# Patient Record
Sex: Female | Born: 1935 | Race: White | Hispanic: No | Smoking: Never smoker
Health system: Southern US, Community
[De-identification: ages and names within clinical notes are randomized; demographics above are authoritative.]

## PROBLEM LIST (undated history)

## (undated) DIAGNOSIS — J8410 Pulmonary fibrosis, unspecified: Secondary | ICD-10-CM

## (undated) DIAGNOSIS — F32A Depression, unspecified: Secondary | ICD-10-CM

## (undated) DIAGNOSIS — R51 Headache: Secondary | ICD-10-CM

## (undated) DIAGNOSIS — N183 Chronic kidney disease, stage 3 unspecified: Secondary | ICD-10-CM

## (undated) DIAGNOSIS — F0390 Unspecified dementia without behavioral disturbance: Secondary | ICD-10-CM

## (undated) DIAGNOSIS — E039 Hypothyroidism, unspecified: Secondary | ICD-10-CM

## (undated) DIAGNOSIS — E559 Vitamin D deficiency, unspecified: Secondary | ICD-10-CM

## (undated) DIAGNOSIS — R519 Headache, unspecified: Secondary | ICD-10-CM

## (undated) DIAGNOSIS — E538 Deficiency of other specified B group vitamins: Secondary | ICD-10-CM

## (undated) DIAGNOSIS — T7840XA Allergy, unspecified, initial encounter: Secondary | ICD-10-CM

## (undated) DIAGNOSIS — I10 Essential (primary) hypertension: Secondary | ICD-10-CM

## (undated) DIAGNOSIS — F329 Major depressive disorder, single episode, unspecified: Secondary | ICD-10-CM

## (undated) DIAGNOSIS — J449 Chronic obstructive pulmonary disease, unspecified: Secondary | ICD-10-CM

## (undated) DIAGNOSIS — G8929 Other chronic pain: Secondary | ICD-10-CM

## (undated) DIAGNOSIS — E785 Hyperlipidemia, unspecified: Secondary | ICD-10-CM

## (undated) HISTORY — DX: Headache, unspecified: R51.9

## (undated) HISTORY — DX: Unspecified dementia, unspecified severity, without behavioral disturbance, psychotic disturbance, mood disturbance, and anxiety: F03.90

## (undated) HISTORY — PX: APPENDECTOMY: SHX54

## (undated) HISTORY — DX: Chronic kidney disease, stage 3 (moderate): N18.3

## (undated) HISTORY — DX: Chronic obstructive pulmonary disease, unspecified: J44.9

## (undated) HISTORY — DX: Deficiency of other specified B group vitamins: E53.8

## (undated) HISTORY — DX: Hypothyroidism, unspecified: E03.9

## (undated) HISTORY — DX: Major depressive disorder, single episode, unspecified: F32.9

## (undated) HISTORY — DX: Essential (primary) hypertension: I10

## (undated) HISTORY — DX: Depression, unspecified: F32.A

## (undated) HISTORY — PX: THYROIDECTOMY: SHX17

## (undated) HISTORY — PX: TUBAL LIGATION: SHX77

## (undated) HISTORY — DX: Vitamin D deficiency, unspecified: E55.9

## (undated) HISTORY — DX: Chronic kidney disease, stage 3 unspecified: N18.30

## (undated) HISTORY — DX: Other chronic pain: G89.29

## (undated) HISTORY — DX: Pulmonary fibrosis, unspecified: J84.10

## (undated) HISTORY — DX: Headache: R51

## (undated) HISTORY — DX: Hyperlipidemia, unspecified: E78.5

## (undated) HISTORY — PX: BREAST BIOPSY: SHX20

## (undated) HISTORY — DX: Allergy, unspecified, initial encounter: T78.40XA

---

## 1996-01-30 HISTORY — PX: ENDOMETRIAL BIOPSY: SHX622

## 1997-05-12 ENCOUNTER — Other Ambulatory Visit: Admission: RE | Admit: 1997-05-12 | Discharge: 1997-05-12 | Payer: Self-pay | Admitting: *Deleted

## 1997-05-12 ENCOUNTER — Other Ambulatory Visit: Admission: RE | Admit: 1997-05-12 | Discharge: 1997-05-12 | Payer: Self-pay | Admitting: Internal Medicine

## 1998-02-25 ENCOUNTER — Encounter: Payer: Self-pay | Admitting: Urology

## 1998-02-25 ENCOUNTER — Ambulatory Visit (HOSPITAL_COMMUNITY): Admission: RE | Admit: 1998-02-25 | Discharge: 1998-02-25 | Payer: Self-pay | Admitting: Urology

## 1998-03-11 ENCOUNTER — Ambulatory Visit (HOSPITAL_COMMUNITY): Admission: RE | Admit: 1998-03-11 | Discharge: 1998-03-11 | Payer: Self-pay | Admitting: Neurology

## 1998-03-11 ENCOUNTER — Encounter: Payer: Self-pay | Admitting: Neurology

## 1998-04-05 ENCOUNTER — Ambulatory Visit: Admission: RE | Admit: 1998-04-05 | Discharge: 1998-04-05 | Payer: Self-pay | Admitting: Gynecology

## 1998-06-13 ENCOUNTER — Ambulatory Visit (HOSPITAL_COMMUNITY): Admission: RE | Admit: 1998-06-13 | Discharge: 1998-06-13 | Payer: Self-pay | Admitting: Internal Medicine

## 1998-06-13 ENCOUNTER — Encounter: Payer: Self-pay | Admitting: Internal Medicine

## 1998-06-15 ENCOUNTER — Ambulatory Visit (HOSPITAL_COMMUNITY): Admission: RE | Admit: 1998-06-15 | Discharge: 1998-06-15 | Payer: Self-pay | Admitting: Internal Medicine

## 1998-06-15 ENCOUNTER — Encounter: Payer: Self-pay | Admitting: Internal Medicine

## 1999-06-15 ENCOUNTER — Encounter: Payer: Self-pay | Admitting: Internal Medicine

## 1999-06-15 ENCOUNTER — Encounter: Admission: RE | Admit: 1999-06-15 | Discharge: 1999-06-15 | Payer: Self-pay

## 2000-05-26 ENCOUNTER — Inpatient Hospital Stay (HOSPITAL_COMMUNITY): Admission: EM | Admit: 2000-05-26 | Discharge: 2000-06-05 | Payer: Self-pay | Admitting: Emergency Medicine

## 2000-05-26 ENCOUNTER — Encounter: Payer: Self-pay | Admitting: Emergency Medicine

## 2000-08-08 ENCOUNTER — Ambulatory Visit (HOSPITAL_COMMUNITY): Admission: RE | Admit: 2000-08-08 | Discharge: 2000-08-08 | Payer: Self-pay | Admitting: Internal Medicine

## 2000-08-08 ENCOUNTER — Encounter: Payer: Self-pay | Admitting: Internal Medicine

## 2000-12-28 ENCOUNTER — Encounter: Payer: Self-pay | Admitting: Emergency Medicine

## 2000-12-28 ENCOUNTER — Emergency Department (HOSPITAL_COMMUNITY): Admission: EM | Admit: 2000-12-28 | Discharge: 2000-12-28 | Payer: Self-pay | Admitting: Emergency Medicine

## 2001-04-28 ENCOUNTER — Encounter: Payer: Self-pay | Admitting: Internal Medicine

## 2001-04-28 ENCOUNTER — Ambulatory Visit (HOSPITAL_COMMUNITY): Admission: RE | Admit: 2001-04-28 | Discharge: 2001-04-28 | Payer: Self-pay | Admitting: Internal Medicine

## 2001-05-08 ENCOUNTER — Ambulatory Visit (HOSPITAL_COMMUNITY): Admission: RE | Admit: 2001-05-08 | Discharge: 2001-05-08 | Payer: Self-pay | Admitting: Internal Medicine

## 2001-05-08 ENCOUNTER — Encounter: Payer: Self-pay | Admitting: Internal Medicine

## 2001-06-18 ENCOUNTER — Other Ambulatory Visit: Admission: RE | Admit: 2001-06-18 | Discharge: 2001-06-18 | Payer: Self-pay | Admitting: Internal Medicine

## 2001-08-19 ENCOUNTER — Encounter: Payer: Self-pay | Admitting: Internal Medicine

## 2001-08-19 ENCOUNTER — Ambulatory Visit (HOSPITAL_COMMUNITY): Admission: RE | Admit: 2001-08-19 | Discharge: 2001-08-19 | Payer: Self-pay | Admitting: Internal Medicine

## 2002-01-06 ENCOUNTER — Ambulatory Visit (HOSPITAL_COMMUNITY): Admission: RE | Admit: 2002-01-06 | Discharge: 2002-01-06 | Payer: Self-pay | Admitting: Internal Medicine

## 2002-01-06 ENCOUNTER — Encounter: Payer: Self-pay | Admitting: Internal Medicine

## 2002-03-24 ENCOUNTER — Encounter: Admission: RE | Admit: 2002-03-24 | Discharge: 2002-03-24 | Payer: Self-pay

## 2002-03-24 ENCOUNTER — Encounter: Payer: Self-pay | Admitting: Internal Medicine

## 2002-04-21 ENCOUNTER — Encounter: Payer: Self-pay | Admitting: Internal Medicine

## 2002-04-21 ENCOUNTER — Ambulatory Visit (HOSPITAL_COMMUNITY): Admission: RE | Admit: 2002-04-21 | Discharge: 2002-04-21 | Payer: Self-pay | Admitting: Internal Medicine

## 2002-09-26 ENCOUNTER — Encounter: Payer: Self-pay | Admitting: Emergency Medicine

## 2002-09-26 ENCOUNTER — Emergency Department (HOSPITAL_COMMUNITY): Admission: EM | Admit: 2002-09-26 | Discharge: 2002-09-26 | Payer: Self-pay | Admitting: Emergency Medicine

## 2002-09-27 ENCOUNTER — Inpatient Hospital Stay (HOSPITAL_COMMUNITY): Admission: RE | Admit: 2002-09-27 | Discharge: 2002-10-01 | Payer: Self-pay | Admitting: Gastroenterology

## 2002-09-28 ENCOUNTER — Encounter: Payer: Self-pay | Admitting: Internal Medicine

## 2002-09-28 ENCOUNTER — Encounter: Payer: Self-pay | Admitting: Gastroenterology

## 2002-09-29 ENCOUNTER — Encounter: Payer: Self-pay | Admitting: Internal Medicine

## 2002-09-30 ENCOUNTER — Encounter: Payer: Self-pay | Admitting: Gastroenterology

## 2002-10-01 ENCOUNTER — Encounter (INDEPENDENT_AMBULATORY_CARE_PROVIDER_SITE_OTHER): Payer: Self-pay | Admitting: Specialist

## 2002-10-01 DIAGNOSIS — B3781 Candidal esophagitis: Secondary | ICD-10-CM | POA: Insufficient documentation

## 2002-11-25 ENCOUNTER — Ambulatory Visit (HOSPITAL_COMMUNITY): Admission: RE | Admit: 2002-11-25 | Discharge: 2002-11-25 | Payer: Self-pay | Admitting: Internal Medicine

## 2002-11-25 ENCOUNTER — Encounter: Payer: Self-pay | Admitting: Gastroenterology

## 2002-11-25 ENCOUNTER — Encounter (INDEPENDENT_AMBULATORY_CARE_PROVIDER_SITE_OTHER): Payer: Self-pay | Admitting: Specialist

## 2003-04-20 ENCOUNTER — Ambulatory Visit (HOSPITAL_COMMUNITY): Admission: RE | Admit: 2003-04-20 | Discharge: 2003-04-20 | Payer: Self-pay | Admitting: Internal Medicine

## 2004-01-28 ENCOUNTER — Ambulatory Visit: Payer: Self-pay

## 2004-04-21 ENCOUNTER — Ambulatory Visit (HOSPITAL_COMMUNITY): Admission: RE | Admit: 2004-04-21 | Discharge: 2004-04-21 | Payer: Self-pay | Admitting: Internal Medicine

## 2004-05-22 ENCOUNTER — Ambulatory Visit: Payer: Self-pay | Admitting: Internal Medicine

## 2004-06-05 ENCOUNTER — Ambulatory Visit: Payer: Self-pay | Admitting: Internal Medicine

## 2004-06-07 ENCOUNTER — Encounter (INDEPENDENT_AMBULATORY_CARE_PROVIDER_SITE_OTHER): Payer: Self-pay | Admitting: *Deleted

## 2004-06-07 ENCOUNTER — Inpatient Hospital Stay (HOSPITAL_COMMUNITY): Admission: EM | Admit: 2004-06-07 | Discharge: 2004-06-09 | Payer: Self-pay | Admitting: Emergency Medicine

## 2004-06-08 ENCOUNTER — Ambulatory Visit: Payer: Self-pay | Admitting: Internal Medicine

## 2004-07-05 ENCOUNTER — Ambulatory Visit: Payer: Self-pay | Admitting: Internal Medicine

## 2004-08-04 ENCOUNTER — Ambulatory Visit (HOSPITAL_COMMUNITY): Admission: RE | Admit: 2004-08-04 | Discharge: 2004-08-04 | Payer: Self-pay | Admitting: Internal Medicine

## 2004-10-12 ENCOUNTER — Encounter (INDEPENDENT_AMBULATORY_CARE_PROVIDER_SITE_OTHER): Payer: Self-pay | Admitting: *Deleted

## 2004-10-12 ENCOUNTER — Ambulatory Visit (HOSPITAL_COMMUNITY): Admission: RE | Admit: 2004-10-12 | Discharge: 2004-10-12 | Payer: Self-pay | Admitting: Neurology

## 2004-12-08 ENCOUNTER — Emergency Department (HOSPITAL_COMMUNITY): Admission: EM | Admit: 2004-12-08 | Discharge: 2004-12-08 | Payer: Self-pay | Admitting: Emergency Medicine

## 2004-12-19 ENCOUNTER — Ambulatory Visit (HOSPITAL_COMMUNITY): Admission: RE | Admit: 2004-12-19 | Discharge: 2004-12-19 | Payer: Self-pay | Admitting: Internal Medicine

## 2005-02-11 ENCOUNTER — Emergency Department (HOSPITAL_COMMUNITY): Admission: EM | Admit: 2005-02-11 | Discharge: 2005-02-12 | Payer: Self-pay | Admitting: Emergency Medicine

## 2005-04-24 ENCOUNTER — Ambulatory Visit (HOSPITAL_COMMUNITY): Admission: RE | Admit: 2005-04-24 | Discharge: 2005-04-24 | Payer: Self-pay | Admitting: Internal Medicine

## 2005-05-10 ENCOUNTER — Encounter: Admission: RE | Admit: 2005-05-10 | Discharge: 2005-05-10 | Payer: Self-pay | Admitting: Internal Medicine

## 2005-06-05 ENCOUNTER — Ambulatory Visit: Payer: Self-pay | Admitting: Internal Medicine

## 2005-08-31 ENCOUNTER — Other Ambulatory Visit: Admission: RE | Admit: 2005-08-31 | Discharge: 2005-08-31 | Payer: Self-pay | Admitting: Internal Medicine

## 2005-12-03 ENCOUNTER — Ambulatory Visit (HOSPITAL_COMMUNITY): Admission: RE | Admit: 2005-12-03 | Discharge: 2005-12-03 | Payer: Self-pay | Admitting: Internal Medicine

## 2006-02-01 LAB — HM COLONOSCOPY

## 2006-06-05 ENCOUNTER — Ambulatory Visit (HOSPITAL_COMMUNITY): Admission: RE | Admit: 2006-06-05 | Discharge: 2006-06-05 | Payer: Self-pay | Admitting: Obstetrics

## 2006-06-20 ENCOUNTER — Ambulatory Visit: Payer: Self-pay | Admitting: Internal Medicine

## 2006-07-29 ENCOUNTER — Ambulatory Visit: Payer: Self-pay | Admitting: Internal Medicine

## 2006-12-20 ENCOUNTER — Ambulatory Visit: Payer: Self-pay | Admitting: Internal Medicine

## 2007-01-03 ENCOUNTER — Encounter: Payer: Self-pay | Admitting: Internal Medicine

## 2007-01-03 ENCOUNTER — Ambulatory Visit: Payer: Self-pay | Admitting: Internal Medicine

## 2007-01-03 DIAGNOSIS — K5730 Diverticulosis of large intestine without perforation or abscess without bleeding: Secondary | ICD-10-CM | POA: Insufficient documentation

## 2007-01-03 DIAGNOSIS — K620 Anal polyp: Secondary | ICD-10-CM | POA: Insufficient documentation

## 2007-01-03 DIAGNOSIS — K621 Rectal polyp: Secondary | ICD-10-CM

## 2007-01-03 LAB — HM COLONOSCOPY

## 2007-01-10 ENCOUNTER — Ambulatory Visit: Payer: Self-pay | Admitting: Gastroenterology

## 2007-01-10 LAB — CONVERTED CEMR LAB
Basophils Absolute: 0 10*3/uL (ref 0.0–0.1)
Eosinophils Relative: 3.3 % (ref 0.0–5.0)
HCT: 32.5 % — ABNORMAL LOW (ref 36.0–46.0)
Neutrophils Relative %: 48.7 % (ref 43.0–77.0)
RBC: 3.59 M/uL — ABNORMAL LOW (ref 3.87–5.11)
RDW: 12 % (ref 11.5–14.6)
WBC: 4.7 10*3/uL (ref 4.5–10.5)

## 2007-01-24 DIAGNOSIS — K6289 Other specified diseases of anus and rectum: Secondary | ICD-10-CM | POA: Insufficient documentation

## 2007-01-24 DIAGNOSIS — E782 Mixed hyperlipidemia: Secondary | ICD-10-CM

## 2007-01-24 DIAGNOSIS — K219 Gastro-esophageal reflux disease without esophagitis: Secondary | ICD-10-CM

## 2007-01-24 DIAGNOSIS — M216X9 Other acquired deformities of unspecified foot: Secondary | ICD-10-CM

## 2007-01-24 DIAGNOSIS — K589 Irritable bowel syndrome without diarrhea: Secondary | ICD-10-CM

## 2007-01-24 DIAGNOSIS — E039 Hypothyroidism, unspecified: Secondary | ICD-10-CM

## 2007-01-24 DIAGNOSIS — D34 Benign neoplasm of thyroid gland: Secondary | ICD-10-CM | POA: Insufficient documentation

## 2007-01-24 DIAGNOSIS — D012 Carcinoma in situ of rectum: Secondary | ICD-10-CM

## 2007-01-24 DIAGNOSIS — J45909 Unspecified asthma, uncomplicated: Secondary | ICD-10-CM | POA: Insufficient documentation

## 2007-01-24 DIAGNOSIS — I10 Essential (primary) hypertension: Secondary | ICD-10-CM

## 2007-02-06 ENCOUNTER — Ambulatory Visit: Payer: Self-pay | Admitting: Internal Medicine

## 2007-03-15 ENCOUNTER — Emergency Department (HOSPITAL_COMMUNITY): Admission: EM | Admit: 2007-03-15 | Discharge: 2007-03-15 | Payer: Self-pay | Admitting: Emergency Medicine

## 2007-03-15 ENCOUNTER — Encounter: Payer: Self-pay | Admitting: Critical Care Medicine

## 2007-03-23 ENCOUNTER — Emergency Department (HOSPITAL_COMMUNITY): Admission: EM | Admit: 2007-03-23 | Discharge: 2007-03-23 | Payer: Self-pay | Admitting: Emergency Medicine

## 2007-04-07 ENCOUNTER — Encounter: Payer: Self-pay | Admitting: Critical Care Medicine

## 2007-04-14 ENCOUNTER — Ambulatory Visit: Payer: Self-pay | Admitting: Critical Care Medicine

## 2007-04-14 ENCOUNTER — Encounter: Payer: Self-pay | Admitting: Critical Care Medicine

## 2007-04-14 ENCOUNTER — Inpatient Hospital Stay (HOSPITAL_COMMUNITY): Admission: AD | Admit: 2007-04-14 | Discharge: 2007-04-18 | Payer: Self-pay | Admitting: Critical Care Medicine

## 2007-04-14 DIAGNOSIS — J8410 Pulmonary fibrosis, unspecified: Secondary | ICD-10-CM

## 2007-04-15 ENCOUNTER — Encounter: Payer: Self-pay | Admitting: Critical Care Medicine

## 2007-04-16 ENCOUNTER — Encounter: Payer: Self-pay | Admitting: Critical Care Medicine

## 2007-04-16 ENCOUNTER — Telehealth: Payer: Self-pay | Admitting: Critical Care Medicine

## 2007-04-17 ENCOUNTER — Encounter: Payer: Self-pay | Admitting: Critical Care Medicine

## 2007-04-17 ENCOUNTER — Ambulatory Visit: Payer: Self-pay | Admitting: Gastroenterology

## 2007-04-18 ENCOUNTER — Telehealth (INDEPENDENT_AMBULATORY_CARE_PROVIDER_SITE_OTHER): Payer: Self-pay | Admitting: *Deleted

## 2007-04-24 ENCOUNTER — Telehealth: Payer: Self-pay | Admitting: Critical Care Medicine

## 2007-04-24 ENCOUNTER — Ambulatory Visit: Payer: Self-pay | Admitting: Critical Care Medicine

## 2007-04-24 DIAGNOSIS — E230 Hypopituitarism: Secondary | ICD-10-CM | POA: Insufficient documentation

## 2007-04-24 LAB — CONVERTED CEMR LAB
Calcium: 8.9 mg/dL (ref 8.4–10.5)
Creatinine, Ser: 0.6 mg/dL (ref 0.4–1.2)
GFR calc Af Amer: 125 mL/min
Glucose, Bld: 160 mg/dL — ABNORMAL HIGH (ref 70–99)
HCT: 36 % (ref 36.0–46.0)
Hemoglobin: 11.7 g/dL — ABNORMAL LOW (ref 12.0–15.0)
MCHC: 32.6 g/dL (ref 30.0–36.0)
Monocytes Absolute: 0.5 10*3/uL (ref 0.2–0.7)
Monocytes Relative: 7.1 % (ref 3.0–11.0)
Neutro Abs: 6.2 10*3/uL (ref 1.4–7.7)
RDW: 14.3 % (ref 11.5–14.6)
Sodium: 137 meq/L (ref 135–145)

## 2007-04-25 ENCOUNTER — Telehealth (INDEPENDENT_AMBULATORY_CARE_PROVIDER_SITE_OTHER): Payer: Self-pay | Admitting: *Deleted

## 2007-05-01 ENCOUNTER — Ambulatory Visit: Payer: Self-pay | Admitting: Endocrinology

## 2007-05-01 DIAGNOSIS — E236 Other disorders of pituitary gland: Secondary | ICD-10-CM | POA: Insufficient documentation

## 2007-05-12 ENCOUNTER — Encounter: Payer: Self-pay | Admitting: Endocrinology

## 2007-05-14 ENCOUNTER — Ambulatory Visit: Payer: Self-pay | Admitting: Critical Care Medicine

## 2007-05-19 ENCOUNTER — Encounter: Payer: Self-pay | Admitting: Critical Care Medicine

## 2007-05-22 ENCOUNTER — Ambulatory Visit: Payer: Self-pay | Admitting: Endocrinology

## 2007-06-18 ENCOUNTER — Ambulatory Visit: Payer: Self-pay | Admitting: Critical Care Medicine

## 2007-06-18 ENCOUNTER — Ambulatory Visit: Payer: Self-pay | Admitting: Endocrinology

## 2007-06-30 ENCOUNTER — Ambulatory Visit (HOSPITAL_COMMUNITY): Admission: RE | Admit: 2007-06-30 | Discharge: 2007-06-30 | Payer: Self-pay | Admitting: Internal Medicine

## 2007-08-06 ENCOUNTER — Ambulatory Visit: Payer: Self-pay | Admitting: Endocrinology

## 2007-08-06 LAB — CONVERTED CEMR LAB: Cortisol, Plasma: 8.2 ug/dL

## 2007-08-11 ENCOUNTER — Encounter: Payer: Self-pay | Admitting: Endocrinology

## 2007-08-11 ENCOUNTER — Telehealth (INDEPENDENT_AMBULATORY_CARE_PROVIDER_SITE_OTHER): Payer: Self-pay | Admitting: *Deleted

## 2007-08-11 ENCOUNTER — Telehealth: Payer: Self-pay | Admitting: Endocrinology

## 2007-08-18 ENCOUNTER — Telehealth: Payer: Self-pay | Admitting: Critical Care Medicine

## 2007-08-19 ENCOUNTER — Ambulatory Visit: Payer: Self-pay | Admitting: Critical Care Medicine

## 2007-10-14 ENCOUNTER — Ambulatory Visit: Payer: Self-pay | Admitting: Endocrinology

## 2007-10-14 LAB — CONVERTED CEMR LAB
BUN: 17 mg/dL (ref 6–23)
Calcium: 9 mg/dL (ref 8.4–10.5)
Creatinine, Ser: 0.7 mg/dL (ref 0.4–1.2)
GFR calc Af Amer: 105 mL/min
Glucose, Bld: 109 mg/dL — ABNORMAL HIGH (ref 70–99)
Sodium: 144 meq/L (ref 135–145)

## 2007-10-17 ENCOUNTER — Encounter: Admission: RE | Admit: 2007-10-17 | Discharge: 2007-10-17 | Payer: Self-pay | Admitting: Endocrinology

## 2007-12-10 ENCOUNTER — Ambulatory Visit: Payer: Self-pay | Admitting: Critical Care Medicine

## 2008-06-30 ENCOUNTER — Ambulatory Visit (HOSPITAL_COMMUNITY): Admission: RE | Admit: 2008-06-30 | Discharge: 2008-06-30 | Payer: Self-pay | Admitting: Internal Medicine

## 2008-07-28 ENCOUNTER — Encounter: Admission: RE | Admit: 2008-07-28 | Discharge: 2008-07-28 | Payer: Self-pay | Admitting: Internal Medicine

## 2008-07-28 ENCOUNTER — Encounter: Payer: Self-pay | Admitting: Internal Medicine

## 2008-08-11 ENCOUNTER — Ambulatory Visit: Payer: Self-pay | Admitting: Internal Medicine

## 2008-08-11 ENCOUNTER — Encounter (INDEPENDENT_AMBULATORY_CARE_PROVIDER_SITE_OTHER): Payer: Self-pay | Admitting: *Deleted

## 2008-08-11 ENCOUNTER — Inpatient Hospital Stay (HOSPITAL_COMMUNITY): Admission: EM | Admit: 2008-08-11 | Discharge: 2008-08-13 | Payer: Self-pay | Admitting: Emergency Medicine

## 2008-08-12 ENCOUNTER — Encounter: Payer: Self-pay | Admitting: Internal Medicine

## 2008-08-30 ENCOUNTER — Telehealth: Payer: Self-pay | Admitting: Internal Medicine

## 2008-08-30 ENCOUNTER — Telehealth (INDEPENDENT_AMBULATORY_CARE_PROVIDER_SITE_OTHER): Payer: Self-pay | Admitting: *Deleted

## 2008-09-17 ENCOUNTER — Ambulatory Visit: Payer: Self-pay | Admitting: Internal Medicine

## 2009-04-19 ENCOUNTER — Other Ambulatory Visit: Admission: RE | Admit: 2009-04-19 | Discharge: 2009-04-19 | Payer: Self-pay | Admitting: Internal Medicine

## 2009-07-05 ENCOUNTER — Ambulatory Visit (HOSPITAL_COMMUNITY): Admission: RE | Admit: 2009-07-05 | Discharge: 2009-07-05 | Payer: Self-pay | Admitting: Internal Medicine

## 2009-09-26 ENCOUNTER — Encounter: Admission: RE | Admit: 2009-09-26 | Discharge: 2009-09-26 | Payer: Self-pay | Admitting: Neurology

## 2010-01-29 HISTORY — PX: ESOPHAGOGASTRODUODENOSCOPY: SHX1529

## 2010-02-19 ENCOUNTER — Encounter: Payer: Self-pay | Admitting: Internal Medicine

## 2010-03-29 ENCOUNTER — Telehealth: Payer: Self-pay | Admitting: Internal Medicine

## 2010-03-30 ENCOUNTER — Ambulatory Visit: Payer: Self-pay | Admitting: Nurse Practitioner

## 2010-03-30 ENCOUNTER — Encounter: Payer: Self-pay | Admitting: Nurse Practitioner

## 2010-03-30 ENCOUNTER — Ambulatory Visit (INDEPENDENT_AMBULATORY_CARE_PROVIDER_SITE_OTHER): Payer: 59 | Admitting: Nurse Practitioner

## 2010-03-30 DIAGNOSIS — K5730 Diverticulosis of large intestine without perforation or abscess without bleeding: Secondary | ICD-10-CM

## 2010-03-30 DIAGNOSIS — K589 Irritable bowel syndrome without diarrhea: Secondary | ICD-10-CM

## 2010-03-30 DIAGNOSIS — D012 Carcinoma in situ of rectum: Secondary | ICD-10-CM

## 2010-03-30 DIAGNOSIS — K5900 Constipation, unspecified: Secondary | ICD-10-CM | POA: Insufficient documentation

## 2010-04-04 ENCOUNTER — Encounter: Payer: Self-pay | Admitting: Internal Medicine

## 2010-04-05 ENCOUNTER — Telehealth: Payer: Self-pay | Admitting: Internal Medicine

## 2010-04-06 ENCOUNTER — Encounter: Payer: Self-pay | Admitting: Internal Medicine

## 2010-04-06 NOTE — Progress Notes (Signed)
Summary: Triage   Phone Note Call from Patient Call back at Home Phone [phone]   Caller: Patient Call For: Dr. Juanda Chance Reason for Call: Talk to Nurse Summary of Call: Has an appt. on 04-17-10 and requesting sooner appt. Abd pain, said she has an intestinal blockage and its getting worse Initial call taken by: Karna Christmas,  March 29, 2010 11:11 AM  Follow-up for Phone Call        Patient calling to report abdominal pain for the last 2-3 weeks that is getting progessively worse. Patient states the pain is on her left side. Denies nausea, vomiting. Patient states when she goes to the bathroom, she has hard "balls" of stool. She is taking her Dexilant daily. She is not taking Prilosec. 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 2008- diverticular disease, last EGD 08/12/08- chronic gastritis. Patient has an appointment with Dr. Juanda Chance on 04/17/10 for her check up but would like to be seen earlier. Scheduled patient with Willette Cluster, RNP on 03/30/10 at 11:00 AM. Follow-up by: Jesse Fall RN,  March 29, 2010 11:37 AM  Additional Follow-up for Phone Call Additional follow up Details #1::        reviewed and agree. I know pt well. Chronic constipation , frail lady, does not do well with sigmoidoscopies. She will likely need a more vigorous laxative regimen, besides Mag Oxide pills. Additional Follow-up by: Hart Carwin MD,  March 29, 2010 6:37 PM

## 2010-04-11 NOTE — Assessment & Plan Note (Addendum)
Summary: Abdominal pain & constipation    History of Present Illness Visit Type: Follow-up Visit Primary GI MD: Lina Sar MD Primary Provider:  Lucky Cowboy, MD Requesting Provider: n/a Chief Complaint: LLQ pain and constipation constantly worsening symptoms since hospital visit History of Present Illness:    Patient is 77 year old white female known to Dr. Juanda Chance for a history of severe diverticular disease and a carcinoma in situ in a rectal polyp  in 2004.  Her last surveillance colonoscopy was in December 2008 with findings of tubulovillousmadenomatous rectal polyp. There was no high grade dysplasia.    Ms. Spurgin comes in with her son for evaluation of progressive constipation and left sided abdominal pain. It is uncomfortable to lay on left side. Her stools are hard little balls. She describes passage of very dark stool a couple of times over the last month. No nausea or  vomiting. Appetite and weight are fine.      GI Review of Systems    Reports abdominal pain.     Location of  Abdominal pain: LLQ.    Denies acid reflux, belching, bloating, chest pain, dysphagia with liquids, dysphagia with solids, heartburn, loss of appetite, nausea, vomiting, vomiting blood, weight loss, and  weight gain.      Reports black tarry stools and  constipation.     Denies anal fissure, change in bowel habit, diarrhea, diverticulosis, fecal incontinence, heme positive stool, hemorrhoids, irritable bowel syndrome, jaundice, light color stool, liver problems, rectal bleeding, and  rectal pain.    Current Medications (verified): 1)  Levoxyl 75 Mcg Tabs (Levothyroxine Sodium) .... Take 1 Tablet By Mouth Once Daily 2)  Verapamil Hcl Cr 120 Mg  Cp24 (Verapamil Hcl) .... Take 1/2 Two Times A Day 3)  Vitamin D 2000 Unit  Tabs (Cholecalciferol) .... Two Times A Day 4)  Fortical 200 Unit/act Nasal Soln (Calcitonin (Salmon)) .Marland Kitchen.. 1 Spray in 1 Nostril Daily 5)  Centrum Silver  Tabs (Multiple  Vitamins-Minerals) .Marland Kitchen.. 1 By Mouth Daily 6)  Cobal-1000 1000 Mcg/ml Soln (Cyanocobalamin) .Marland Kitchen.. 1 Injection Monthly 7)  Magnesium Oxide 400 Mg Tabs (Magnesium Oxide) .Marland Kitchen.. 1 By Mouth Three Times A Day 8)  Aricept 5 Mg Tabs (Donepezil Hcl) .... Take 1/2 Tablet At Bedtime 9)  Flax Seed Oil 1000 Mg Caps (Flaxseed (Linseed)) .... Once Daily 10)  Crestor 5 Mg Tabs (Rosuvastatin Calcium) .... Take 1 Tablet By Mouth Once Daily 11)  Benefiber  Powd (Wheat Dextrin) .... As Directed 12)  Citalopram Hydrobromide 40 Mg Tabs (Citalopram Hydrobromide) .... Take /12 Tablet By Mouth Once Daily 13)  Loratadine-Pseudoephedrine 10-240 Mg Xr24h-Tab (Loratadine-Pseudoephedrine) .... Once Daily  Allergies (verified): 1)  ! Codeine 2)  ! * Mobic 3)  ! Demerol  Past History:  Past Medical History: Asthma Diabetes, Type 2 Hyperlipidemia chronic headaches Hypertension Pulmonary fibrosis/chronic granulomatous changes on CT Chest , minimal mediastinal LAN 3/09 Carcinoma in situ in colon polyp 2004  Past Surgical History: Reviewed history from 09/14/2008 and no changes required. Appendectomy thyroidectomy Tubal Ligation Right Breast Biopsie-Benign  Family History: Reviewed history from 09/17/2008 and no changes required. Family History Asthma Family History Breast Cancer: Brother, Sister Family History COPD  Family History Lung Cancer Family History MI/Heart Attack brother expired of leukemia Family History of Diabetes: Sisters x 2, Mother, Maternal Aunt No FH of Colon Cancer:  Social History: Reviewed history from 09/14/2008 and no changes required. 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  The patient denies anemia, anxiety-new, back pain, blood in urine, breast changes/lumps, change in vision, cough, coughing up blood, depression-new,  fainting, fever, headaches-new, hearing problems, heart murmur, heart rhythm changes, itching, menstrual pain, muscle pains/cramps, night sweats, nosebleeds, pregnancy symptoms, shortness of breath, skin rash, sleeping problems, sore throat, swelling of feet/legs, swollen lymph glands, thirst - excessive , urination - excessive , urination changes/pain, urine leakage, vision changes, and voice change.    Vital Signs:  Patient profile:   77 year old female Height:      66 inches Weight:      138.25 pounds BMI:     22.39 Pulse rate:   72 / minute Pulse rhythm:   regular BP sitting:   158 / 72  (left arm) Cuff size:   regular  Vitals Entered By: June McMurray CMA Duncan Dull) (March 30, 2010 12:06 PM)  Physical Exam  General:  Thin, white female in no acute distress Head:  Normocephalic and atraumatic. Eyes:  Conjunctiva pink, no icterus.  Neck:  Conjunctiva pink, no icterus.  Lungs:  Clear throughout to auscultation. Heart:  RRR Abdomen:  Abdomen is soft, flat, normoactive bowel sounds. Mild LLQ tenderness. No masses felt.  Rectal:  No stool in vault. No masses felt. Gloved finger is heme negative. Msk:  Symmetrical with no gross deformities. Normal posture. Extremities:  1+ bilateral lower extremity edema. Neurologic:  Alert and  oriented x4;  grossly normal neurologically. Skin:  Intact without significant lesions or rashes. Cervical Nodes:  No significant cervical adenopathy. Psych:  Alert and cooperative. Normal mood and affect.   Impression & Recommendations:  Problem # 1:  IRRITABLE BOWEL SYNDROME (ICD-564.1) Assessment Deteriorated Problems with constipation as well as diarrhea in the past. She is currently constipated with LLQ discomfort and I suspect these are part of her IBS.Will do a slow bowel purge and see if LLQ discomfort resolves. If not, then will need further workup. Ms. Treadwell looks okay, her abdominal exam is not overly concerning. Once bowels are purged patient will  call with condition update.   Problem # 2:  DIVERTICULOSIS OF COLON (ICD-562.10) Assessment: Comment Only Severe.  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 Recurrent rectal polyp (tubulovillous adenoma without high grade dysplasia) in 2008. She is now due for repeat flexibe sigmoidoscopy but patient doesn't feel physically up to it right now.. She and Dr. Juanda Chance can revisit this when patient comes for recheck in a couple of weeks.  Patient Instructions: 1)  Please drink lots of water daily (several glasses). 2)  Please start Miralax 2 doses twice a day until you have loose stools then back down to once daily.  3)  Keep your appointment with Dr Juanda Chance on 04/17/10 2:15 pm. 4)  The medication list was reviewed and reconciled.  All changed / newly prescribed medications were explained.  A complete medication list was provided to the patient / caregiver.

## 2010-04-11 NOTE — Progress Notes (Addendum)
Summary: Abd Pain  Medications Added CIPROFLOXACIN HCL 250 MG TABS (CIPROFLOXACIN HCL) Take one tablet by mouth two times a day       Phone Note Call from Patient Call back at [phone] Work # for Daughter   Caller: KATRINA -Daughter Call For: Dr Juanda Chance Summary of Call: Is still having Abd Pain. Was told to call back if it had not subsided. Initial call taken by: Leanor Kail [facility],  April 05, 2010 8:31 AM  Follow-up for Phone Call        Spoke with patient. She saw Willette Cluster, RNP on 03/30/10  for abdominal pain and constipation. She is calling to report that she is still having LLQ abdominal pain. States it is a "bad ache that sometimes gets worse." States moving a certain way increases the pain. She cannot sleep on her left side. She is taking the Miralax once daily with results. She has 1-2 bowel movements/day that are not diarrhea but not real solid. She states sometimes the stool is a small amount and sometimes a larger amount. Denies fever, nausea or vomiting.  HX diverticular disease, carcinoma in situ in rectal poylp-2004. Last colon- Dec. 2008. Please, advise.  Follow-up by: Jesse Fall RN,  April 05, 2010 9:10 AM  Additional Follow-up for Phone Call Additional follow up Details #1::        Please call in Cipppppro 250 mg by mouth two times a day, #14, 1 by mouth two times a day. then call with an update. Additional Follow-up by: Hart Carwin MD,  April 05, 2010 9:51 PM    Additional Follow-up for Phone Call Additional follow up Details #2::    Pt.'s daughter returned call and said her mother could be reached on her cell [phone] or at [phone] Follow-up by: Karna Christmas,  April 06, 2010 8:40 AM  Additional Follow-up for Phone Call Additional follow up Details #3:: Details for Additional Follow-up Action Taken: Spoke with patient and gave her Dr.Ferdinando Lodge's recommendations. Rx sent to pharmacy. Patient to call with update after completing. Additional Follow-up by: Jesse Fall RN,  April 06, 2010 8:46 AM  New/Updated Medications: CIPROFLOXACIN HCL 250 MG TABS (CIPROFLOXACIN HCL) Take one tablet by mouth two times a day Prescriptions: CIPROFLOXACIN HCL 250 MG TABS (CIPROFLOXACIN HCL) Take one tablet by mouth two times a day  #14 x 0   Entered by:   Jesse Fall RN   Authorized by:   Hart Carwin MD   Signed by:   Jesse Fall RN on 04/06/2010   Method used:   Electronically to        [facility]  [address] # [id number]* (retail)       [address]       [place], [place]  [address]       Ph: [phone] or [phone]       Fax: [phone]   RxID:   [id number]   Appended Document: Abd Pain Spoke with patient and she is still having some LLQ pain, she states it is a little better. Still taking the Cipro. Has had some nausea and took Zofran for this. Did not acutally vomit. Instructed patient to take the Cipro until finished and to keep her appointment with Dr. Juanda Chance on 04/17/10  Appended Document: Abd Pain reviewed and agree.

## 2010-04-17 ENCOUNTER — Ambulatory Visit (INDEPENDENT_AMBULATORY_CARE_PROVIDER_SITE_OTHER): Payer: Medicare Other | Admitting: Internal Medicine

## 2010-04-17 ENCOUNTER — Encounter: Payer: Self-pay | Admitting: Internal Medicine

## 2010-04-17 ENCOUNTER — Telehealth: Payer: Self-pay | Admitting: Internal Medicine

## 2010-04-17 DIAGNOSIS — K5730 Diverticulosis of large intestine without perforation or abscess without bleeding: Secondary | ICD-10-CM

## 2010-04-17 DIAGNOSIS — R1032 Left lower quadrant pain: Secondary | ICD-10-CM

## 2010-04-18 NOTE — Letter (Signed)
Summary: [facility]   Imported By: Lamona Curl CMA (AAMA) 04/11/2010 13:56:20  _____________________________________________________________________  External Attachment:    Type:   Image     Comment:   External Document

## 2010-04-18 NOTE — Letter (Signed)
Summary: CT ABD/[facility]  CT ABD/[facility]   Imported By: Lamona Curl CMA (AAMA) 04/11/2010 13:54:53  _____________________________________________________________________  External Attachment:    Type:   Image     Comment:   External Document

## 2010-04-26 ENCOUNTER — Ambulatory Visit (AMBULATORY_SURGERY_CENTER): Payer: Medicare Other | Admitting: *Deleted

## 2010-04-26 VITALS — Ht 66.5 in | Wt 138.0 lb

## 2010-04-26 DIAGNOSIS — K5730 Diverticulosis of large intestine without perforation or abscess without bleeding: Secondary | ICD-10-CM

## 2010-04-26 DIAGNOSIS — R1032 Left lower quadrant pain: Secondary | ICD-10-CM

## 2010-04-27 NOTE — Assessment & Plan Note (Signed)
Summary: ABD PAIN..[facility] PT//CX POL ADVISED.Marland Kitchenpapers in np file!    History of Present Illness Visit Type: Follow-up Visit Primary GI MD: Lina Sar MD Primary Provider:  Lucky Cowboy, MD Requesting Provider: n/a Chief Complaint: Pt c/o left side abd pain that radiates to her back, diarrhea and constipation  History of Present Illness:   This is a 77 year old white female with chronic left lower quadrant abdominal pain exacerbated in the last several weeks. She has not responded to Cipro and Flagyl. A CT scan of the abdomen and pelvis shows diverticulosis in the left lower quadrant without any evidence of inflammatory changes. Incidental gastric thickening was noted in the fundus of the stomach worrisome for an infiltrative process. She has been complaining of black stools.  She had an upper endoscopy in July 2010 with findings of  H. pylori negative gastritis. She has lost weight. She denies rectal bleeding. She was initially constipated and we put her on MiraLax. Now she is having diarrhea so she stopped her MiraLax and magnesium oxide because she is having accidents. Prior colonoscopies and endoscopies showed postprocedure complications of pelvic pain requiring several prior hospitalizations. We have attributed the complications to either pelvic adhesions or severe scarring of the left colon. She has a history of carcinoma in situ in a rectal polyp in 2004. Her last colonoscopy in December 2008 showed a tubulovillous adenoma.   GI Review of Systems    Reports abdominal pain.     Location of  Abdominal pain: left side.    Denies acid reflux, belching, bloating, chest pain, dysphagia with liquids, dysphagia with solids, heartburn, loss of appetite, nausea, vomiting, vomiting blood, weight loss, and  weight gain.      Reports constipation and  diarrhea.     Denies anal fissure, black tarry stools, change in bowel habit, diverticulosis, fecal incontinence, heme positive stool, hemorrhoids,  irritable bowel syndrome, jaundice, light color stool, liver problems, rectal bleeding, and  rectal pain.    Current Medications (verified): 1)  Levoxyl 75 Mcg Tabs (Levothyroxine Sodium) .... Take 1 Tablet By Mouth Once Daily 2)  Verapamil Hcl Cr 120 Mg  Cp24 (Verapamil Hcl) .... Take 1/2 Two Times A Day 3)  Vitamin D 2000 Unit  Tabs (Cholecalciferol) .... Two Times A Day 4)  Fortical 200 Unit/act Nasal Soln (Calcitonin (Salmon)) .Marland Kitchen.. 1 Spray in 1 Nostril Daily 5)  Centrum Silver  Tabs (Multiple Vitamins-Minerals) .Marland Kitchen.. 1 By Mouth Daily 6)  Cobal-1000 1000 Mcg/ml Soln (Cyanocobalamin) .Marland Kitchen.. 1 Injection Monthly 7)  Magnesium Oxide 400 Mg Tabs (Magnesium Oxide) .Marland Kitchen.. 1 By Mouth Three Times A Day 8)  Aricept 5 Mg Tabs (Donepezil Hcl) .... Take 1/2 Tablet At Bedtime 9)  Flax Seed Oil 1000 Mg Caps (Flaxseed (Linseed)) .... One Capsule By Mouth Two Times A Day 10)  Crestor 5 Mg Tabs (Rosuvastatin Calcium) .... Take 1 Tablet By Mouth Once Daily 11)  Benefiber  Powd (Wheat Dextrin) .... As Directed 12)  Citalopram Hydrobromide 40 Mg Tabs (Citalopram Hydrobromide) .... Take /12 Tablet By Mouth Once Daily 13)  Loratadine-Pseudoephedrine 10-240 Mg Xr24h-Tab (Loratadine-Pseudoephedrine) .... Once Daily 14)  Aspir-Low 81 Mg Tbec (Aspirin) .... One Tablet By Mouth Once Daily  Allergies (verified): 1)  ! Codeine 2)  ! * Mobic 3)  ! Demerol  Past History:  Past Medical History: Asthma Diabetes, Type 2 Hyperlipidemia Chronic Headaches Hypertension Pulmonary fibrosis/chronic granulomatous changes on CT Chest , minimal mediastinal LAN 3/09 Carcinoma in situ in colon polyp 2004 Hypothyroidism  Diverticulosis GERD  Past Surgical History: Reviewed history from 09/14/2008 and no changes required. Appendectomy thyroidectomy Tubal Ligation Right Breast Biopsie-Benign  Family History: Reviewed history from 09/17/2008 and no changes required. 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  The patient denies allergy/sinus, anemia, anxiety-new, arthritis/joint pain, blood in urine, breast changes/lumps, change in vision, confusion, cough, coughing up blood, depression-new, fainting, fatigue, fever, headaches-new, hearing problems, heart murmur, heart rhythm changes, itching, menstrual pain, muscle pains/cramps, night sweats, nosebleeds, pregnancy symptoms, shortness of breath, skin rash, sleeping problems, sore throat, swelling of feet/legs, swollen lymph glands, thirst - excessive , urination - excessive , urination changes/pain, urine leakage, vision changes, and voice change.         Pertinent positive and negative review of systems were noted in the above HPI. All other ROS was otherwise negative.   Vital Signs:  Patient profile:   77 year old female Height:      66 inches Weight:      138 pounds BMI:     22.35 BSA:     1.71 Pulse rate:   76 / minute Pulse rhythm:   regular BP sitting:   132 / 68  (left arm) Cuff size:   regular  Vitals Entered By: Ok Anis CMA (April 17, 2010 10:01 AM)  Physical Exam  General:  Well developed, well nourished, no acute distress. Eyes:  PERRLA, no icterus. Mouth:  No deformity or lesions, dentition normal. Abdomen:   soft abdomen with normal active bowel sounds. No distention. Marked tenderness along sigmoid colon without rebound or fullness. Epigastrium and right upper quadrant are unremarkable Rectal:   normal rectal sphincter tone with the very small amount of Hemoccult negative stool Extremities:  No clubbing, cyanosis, edema or deformities  noted. Skin:  Intact without significant lesions or rashes. Psych:  Alert and cooperative. Normal mood and affect.   Impression & Recommendations:  Problem # 1:  DIVERTICULOSIS OF COLON (ICD-562.10) Patient has severe symptomatic diverticulosis of the left colon but prior episodes of pelvic pain were attributed to procedures specifically flexible sigmoidoscopies and colonoscopies which were done for the purpose of surveillance for colon polyps. Although there are no inflammatory changes in the left colon, her left colon remains nonfunctioning. I would treat her conservatively because she would be a high risk for sigmoid resection. She will continue on Benefiber one tablespoon daily and continue her on antispasmodics given to her by Dr.McKeown. She will continue on a soft diet. She will complete her antibiotics. I will hold off on a flexible sigmoidoscopy due to prior experience with the postprocedure complications.  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 Her last examine was in December 2008. She is Hemoccult-negative.  Problem # 3:  NONSPECIFIC ABN FINDING RAD & OTH EXAM GI TRACT (ICD-793.4) Patient has thickening of the gastric fundus worrisome for infiltrative process. We will schedule her for an upper endoscopy.  Patient Instructions: 1)  You have been scheduled for an upper endoscopy to evaluate abnormality on the CT scan . You are  scheduled for Thursday 05/04/10 @ 3 pm. Someone who is 4 years of age or older should be with you for this entire procedure. 2)  You have been scheduled for a previsit with a nurse to go over endoscopy instructions. You are scheduled for Wednesday 04/26/10 @ 3:30 pm. You should come to [facility] 3rd floor for this. You should have someone who is 4 or older with you for this visit. 3)  Please discontinue magnesium oxide and MiraLax. 4)  Continue antispasmodic medication given by Dr. Oneta Rack. 5)  We have  sent a prescription for Tramadol 50 mg take 1 tablet every 6-8 hours as needed for abdominal pain to your pharmacy. 6)  Continue Benefiber 2 teaspoons daily. 7)  Copy sent to : Dr Oneta Rack 8)  The medication list was reviewed and reconciled.  All changed / newly prescribed medications were explained.  A complete medication list was provided to the patient / caregiver. Prescriptions: TRAMADOL HCL 50 MG TABS (TRAMADOL HCL) Take 1 tablet by mouth every 6-8 hours as needed for pain  #30 x 0   Entered by:   Lamona Curl CMA (AAMA)   Authorized by:   Hart Carwin MD   Signed by:   Lamona Curl CMA (AAMA) on 04/17/2010   Method used:   Electronically to        [facility] # [id number]* (retail)       [address]       [place], [place]  [address]       Ph: [phone] or [phone]       Fax: [phone]   RxID:   [id number]

## 2010-04-27 NOTE — Progress Notes (Signed)
Summary: Triage   Phone Note Call from Patient   Caller: Boyd Kerbs Call For: Dr. Juanda Chance Reason for Call: Talk to Nurse Summary of Call: [facility] nurse @ Dr. Michaelle Birks office is calling to speak with [facility] nurse about this patient, says the patient is very confused and she wants to make sure we get accurate information [phone] Initial call taken by: [place] Johnson,  April 17, 2010 9:26 AM  Follow-up for Phone Call        Dr. Juanda Chance spoke with Boyd Kerbs Follow-up by: Jesse Fall RN,  April 17, 2010 10:33 AM

## 2010-04-28 ENCOUNTER — Encounter: Payer: Self-pay | Admitting: Internal Medicine

## 2010-05-03 ENCOUNTER — Encounter: Payer: Self-pay | Admitting: Internal Medicine

## 2010-05-04 ENCOUNTER — Ambulatory Visit (AMBULATORY_SURGERY_CENTER): Payer: Medicare Other | Admitting: Internal Medicine

## 2010-05-04 ENCOUNTER — Encounter: Payer: Self-pay | Admitting: Internal Medicine

## 2010-05-04 DIAGNOSIS — R1033 Periumbilical pain: Secondary | ICD-10-CM

## 2010-05-04 DIAGNOSIS — R6889 Other general symptoms and signs: Secondary | ICD-10-CM

## 2010-05-04 DIAGNOSIS — R933 Abnormal findings on diagnostic imaging of other parts of digestive tract: Secondary | ICD-10-CM

## 2010-05-04 DIAGNOSIS — K2970 Gastritis, unspecified, without bleeding: Secondary | ICD-10-CM

## 2010-05-04 DIAGNOSIS — R109 Unspecified abdominal pain: Secondary | ICD-10-CM

## 2010-05-04 DIAGNOSIS — R9389 Abnormal findings on diagnostic imaging of other specified body structures: Secondary | ICD-10-CM

## 2010-05-04 DIAGNOSIS — K219 Gastro-esophageal reflux disease without esophagitis: Secondary | ICD-10-CM

## 2010-05-04 DIAGNOSIS — K2990 Gastroduodenitis, unspecified, without bleeding: Secondary | ICD-10-CM

## 2010-05-04 MED ORDER — SODIUM CHLORIDE 0.9 % IV SOLN: 500.0000 mL | INTRAVENOUS | Status: DC

## 2010-05-04 NOTE — Patient Instructions (Signed)
See blue and green sheets for additional d/c instructions 

## 2010-05-07 LAB — URINALYSIS, ROUTINE W REFLEX MICROSCOPIC
Glucose, UA: NEGATIVE mg/dL
Ketones, ur: NEGATIVE mg/dL
Protein, ur: NEGATIVE mg/dL

## 2010-05-07 LAB — GLUCOSE, CAPILLARY: Glucose-Capillary: 116 mg/dL — ABNORMAL HIGH (ref 70–99)

## 2010-05-07 LAB — CBC
Hemoglobin: 12.8 g/dL (ref 12.0–15.0)
Platelets: 199 10*3/uL (ref 150–400)
RDW: 12.4 % (ref 11.5–15.5)

## 2010-05-07 LAB — COMPREHENSIVE METABOLIC PANEL
ALT: 22 U/L (ref 0–35)
Albumin: 4.1 g/dL (ref 3.5–5.2)
Alkaline Phosphatase: 60 U/L (ref 39–117)
Glucose, Bld: 165 mg/dL — ABNORMAL HIGH (ref 70–99)
Potassium: 3.3 mEq/L — ABNORMAL LOW (ref 3.5–5.1)
Sodium: 140 mEq/L (ref 135–145)
Total Protein: 7.3 g/dL (ref 6.0–8.3)

## 2010-05-07 LAB — DIFFERENTIAL
Basophils Relative: 0 % (ref 0–1)
Eosinophils Absolute: 0 10*3/uL (ref 0.0–0.7)
Monocytes Absolute: 1 10*3/uL (ref 0.1–1.0)
Monocytes Relative: 10 % (ref 3–12)

## 2010-05-07 LAB — URINE MICROSCOPIC-ADD ON

## 2010-05-08 ENCOUNTER — Telehealth: Payer: Self-pay

## 2010-05-08 NOTE — Telephone Encounter (Signed)

## 2010-05-11 ENCOUNTER — Encounter: Payer: Self-pay | Admitting: Internal Medicine

## 2010-06-13 NOTE — Assessment & Plan Note (Signed)
[facility]                         GASTROENTEROLOGY OFFICE NOTE   NAME:Gilmore, [name] [name]                       MRN:          [id number]  DATE:07/29/2006                            DOB:          Feb 14, 1929    [name] [name] is a 77 year old white female with a history of carcinoma in  situ and a rectal polyp in August 2004.  She has since then had yearly  flexible sigmoidoscopy and colonoscopy with findings of  adenomatous__________ polyp in the rectum.  Her last resection date was  August 2006.  Unfortunately, the patient has had difficult colonoscopic  exams and had a lot of pain after the procedure with ileus and  peritoneal like signs necessitating hospitalization for observation.  It  is not clear why this occurred even with a pediatric scope on the last  occasion.  But, there is a very sharp turn of the colon at the pelvic  rim which is always difficult to negotiate.  For this reason, we have  skipped last year and avoided flexible sigmoidoscopy.  She was seen  recently for rectal bleeding and on anoscopic exam had a thromboses  hemorrhoid.  This was treated conservatively with Anusol suppositories  and topical steroids.  She comes today with the complete resolution of  these symptoms.   MEDICATIONS:  1. Alendronate 75 mg weekly.  2. Aspirin 81 mg p.o. daily.  3. Multivitamin.  4. Levoxyl 88 mcg, half tablet daily.  5. Loratadine 10 mg p.o. daily.  6. Metformin 500 mg, four tablets a day.  7. Pravastatin 40 mg p.o. daily.  8. Verapamil 120 mg q.a.m. and one h.s.   PHYSICAL EXAMINATION:  VITAL SIGNS:  Blood pressure 122/70, pulse 88,  weight __________ pounds.  The patient was not re-examined.   IMPRESSION:  1. Status post thrombosed hemorrhoid, now asymptomatic.  2. History of adenomatous polyp of the rectum, status post multiple      ablations in the past.  3. Prior colonoscopy complicated by abdominal pain necessitating a  hospitalization.   PLAN:  1. The patient will need a followup colonoscopy to make sure that the      polyp at the rectum has not re-grown.  She already had in the past      surgical consultation with the surgeon and elected not to pursue      excision of this polyp.  We will schedule her for followup      colonoscopy for January 2009, which will be 2.5 years from the last      colonoscopy.  2. She is to continue on a high fiber diet.  3. Call us if the bleeding recurs.     Hedwig Morton. Juanda Chance, MD  Electronically Signed    DMB/MedQ  DD: 07/29/2006  DT: 07/29/2006  Job #: [id number]   cc:   Lucky Cowboy, M.D.

## 2010-06-13 NOTE — Consult Note (Signed)
NAMEJACORIA, [name] [name]                ACCOUNT NO.:  [id number]   MEDICAL RECORD NO.:  [id number]          PATIENT TYPE:  INP   LOCATION:  3009                         FACILITY:  [facility]   PHYSICIAN:  Gaspar Garbe, M.D.DATE OF BIRTH:  29-Jan-1930   DATE OF CONSULTATION:  DATE OF DISCHARGE:                                 CONSULTATION   REFERRING PHYSICIAN:  Charlcie Cradle. Delford Field, MD.   CHIEF COMPLAINT:  Request per Dr. Shan Levans, pituitary changes on  MRI.   HISTORY OF PRESENT ILLNESS:  The patient is a 77 year old white female  with a history of pulmonary disease and currently being worked up for  pulmonary fibrosis and granulomatous disease, who also has had recent  low blood counts requiring transfusion, underwent bronchoscopy yesterday  with cultures and pathology performed as well as an EGD earlier today as  part of a workup for headaches.  The patient has been seen by neurology.  An MRI of her brain with contrast was ordered, which showed some  pituitary enlargement but did not show any impingement on her optic  chiasm.  I was asked to render an impression regarding the pituitary  enlargement and to perform any endocrine laboratory testing if  warranted.   The patient indicates that she has a history of migraines which tended  to get better after her menses but that she has had headaches on and off  over the past several months, for which she has mostly used Tylenol and  other over-the-counter type medicines.  It seems to be mostly behind her  eyes, but she has not noticed any visual disturbances or changes.  She  denies any new or any chronic-onset balance issues, has been otherwise  in a reasonably good state of health prior to her hospitalization.  The  patient indicates that she has been on Levoxyl for approximately 15  years following the removal of a portion of her thyroid gland.   ALLERGIES:  MOBIC and CODEINE.   MEDICATIONS:  1. Fosamax.  2. Advair twice  daily.  3. Levoxyl 88 mcg per day.  4. Amaryl 4 mg a.c.  5. Lantus insulin 5 units subcutaneous with sliding scale insulin.  6. Prednisone 20 mg per day.  7. Zocor 20 mg per day.   PAST MEDICAL HISTORY:  1. Chronic granulomatous disease with some fibrosis noted on CT scan      of her chest.  The patient is currently being worked up for this.  2. Iron-deficiency anemia.  3. Hypertension.  4. Chronic headaches.  5. Hyperlipidemia.  6. Diabetes mellitus type 2.  7. Hypothyroidism with a history of goiter.   PAST SURGICAL HISTORY:  The patient had thyroidectomy in 1991 and  appendectomy.   SOCIAL HISTORY:  The patient lives in [place].  She is a retired Scientist, water quality and does not have a smoking history.   FAMILY HISTORY:  Asthma, breast cancer, COPD, lung cancer and leukemias.   REVIEW OF SYSTEMS:  The patient denies any fevers or chills although her  daughter says that her temperature, although marked as normal, is  approximately 1 degree higher than what her usual is at home.  She notes  some right frontal headache at this time and some nasal discharge.  Is  not having any visual changes or hearing loss.  Denies any chest pain,  shortness of breath, and is able to speak in full sentences and breathe  off of oxygen with her mouth shut and is able to eat without difficulty.  She notes generalized weakness but no nausea, vomiting or diarrhea at  present.  Review of systems is otherwise negative for a 10-point scale.   PHYSICAL EXAM:  Temperature 98, pulse 71, respiratory rate 16,  saturating 98% on room air, blood pressure 135/66.  GENERAL:  No acute distress.  Very pleasant woman.  HEENT:  Normocephalic, atraumatic.  PERRLA.  EOMI.  ENT within normal  limits.  NECK:  Supple.  No lymphadenopathy, JVD or bruit.  HEART:  Regular rate and rhythm.  No murmur, rub or gallop are  appreciated.  LUNGS:  Clear to auscultation bilaterally.  ABDOMEN:  Soft, nontender, normoactive bowel  sounds.  No  hepatosplenomegaly.  EXTREMITIES:  No clubbing, cyanosis or edema.  MUSCULOSKELETAL:  No joint deformities noted.  NEUROLOGIC:  The patient is oriented to person, place and time.  She is  somewhat hard of hearing.  Cranial nerve exam II-XII is grossly intact  with 5/5 strength and normal sensation bilaterally.   IMAGING:  The patient has an MRI of her brain which shows a Prominent  pituitary.  Rough measurement is less than 10 mm in size.   LABS:  White count 7.8, hemoglobin 7.4, hematocrit 22.1, being  transfused, platelets 418.  BUN and creatinine of 13 and 0.6,  respectively.  Potassium normalized at 3.6, had previously been low.  TSH normal at 1.8324, Free T4 8.6, T3 uptake is slightly increased.  Cortisol level is 3.6.  ACE level is currently pending.   ASSESSMENT/PLAN:  1. Pituitary enlargement.  Differential includes pituitary adenoma      versus an incidentaloma.  I think it is most likely the latter.  I      am fairly unimpressed with the size of her pituitary on MRI and it      is certainly not the cause of her headaches as there is no evidence      of shift or impingement.  We will work it up from an endocrinologic      standpoint by adding a prolactin, ACTH and insulin growth factor I      to rule out prolactinoma and acromegaly.  She has not had any      production of milk from her breasts nor has she had any frontal      bossing or other changes consistent with that.  I think her      endocrine workup will most likely be negative.  If this is the      case, she warrants MRI repeat in 6 months and if this is negative,      it is most likely just simply an incidentaloma or related to the      type of imaging.  I do not feel it has any bearing on her overall      hospital course at this point.  We will continue to follow her      while she is in-house and discuss the results as they come back.  2. Anemia.  Workup currently pending per GI and pulmonary  critical  care.  3. Pulmonary fibrosis, granuloma.  Pathology currently pending.  It      seems as if a fungal workup is underway at this point, although an      exact cause is not known.  4. Hypothyroidism.  She has a normal TSH and free T4.  T3 uptake has      to be the least reliable of these sets of labs.  With the      normalized TSH and free T4, I would not change her dose of Levoxyl.  5. Chronic steroid dependence.  The patient is currently on 20 mg      prednisone, which would most likely cause her to have a decreased      cortisol.  This may make her workup a little bit more difficult,      but she does not have any sequelae of Cushing's clinically.  If      further workup is necessary, this will need to be done with her off      of steroid or a different kind of steroid with a 24-hour urine      cortisol performed.  However, as stated above, I believe this is      most likely an incidentaloma.   Thank you for allowing me to consult on patient [name] [name].      Gaspar Garbe, M.D.  Electronically Signed     RWT/MEDQ  D:  04/17/2007  T:  04/18/2007  Job:  [id number]   cc:   Charlcie Cradle. Delford Field, MD, FCCP

## 2010-06-13 NOTE — Discharge Summary (Signed)
[name] [name], [name] [name]                ACCOUNT NO.:  [id number]   MEDICAL RECORD NO.:  [id number]          PATIENT TYPE:  INP   LOCATION:  3009                         FACILITY:  [facility]   PHYSICIAN:  Charlcie Cradle. Delford Field, MD, FCCPDATE OF BIRTH:  Apr 30, 1929   DATE OF ADMISSION:  04/14/2007  DATE OF DISCHARGE:  04/18/2007                               DISCHARGE SUMMARY   DISCHARGE DIAGNOSES:  1. Pulmonary fibrosis with negative fibrotic bronchoscopy.  2. Headache.  3. Pituitary enlargement.  4. Rule out gastrointestinal blood loss.  5. Acute blood loss anemia.  6. Steroid exacerbation diabetes mellitus.  7. Relative low cortisol level.   HISTORY OF PRESENT ILLNESS:  [name] [name] is a 77 year old white  female patient of Dr. Shan Levans.  She is seen in the office and  admitted to the hospital for failure to thrive, fever, shortness of  breath, and episodes of epistaxis.  The patient was initially evaluated  on March 15, 2007, and noted to have a temperature of 104 degrees.  She had a productive cough of thick gray mucus.  This developed  increasing shortness of breath.  There was also a component of weight  loss; therefore, she was admitted for further evaluation and treatment.   LABORATORY DATA:  Pathology of fiberoptic bronchoscopy with biopsy shows  nonspecific chronic inflammatory changes.  MRI of the brain demonstrates  no acute infarct, no intracranial hemorrhage, prominent appearance of  pituitary gland as discussed, marked white matter type changes probably  related to small vessel disease, and no abnormal intracranial enhancing  lesions.  Chest x-ray demonstrates no acute disease of bilateral  pulmonary hyperexpansion noted.  CT of the head demonstrates significant  nonspecific white matter type changes probably related to small vessel  disease.  No hemorrhage or mass or lesion is noted.  Hemoglobin went  from a low of 7.4 to a high of 11.4, hematocrit 33.5, platelets  435, WBC  is 8.6.  Sodium 140, potassium 3.6, chloride 110, CO2 is 24, BUN is 13,  creatinine 0.61, and glucose is 73.  AST is 18, ALT is 19, alkaline  phosphatase 105, total bilirubin was 0.4, and albumin was 2.6.  Calcium  9.1, magnesium 1.5, phosphorus 4.1.  Fungal smears are pending.  AFB  cultures are pending.  Respiratory cultures showed nonpathogenic  oropharyngeal flora.  ACTH levels are pending.  Prolactin is pending.  Somatomedin C is pending.  ACTH stimulation test is pending.  Angiotensin-converting enzyme is pending.  T3 uptake is noted to be  38.3.  T4 is 8.6.  Cortisol level was 3.6.  TSH is 1.851.  Iron is 17.  Total iron binding capacity is 264%, sat is 6, UIBC is 247.  Ferritin  levels are 23.   HOSPITAL COURSE BY DISCHARGE DIAGNOSES:  Discharge diagnoses:  1. Pulmonary fibrosis.  She was admitted to the [facility].      She underwent fiberoptic bronchoscopy per Dr. Shan Levans on      April 16, 2007.  Multiple cultures were taken.  Cultures show      normal oropharyngeal flora.  AFB cultures are pending at this time.      Pathology showed no malignant cells.  2. Headache.  She underwent evaluation by Dr. Marcelino Freestone.  She      had extensive neurological workup.  MRI of the head and CT of the      head were as noted showed changes in the small vessel disease.  She      will continue to be evaluated with Dr. Orlin Hilding on an outpatient      basis with the followup MRI and CT in 6 months.  3. Acute blood loss anemia.  She had periods of epistaxis.  Her      hemoglobin was very low at 7.4.  She underwent a GI evaluation by      Dr. Arlyce Dice with an EGD on April 17, 2007, that did not demonstrate      any bleeding.  She will be continued to be followed in the GI      services on intermittent basis since she has a history of      diverticulosis.  4. Acute blood loss anemia.  She required 2 units of packed cells with      a change of her hemoglobin from 7.4 to  greater than 11.  5. Steroid exacerbation diabetes mellitus having been on steroids.      She has been discharged on hydrocortisone.  She will use Amaryl and      metformin as prescribed in her pharmaceutical regimen.  6. Relatively low cortisol level of 3.6.  She was placed on      hydrocortisone as an outpatient and will be followed up.  She was      evaluated while in the hospital by Endocrinology by Dr. Wylene Simmer,      and labs will be evaluated and continued followup on an outpatient      basis.   DISCHARGE MEDICATIONS:  1. Levoxyl 88 mcg daily.  2. Nu-Iron 150 mg b.i.d.  3. Glipizide 4 mg a day, hold if blood sugar is less than 190.  4. Advair 250/50 1 puff two times a day.  5. Metformin 500 mg daily.  6. Vitamin D 1 tab 2 times a day.  7. Pravastatin 40 mg daily.  8. Loratadine 10 mg 1 a day.  9. Lomotil p.r.n. for diarrhea.  10.Hydrocortisone 25 mg 2 tabs b.i.d. for 5 days and 1 tab b.i.d.      until she is evaluated by Dr. Delford Field and follow up with Dr.      Wylene Simmer.   DIET:  No concentrated sweets, heart-healthy diet.   ACTIVITY:  She is to increase her activity slowly.   FOLLOWUP:  She has got a followup appointment with Dr. Delford Field on April 24, 2007 at 3:50 p.m.  She will be evaluatedas an outpatient by Dr.  Orlin Hilding of [facility], and Dr. Wylene Simmer will continue to  follow her endocrine labs as an outpatient as deemed necessary by him.   DISPOSITION/CONDITION ON DISCHARGE:  Improved.      Devra Dopp, MSN, ACNP      Charlcie Cradle. Delford Field, MD, [facility]  Electronically Signed    SM/MEDQ  D:  04/18/2007  T:  04/19/2007  Job:  [id number]   cc:   Barbette Hair. Arlyce Dice, MD,FACG  Gaspar Garbe, M.D.  Catherine A. Orlin Hilding, M.D.

## 2010-06-13 NOTE — Assessment & Plan Note (Signed)
[facility]                         GASTROENTEROLOGY OFFICE NOTE   NAME:[name] [name], [name] [name]                       MRN:          [id number]  DATE:02/06/2007                            DOB:          12/06/1929    Ms. Quintanilla is a 77 year old white female with history of adenomatous  polyp of the rectum which has been removed multiple times through a  colonoscope.  Last colonoscopy in December 2008 again showed recurrence  of adenomatous polyp of the rectum.  Patient was in the past evaluated  by a surgeon for local excision of the adenoma, but elected to be  followed by colonoscopies and colonoscopy polypectomies.  She has had  complication following colonoscopy exams in terms of rectal bleeding and  rectal pain which occurs in association with severe diverticulosis of  the sigmoid colon.  After this most recent colonoscopy she was well for  a week, and then developed some rectal bleeding from post polypectomy  site as well as some lower abdominal pain, but overall she has does well  and has not required hospitalization this time.  Her only concern is  incomplete evacuation of the stool which is suggestive of rectocele.   I have not reexamined the patient today.  She seemed to be doing well.  There has been no recurrence of rectal bleeding.   PLAN:  We will plan to do flexible sigmoidoscopy only without full  colonoscopy exam in three years with plans for biopsies and  polypectomies.  By limiting the exam to flexible sigmoidoscopy we may  avoid the postop procedure abdominal pain.     Hedwig Morton. Juanda Chance, MD  Electronically Signed    DMB/MedQ  DD: 02/06/2007  DT: 02/06/2007  Job #: [id number]   cc:   Lucky Cowboy, M.D.

## 2010-06-13 NOTE — Consult Note (Signed)
NAMEDANYIEL, CRESPIN                ACCOUNT NO.:  [id number]   MEDICAL RECORD NO.:  [id number]          PATIENT TYPE:  INP   LOCATION:  3009                         FACILITY:  [facility]   PHYSICIAN:  Gustavus Messing. Orlin Hilding, M.D.DATE OF BIRTH:  1929/02/26   DATE OF CONSULTATION:  04/14/2007  DATE OF DISCHARGE:                                 CONSULTATION   REFERRING PHYSICIAN:  Charlcie Cradle. Delford Field, MD   CHIEF COMPLAINT:  Intermittent right-sided headache.   HISTORY OF PRESENT ILLNESS:  [name] [name] is a 77 year old right handed  white woman with history of about a month long illness which is marked  by fever, shortness of breath, hilar adenopathy, epistaxis with  intermittent right-sided headache.  According to Dr. Delford Field the patient  told him, in the office today, that she has had the worst headache of  her life although she now denies that, and in fact told a resident that  she did not have any headache at all.  She then amended that, and said  she does have a slight headache on the right which she would give a 4 on  a 0-10 scale without any analgesics.  She denies any confusion, vision  changes, or neck stiffness.  She did feel that the headache was much  worse when the packing was in her nose, but did have a headache prior to  that being started.  She has been on numerous antibiotics.  She was  admitted from the office because of the failure to thrive intermittent  fevers, shortness of breath, and abnormal CT of the chest.   REVIEW OF SYSTEMS:  Positive for fever, chills, sweats, appetite loss,  fatigue, malaise and weight loss; some blurring of her vision without  double vision or light sensitivity; the nose bleeds which have already  been described.  No sore throat or hoarseness.  No difficulty  swallowing.  Some lightheadedness without chest pain.  Shortness of  breath and bringing up sputum, some wheezing.  She has had a general  loss of appetite, nausea, vomiting, diarrhea.  No  musculoskeletal  complaints except for some chronic problems of the left lower extremity  where she has had chronic balance issues, numbness, and weakness of the  left leg with what sounds like a foot drop possibly peroneal neuropathy  or lumbar radiculopathy.   PAST MEDICAL HISTORY SIGNIFICANT FOR:  1. Appendectomy.  2. Thyroidectomy.  3. Hypertension.  4. Chronic headaches.  The patient denies that they are migrainous.  5. Hyperlipidemia.  6. Type 2 diabetes.  7. Asthma.   MEDICATIONS:  1. Advair discus, although the patient is not currently using that.  2. She has been on Fosamax once a week, not taking that currently.  3. Levoxyl 80 mcg daily.  4. Loratadine 10 mg daily.  5. Metformin 500 mg a day.  6. Pravastatin 40 mg daily.  7. Verapamil 120 mg daily, although it is unclear if she is currently      taking it.  8. Vitamin D 2 a day.  9. Prednisone once a day 10 mg.  10.Fluconazole 100 mg  daily that may have been discontinued.  11.She was taking some Vicodin 5/500, not taking it currently.  12.Alprazolam 0.5 mg p.r.n., not using that.  13.Tylenol as needed.  14.Glimepiride 4 mg 1/4 tablet if her sugar is over 190.   ALLERGIES:  She lists as allergies MOBIC and CODEINE.   SOCIAL HISTORY:  She has never smoked.  She is a retired Education officer, environmental.   FAMILY HISTORY:  Positive for asthma, breast cancer, COPD, lung cancer,  and leukemia.   OBJECTIVE:  VITAL SIGNS:  On exam temperature was 96.9, when seen at Dr.  Lynelle Doctor office. The hospital  vitals are not in yet, blood pressure  112/60, pulse 111.  HEENT AND NECK:  Her head is normocephalic, atraumatic.  Her neck is  very supple.  There is no rigidity and no pain with movement.  No  stiffness.  GENERAL:  She is awake, alert and appropriate with normal language and  cognition.  NEUROLOGIC EXAM:  Pupils were equal and reactive.  Visual fields are  full.  Extraocular movements are intact.  Facial sensation is normal.  Facial  motor activity is normal.  Hearing is intact.  Palate is  symmetric.  Tongue is midline.  On motor exam there is no drift.  She  has normal bulk, tone and strength throughout with 5/5 strength in all  four extremities with the exception of some mild foot drop and weakness  of extensor hallucis longus on the left, which is chronic.  I am not  sure if this is a peroneal neuropathy or a lumbar radiculopathy.  There  is no adventitious movements.  I did not to assess her gait.  Reflexes  are 1 to 2+ and symmetric with the exception of absent ankle.  She had  downgoing toes bilaterally.  COORDINATION:  Finger-to-nose and heel-to-  shin are normal.  SENSORY:  Shows decreased distal left lower extremity.  Also sensation, again, possibly peroneal.  CT of the brain shows nothing  acute, does show some chronic small vessel disease.   LABS:  Pending.   IMPRESSION:  Headache in the setting of fever, epistaxis, weight loss,  generalized weakness, abnormal chest CT; but exam is nonfocal without  suggestion of meningitis or subarachnoid hemorrhage.  She does not  appear toxic.  She has a chronic foot drop.  I suppose a partially  treated meningitis is possible, she has been on several antibiotics;  but, again, she does not have a toxic appearance.  Need to consider  neurosarcoid.   RECOMMENDATIONS:  Check an MRI scan of the brain with and without  contrast.  We will need to consider an LP in the future.      Catherine A. Orlin Hilding, M.D.  Electronically Signed     CAW/MEDQ  D:  04/14/2007  T:  04/15/2007  Job:  [id number]

## 2010-06-13 NOTE — H&P (Signed)
NAMESHANNIA, JACUINDE                ACCOUNT NO.:  [id number]   MEDICAL RECORD NO.:  [id number]          PATIENT TYPE:  INP   LOCATION:  1506                         FACILITY:  [facility]   PHYSICIAN:  Iva Boop, MD,FACGDATE OF BIRTH:  05-Jul-1929   DATE OF ADMISSION:  08/11/2008  DATE OF DISCHARGE:                              HISTORY & PHYSICAL   ADMISSION DIAGNOSES:  Intractable nausea and vomiting, abdominal pain.   HISTORY:  [name] [name] is a pleasant 77 year old white female known to Dr.  Lina Sar, primary patient of Dr. Oneta Rack who has history of IBS,  diverticular disease and has had a recurrent rectal adenomatous polyps.  Her last colonoscopy was done in December of 2008 with local excision at  that time.  She did require admission after that for a post polypectomy  bleed.  She has at this time been having trouble over the past 6 weeks  with diarrhea which had been attributed to IBS.  She was started on  Imodium and Levbid both b.i.d., which did seem to slow her stooling  down.  Around that same time she also started having nausea.  She has  been using Compazine on a p.r.n. basis.  She was seen by Dr. Oneta Rack for  these same symptoms, has undergone an abdominal ultrasound which was  negative and labs which were unremarkable with the exception of an  Helicobacter pylori antibody IgG which was positive.  She was  subsequently started on a regimen of proton pump inhibitor, Biaxin and  amoxicillin.  Since that time she has had increased nausea and now  despite stopping this Helicobacter pylori regimen she continues to have  increased nausea symptoms over the past couple of days and has gotten to  the point of intractable vomiting since last evening.  She was seen and  evaluated in the emergency room.  Labs unremarkable per the ER and a CT  of the abdomen and pelvis has been done which is remarkable only for  increased stool in the rectosigmoid.   Her family does not feel that she  should be discharged to home as she  has not been able to keep any p.o.'s down and is quite weak.  She denies  any fever or chills, has also had some laryngitis symptoms over the past  week and was started on prednisone as a tapered Dosepak yesterday.   PAST MEDICAL HISTORY:  Pertinent for hypothyroidism, hypertension, non-  insulin-dependent diabetes mellitus with no current medications,  degenerative disk disease, asthma, GERD.  She is status post laparoscopy  and had a for tubal pregnancy and also appendectomy remotely.  She has  had a right thyroid lobectomy, several colonoscopies due to a rectal  adenomatous polyp and significant diverticular disease.   CURRENT MEDICATIONS:  1. Verapamil 120 one half b.i.d.  2. Levoxyl 88 mcg half daily.  3. Magnesium oxide 400 t.i.d.  4. Vitamin D 2000 units 4 tablets daily.  5. Loratadine 10 daily.  6. Prilosec 20 b.i.d.  7. Biaxin and amoxicillin more recently stopped.  8. Levbid 1/2 tablet q.a.m.   ALLERGIES:  No  known drug allergies.   FAMILY HISTORY:  Father deceased at 46 with an MI.  Mother deceased at  41 with breast cancer.  One sister deceased with breast cancer and one  brother deceased with lung cancer.   SOCIAL HISTORY:  The patient is widowed.  She lives alone.  She is  retired.  She has very supportive family.  No tobacco and no EtOH.   REVIEW OF SYSTEMS:  GENERAL:  Positive for weakness and nausea.  MUSCULOSKELETAL:  She denies any current complaints.  HEENT:  Positive  for hoarseness over the past week.  Apparently she has had laryngitis in  the past.  She denies any sore throat.  CARDIOVASCULAR:  Denies any  chest pain or anginal symptoms.  PULMONARY:  Denies cough, shortness of  breath or sputum production.  GENITOURINARY:  Denies any dysuria,  urgency or frequency.  GI:  As above.  NEURO/PSYCH:  Negative.  All  other review of systems negative.   LABS:  WBC 9.6, hemoglobin 12.8, hematocrit of 37.8, MCV of 91,   potassium 3.3, BUN 17, creatinine 0.74.  LFTs normal.  Lipase 22 and UA  negative.   PHYSICAL EXAM:  VITAL SIGNS:  Blood pressure 154/75, pulse in the 60s,  temperature 97.4, sat 97 on room air.  GENERAL:  A well-developed, elderly white female, pale, in no acute  distress.  She is alert and oriented x3.  HEENT:  Nontraumatic, normocephalic.  EOMI, PERRLA.  Sclerae anicteric.  NECK:  Neck is supple and without nodes.  There is no JVD.  CARDIOVASCULAR:  Regular rate and rhythm with S1-S2.  No murmur, rub or  gallop.  PULMONARY:  Clear to A and P.  ABDOMEN:  Abdomen is soft.  Bowel sounds are active.  She is mildly  tender in the left lower quadrant.  There is no guarding.  No rebound.  No mass or hepatosplenomegaly.  RECTAL:  Loss of stool in the rectal vault.  No real impaction.  Stool  is heme negative.  EXTREMITIES:  No clubbing, cyanosis or edema.  NEUROLOGICAL:  The patient is alert and oriented x3 and exam is grossly  nonfocal.   IMPRESSION:  35. A 77 year old white female with 6 week history of diarrhea and      nausea, treated initially as irritable bowel syndrome with      improvement in diarrhea symptoms but has remained nauseated and      this has now progressed to the point of intractable vomiting over      the past 24 hours.  Questions symptoms in part exacerbated by      regimen for Helicobacter pylori, rule out underlying gastritis,      peptic ulcer disease, rule out gastroparesis, rule out viral      syndrome.  2. Constipation per CT.  Probably exacerbated by antidiarrheals and      antispasmodics.  3. History of diverticulosis.  4. History of rectal adenoma, recurrent.  5. Laryngitis.  6. Hypertension.  7. Hypothyroidism.  8. Status post laparoscopy and appendectomy.   PLAN:  The patient is admitted to the service of Dr. Stan [name] for IV  fluid hydration, bowel rest, around-the-clock proton pump inhibitor,  antiemetic and Reglan.  Will hold the Helicobacter  pylori regimen, hold  Levbid.  She will be given Fleet's enema this evening to clear her lower  bowel.  Continue on her prednisone taper for the laryngitis and schedule  for EGD on July 15 to clarify her  diagnosis.      Amy Esterwood, PA-C      Iva Boop, MD,FACG  Electronically Signed    AE/MEDQ  D:  08/12/2008  T:  08/12/2008  Job:  [id number]   cc:   Lucky Cowboy, M.D.  Fax: [phone]

## 2010-06-13 NOTE — Op Note (Signed)
NAMEMARCEY, PERSAD                ACCOUNT NO.:  [id number]   MEDICAL RECORD NO.:  [id number]          PATIENT TYPE:  INP   LOCATION:  3009                         FACILITY:  [facility]   PHYSICIAN:  Charlcie Cradle. Delford Field, MD, FCCPDATE OF BIRTH:  [birth year]08[birth year]1931   DATE OF PROCEDURE:  04/16/2007  DATE OF DISCHARGE:                               OPERATIVE REPORT   PROCEDURE PERFORMED:   OPERATOR:  Charlcie Cradle. Delford Field, MD, FCCP   ANESTHESIA:  1% Xylocaine local.   PREOP MEDICATION:  Versed 2 mg, fentanyl 20 mcg IV push.   INDICATIONS FOR PROCEDURE:  Bilateral interstitial infiltrates and hilar  mediastinal adenopathy.  Evaluate for cause.   DESCRIPTION OF PROCEDURE:  The Pentax video bronchoscope was introduced  via the oropharynx, the upper airways were visualized and unremarkable.  The entire tracheobronchial tree was visualized and revealed no  endobronchial lesions and no tracheobronchitis was seen.  Bronchial  washings were obtained, right upper lobe and then transbronchial  biopsies x5 were obtained from the right lower lobe lateral segment.   COMPLICATIONS:  None.   IMPRESSION:  Bilateral interstitial infiltrates, evaluate for cause.   RECOMMENDATIONS:  Follow-up pathology, microbiology.      Charlcie Cradle Delford Field, MD, [facility]  Electronically Signed     PEW/MEDQ  D:  04/16/2007  T:  04/16/2007  Job:  [id number]   cc:   Lucky Cowboy, M.D.  Ines Bloomer, M.D.

## 2010-06-13 NOTE — Assessment & Plan Note (Signed)
[facility]                         GASTROENTEROLOGY OFFICE NOTE   NAME:[name], [name] QUARRY                       MRN:          [id number]  DATE:01/10/2007                            DOB:          [birth year]    This is an office add-on note.   PROBLEM:  Rectal bleeding and lower abdominal pain post colonoscopy.   HISTORY:  [name] [name] is a 77 year old white female who has a history of a  carcinoma in situ and a rectal polyp found in 2004.  She has had yearly  exams since then with findings of an adenomatous polyp in the rectum.  She had this resected in 2006.  Unfortunately, she had a lot of  difficulty after the procedure with an ileus and pain.  She required  hospitalization.  She was treated for possible mild diverticulitis.  CT  scan findings did not confirm this.  At this time she has undergone  followup colonoscopy with Dr. Juanda Chance on January 03, 2007.  She was found  to have severe diverticular disease of the sigmoid colon.  There was  some difficulty with passage of the scope and she had a rectal polyp  approximately 9-mm which was snared and cauterized.  The path on the  polyp returned showing a tubular adenoma and tubulovillous adenoma.  No  high grade dysplasia.  The patient says that she did well initially post  procedure, but then yesterday passed a small amount of bright red blood  per rectum with a bowel movement.  She says she has been having normal  bowel movements over the past week.  She has also developed some lower  abdominal discomfort into her back and bilaterally in the lower  quadrants over the past 5 days.  She has been a bit nauseated but has  not had any vomiting.  Her appetite has been fair.  She has had some  cramping with bowel movements.  She has been using Levsin p.r.n. which  does help.  She has not had any documented fevers.  She did have some  chills about 4 days ago.   CURRENT MEDICATIONS:  1. Alendronate 75 every week.  2. Aspirin 81 mg daily.  3. Multivitamin daily.  4. Glimepiride  4 mg p.r.n.  5. Levoxyl 88, one half tablet daily.  6. Loratadine 10 daily.  7. Metformin 500, four daily.  8. Pravastatin 40 mg daily.  9. Verapamil 120 b.i.d.  10.Fortical spray 200 units.  11.Vitamin D 2,000 b.i.d.   ALLERGIES:  1. CODEINE with nausea and vomiting.  2. MOBIC.   PHYSICAL EXAMINATION:  GENERAL:  Well developed, elderly, white female  in no acute distress, pleasant.  VITAL SIGNS:  Blood pressure 144/64, pulse is 71, weight is 146.  CARDIOVASCULAR:  Regular rate and rhythm with S1 and S2.  PULMONARY:  Clear to A&P.  ABDOMEN:  Soft.  She is mildly tender bilaterally in the lower  quadrants.  There is no guarding or rebound.  Bowel sounds are active.  No mass or hepatosplenomegaly.  RECTAL:  Reveals brown stool mixed with bright red blood.   IMPRESSION:  1. Low grade post polypectomy bleeding with recent resection of rectal      polyp, approximately 1 week ago.  2. Rule out mild diverticulitis exacerbated by colonoscopy.  The      patient had pain post colonoscopy with her last procedure as well.      It may due just to the extent of her diverticular disease.      Nevertheless, we will treat with a course of antibiotics      empirically.   PLAN:  1. CBC today.  2. Advised observation for the time being.  The patient and daughter-      in-law were advised that should her bleeding increase over the next      few days, to call for further advice and/or go to the emergency      room as at that point admission may be necessary.  3. Avoid aspirin and NSAIDs.  She did inadvertently take 2 Advil the      other day.  She is asked to hold her baby aspirin as well for the      next 2 weeks.  4. Tylenol extra strength 2 every four to six hours as needed for      discomfort.  5. Darvocet-N 100 one every four to six hours as needed for pain.  6. Cipro 500 b.i.d. x10 days.  7. Flagyl 500 b.i.d. x10 days.   8. Return office visit with Dr. Juanda Chance in 2 weeks or sooner p.r.n.      Mike Gip, PA-C  Electronically Signed      Iva Boop, MD,FACG  Electronically Signed   AE/MedQ  DD: 01/10/2007  DT: 01/12/2007  Job #: [id number]   cc:   Hedwig Morton. Juanda Chance, MD

## 2010-06-13 NOTE — Assessment & Plan Note (Signed)
[facility]                         GASTROENTEROLOGY OFFICE NOTE   NAME:[name], [name] DROEGE                       MRN:          [id number]  DATE:06/20/2006                            DOB:          March 11, 1929    HISTORY OF PRESENT ILLNESS:  [name] [name] is a 77 year old white female who  had experienced sudden onset of a large amount of rectal bleeding last  night. We have known her because she has a history of in situ carcinoma  and rectal polyp, which was removed in August 2004. She has since then  had a full colonoscopy in May 2006 and flexible sigmoidoscopy in May  2005. She is supposed to have another flexible sigmoidoscopy this year.  The patient has been experiencing rectal fullness, some incontinence,  although no evidence of diarrhea. She has not seen any blood in the  stool until last night. There has been no bowel movement since the  bleeding occurred last night. There was no abdominal pain. This morning,  while voiding, the patient saw some bright red blood on the toilet  tissue as she wiped.   MEDICATIONS:  Multiple vitamins, aspirin 81 mg p.o. daily, Verapamil SR  240 mg b.i.d., Zocor 40 mg p.o. daily, Metformin ER 500 mg 4 daily,  Amaryl 4 mg every other day, prednisone  20 mg daily, Pravastatin 4 mg  p.o. daily, and Loratadine 10 mg p.o. daily.   PHYSICAL EXAMINATION:  VITAL SIGNS:  Blood pressure 122/72, pulse 84,  weight 155 pounds.  NEUROLOGIC:  Alert and oriented. No acute distress.  LUNGS:  Clear to auscultation.  CARDIOVASCULAR:  Normal S1 and S2.  ABDOMEN:  Soft, nontender with normal active bowel sounds.  RECTAL:  On anoscopic examination, there was a protruding second grade  hemorrhoid, which was bloody with fresh blood on top of it and  thrombosis. It did not appear to be tender and there was actually no  active bleeding at the time. There were no other hemorrhoids noted. The  rectal ampulla was normal. There was a small amount of  green Hemoccult  negative stool above the rectal ampulla.   IMPRESSION:  1. Thrombosed bleeding hemorrhoid, status post episode of rectal      bleeding.  2. Irritable bowel syndrome/rectocele.  3. Decreased rectal tone.   PLAN:  1. Anusol HC suppository b.i.d. x3 days then q.h.s.  2. Analpram cream 2.5%, to use 3 times a day and p.r.n.  3. Office visit in 6 weeks to re-examine. If bleeding continues,      obtain CBC. We could not get CBC today because the lab was closed      at 5:30, now being 6:00.     [name] [name]. Juanda Chance, MD  Electronically Signed    DMB/MedQ  DD: 06/20/2006  DT: 06/20/2006  Job #: [id number]   cc:   Lucky Cowboy, M.D.

## 2010-06-16 NOTE — H&P (Signed)
[facility]  Patient:    [name] [name], [name] [name]                       MRN: [id number] Adm. Date:  [id number] Disc. Date: [id number] Attending:  Nadean Corwin                         History and Physical  HISTORY OF PRESENT ILLNESS:  This is a pleasant 77 year old white female patient of Dr. Minerva Ends who presents in acute respiratory distress. She has a history of diabetes, last onset of asthma, and comes in for respiratory distress thought to be secondary to an asthma flare and subacute bronchitis.  She had been doing fairly well, until she was seen several days prior to admission as an outpatient at Dr. Michaelle Birks office for an upper respiratory infection.  At that time, he treated her with a Z-Pak and a steroid pulse taper.  She did not improve but remained stable, until the day of admission.  Approximately two hours prior to admission, she became acutely worse in her breathing, was labored, anxious with respiratory distress.  Her oxygenation was reduced and she was quite dyspneic with talking.  She was using her accessory muscles.  She was not responding to albuterol nebulizer treatments as well as we would like, so we brought her in for continued monitoring and treatment.  PAST MEDICAL HISTORY: 1. Insulin-dependent diabetes. 2. Hypertension. 3. Asthma, late onset. 4. Seasonal allergies. 5. Hypothyroidism. 6. Postmenopausal.  MEDICATIONS: 1. Claritin. 2. Neurontin. 3. Levsin. 4. Zocor. 5. Synthroid. 6. Verapamil. 7. Premarin. 8. Lasix .  ALLERGIES:  No known drug allergies.  SOCIAL HISTORY:  Nonsmoker, nondrinker.  Retired, married, with very supportive family.  FAMILY HISTORY:  Unremarkable.  PHYSICAL EXAMINATION:  VITAL SIGNS:  Afebrile.  Temperature 97.7, blood pressure 156/81, pulse 94 and regular, respirations 40 and labored, pulse oximetry on room air 88-92%.  HEENT:  [place]/AT, EOMI, PERRLA.  Clear posterior pharynx.   Normal TMs and nares.  NECK:  Supple without adenopathy, JVD, thyromegaly, or bruits.  CHEST:  Coarse breath sounds, poor air movement with bronchial wheezes.  No rhonchi or rales.  Decreased air movement bilateral bases, use of accessory muscles, and difficulty talking.  CARDIOVASCULAR:  Regular rate without murmurs, gallops, or rubs.  Slight tachycardia.  ABDOMEN:  Soft, nontender, nondistended.  Bowel sounds present without hepatosplenomegaly or masses.  EXTREMITIES:  Without cyanosis, clubbing, or edema.  LABORATORY DATA:  Labs pending on admission.  Chest x-ray:  No acute disease or COPD or congestive heart failure.  ASSESSMENT AND PLAN:  This is a pleasant 77 year old white female patient of Dr. Michaelle Birks who I will admit for acute respiratory distress.  1. Acute respiratory distress.  We will treat the patient with IV Solu-Medrol    60 mg q.8h., continuous O2 at 2 L per minute, p.r.n. nebulizer treatments    with albuterol and Atrovent.  We will also use magnesium sulfate bolus.  I    will continue antibiotics of Levaquin 500 mg IV q.d.  I stressed the    importance of the patient trying to reduce her talking, since she gets    significantly short of breath and reduced oxygenation just with little    effort and to stay in the bed.  Tussionex as needed. 2. Asthma, as above. 3. Acute bronchitis, as above. 4. Hypertension.  Continue her Verapamil. 5. Hypothyroidism.  Continue the Synthroid. 6.  Diabetes, poorly controlled, likely due to the prednisone.  Question with    her control as an outpatient.  We will put her on the hospital sliding    scale subcutaneous insulin orders for an insulin-sensitive diabetic, since    unknown what her baseline is, since she is not usually on insulin.    Continue on an ADA diet and follow her Accu-Cheks. DD:  09/07/00 TD:  09/08/00 Job: [id number]

## 2010-06-16 NOTE — Op Note (Signed)
NAME:  [name] [name], [name] [name]                          ACCOUNT NO.:  [id number]   MEDICAL RECORD NO.:  [id number]                   PATIENT TYPE:  AMB   LOCATION:  ENDO                                 FACILITY:  [facility]   PHYSICIAN:  Lina Sar, M.D. [facility]               DATE OF BIRTH:  January 12, 1930   DATE OF PROCEDURE:  DATE OF DISCHARGE:                                 OPERATIVE REPORT   PROCEDURE:  Flexible sigmoidoscopy and polyp ablation.   INDICATIONS FOR PROCEDURE:  This 77 year old black female has a diagnosis of  adenomatous polyp of the rectum with a high grade dysplasia focally. This  was a sessile polyp removed on colonoscopy on September 25, 2002. She was  subsequently hospitalized with abdominal pain and recovered fully. She was  referred to Dr. Daphine Deutscher for consideration of local resection of the rectal  polyp because of concern for extension of the high grade dysplasia into the  deeper tissues of the rectum. The patient has done quite well for the past  several weeks except for constipation. She now comes for flexible  sigmoidoscopy and a review of the rectal anatomy to see whether any polyps  have been left behind and to obtain repeat biopsies from the area of focal  dysplasia.   ENDOSCOPE:  Olympus single channel videoscope.   SEDATION:  Versed 5 mg IV, fentanyl 100 mcg IV.   FINDINGS:  The Olympus single channel videoscope passed under direct vision  through the rectum to the sigmoid colon. The patient was monitored by pulse  oximeter. Her oxygen saturations were normal. The patient's prep consisted  of Fleet's enema's x2. The perianal area was normal. The rectal tone was  normal to slightly decreased. In the anal canal was a longitudinal polypoid  tissue which extended from the rectal ampulla. It appeared foamy and soft  and was consistent with papillomatous polyp.   The rectal ampulla was essentially free of any large polyps. On closer  inspection, a small foci of polypoid  tissue were found scattered  multifocally around the area of the post polypectomy site. These measured  about 3, 4 to 5 mm in diameter and they were soft appearing polypoid  nodules. Most of these were biopsied and attempts to snare these polyps was  unsuccessful because they were very soft and flattened so they were ablated  with ERBE argon laser coagulator. The area of the post polypectomy site  appeared completely flat with no residual polyp in it, also multiple  biopsies from this area were obtained to rule out any residual low grade  dysplasia. At least 4 or 5 small polyps were removed from the rectal ampulla  which were in a satellite distribution around the old polypectomy site.  These were sent to pathology. The ERBE coagulator was used in multiple sites  to ablate the polypoid tissue. The sigmoid and descending colon showed  little spasm and  diverticulosis particularly in the sigmoid colon but the  rest of the colon was not examined. Retroflexion of the endoscope in the  rectum confirmed the presence of the polypoid tissue in the anal canal but  there were no significant hemorrhoids. After extensive ablation of the  remaining polyps, the procedure was terminated. The patient tolerated the  procedure well.   IMPRESSION:  Near complete ablation of the residual polyp in the rectal  ampulla with only a small foci of the residual polyps left. These were also  ablated with the ERBE laser coagulator. Extension of the polyp in the anal  canal status post biopsies.   PLAN:  I have discussed the findings with Dr. Daphine Deutscher. In general, there is  not much polyp left in the rectum and the residual polyps could be managed  most likely conservatively except in the case in which the biopsies from  today  show high grade dysplasia, in that case will discuss further management with  Dr. Daphine Deutscher possibility of deep resection of the lesions in the rectal  ampulla. If no high grade dysplasia is found  today, we will see patient most  likely in six months for rebiopsying and inspection of the rectal ampulla.                                               Lina Sar, M.D. [facility]    DB/MEDQ  D:  11/25/2002  T:  11/25/2002  Job:  [id number]   cc:   [place] Daphine Deutscher, M.D.  [address], [address]  [place]  [place] [address]  Fax: [phone]

## 2010-06-16 NOTE — Discharge Summary (Signed)
[name] [name], [name] [name]                ACCOUNT NO.:  [id number]   MEDICAL RECORD NO.:  [id number]          PATIENT TYPE:  INP   LOCATION:  1506                         FACILITY:  [facility]   PHYSICIAN:  Iva Boop, MD,FACGDATE OF BIRTH:  1929/05/21   DATE OF ADMISSION:  08/11/2008  DATE OF DISCHARGE:  08/13/2008                               DISCHARGE SUMMARY   DISPOSITION:  Home in stable condition.   DISCHARGE MEDICATIONS:  1. Verapamil 120 mg one half tablet twice daily.  2. Magnesium oxide 400 mg one tablet three times a day.  3. Synthroid 88 mcg once a day.  4. Omeprazole 20 mg b.i.d.  5. Hyoscyamine 0.375 mg twice a day.  6. Prednisone as directed by Dr. Oneta Rack.   CONSULTATIONS:  None requested.   PROCEDURES:  EGD with biopsy.   ADMITTING DIAGNOSES:  1. Nausea and vomiting.  2. Constipation.  3. History of diverticulosis.  4. Laryngitis.  5. History of rectal adenoma.  6. Hypertension.  7. Hypothyroidism.  8. Diabetes.   HOSPITAL COURSE:  [name] [name] was admitted through the emergency  department where she presented on August 11, 2008 with intractable nausea  and vomiting.  CBC and comprehensive metabolic profile were normal.  Urinalysis was negative.  The patient was hemodynamically stable.  CT of  the abdomen and pelvis showed extensive stool in the rectosigmoid, mild  free fluid in the pelvis.  Stable right lower lobe pleural-based  nodules.  Few hypodensities in the kidneys.  No acute abnormalities were  seen in the abdomen or pelvis.  [name] [name] was admitted to Dr. Stan Head Service.  He was at the hospital that week.  She was given IV  fluids, IV Reglan, proton pump inhibitor.  Her home verapamil, Levoxyl,  and prednisone were continued.  For constipation, the patient was given  two Fleet Enemas.  The second day, the patient was still nauseated, but  had no further vomiting.  It was determined that her nausea was mainly  surrounding meals and started within a  few bites of a meal.  The patient  was sent for an EGD, which showed mild gastritis.  Biopsy showed chronic  active gastritis, no evidence of H. pylori, intestinal metaplasia, or  dysplasia.  The patient finally had a bowel movement.  The patient was  discharged on August 13, 2008.  She was encouraged to start nutritional  supplements 3 to 4 times a day at home.  No further inpatient testing  was needed.  The patient was given a followup appointment with her  primary gastroenterologist, Dr. Juanda Chance on September 13, 2008 at 11 a.m.   DISCHARGE DIAGNOSES:  1. Nausea and vomiting.  Vomiting resolved.  Nausea better, but not      totally resolved.  EGD unremarkable except for biopsies revealing      chronic active gastritis, no Helicobacter pylori.  2. Constipation by CT scan.  Resolved with enemas.  3. Chronic active gastritis.  The patient will continue home PPI twice      daily.  Helicobacter pylori negative.  Note, the patient did  have a      positive Helicobacter pylori antibody IgG diagnosed and partially      treated with antibiotics (the patient developed nausea while on the      regimen and it is possible that the course of antibiotics was not      completed).   DISCHARGE DIAGNOSES:  1. History of diverticulosis  2. Laryngitis, resolving.  On prednisone under direction of Dr.      Oneta Rack.  3. History of rectal adenoma.  4. Hypertension.  5. Hypothyroidism, on replacement hormone.  6. Diabetes, Amaryl had been on hold at home as advised by the      patient's primary care physician.      Willette Cluster, NP      Iva Boop, MD,FACG  Electronically Signed    PG/MEDQ  D:  08/20/2008  T:  08/21/2008  Job:  [id number]

## 2010-06-16 NOTE — H&P (Signed)
[facility]  Patient:    [name] [name], [name] [name] Visit Number: [id number] MRN: [id number]          Service Type: MED Location: [phone] Attending Physician:  Nadean Corwin Dictated by:   Ammie Dalton, M.D. Adm. Date:  05/26/2000 Disc. Date: 06/05/2000                           History and Physical  CHIEF COMPLAINT:  Shortness of breath and chest pressure.  HISTORY OF PRESENT ILLNESS:  This is a 77 year old black female who does not have a regular primary care physician.  He has been seen in the emergency room on multiple occasions and appears to fail to follow up afterwards with either primary care or cardiologist.  He seems to have seen Dr. Osvaldo Shipper. Spruill on at least one occasion, according to the patient.  He is a 77 year old black female with history of congestive heart failure, hypertensive cardiomyopathy, with his last ejection fraction being 26% in 2000.  At approximately 2 a.m., four hours ago, he noticed increasing shortness of breath and chest pressure; he has also been noticing more dyspnea on exertion over the last week.  He has had three prior emergency room visits in the last five years for similar episodes.  His last echocardiogram in 1988 showed dilated cardiomyopathy with left ventricular hypertrophy, posterior pericardial effusion, 1+ mitral regurgitation and an ejection fraction of 26%.  He has had none since that time.  His last emergency room visit for malignant hypertension only was in November of 2001 and no chest x-ray was done at that time.  At that time, he apparently was given my name as potential physician for followup; this is how my name was given to the emergency room for evaluation.  PAST MEDICAL HISTORY:  Hypertension, hypertensive cardiomyopathy, congestive heart failure.  MEDICATIONS:  The patient had not been taking his medications over the last few days; he had run out.  He was supposed to be on Lotensin  and Lasix.  ALLERGIES:  No known drug allergies.  SOCIAL HISTORY:  He is single with a girlfriend who is very supportive. Denies alcohol, tobacco and drug use.  Works full-time at [employer].  Denies any IV drug use or vitamin use.  FAMILY HISTORY:  Positive for hypertension, coronary artery disease and strokes.  REVIEW OF SYSTEMS:  Patient denies nausea, vomiting, dizziness, abdominal pain, dysuria, palpitations, fever, chills, cough, cold symptoms or weight loss or gain.  He does have the history of the shortness of breath, dyspnea on exertion and chest pressure.  He denies chest pain, orthopnea, PND or diaphoresis.  PHYSICAL EXAMINATION:  GENERAL:  This is a large young black female in acute distress on initial evaluation in the emergency room, acutely short of breath.  VITAL SIGNS:  Blood pressure on arrival was 205/145, pulse 129 and regular, respirations 24 and labored, temperature 98.5, pulse oximetry 93% on 4 L of oxygen.  CHEST X-RAY:  Pulmonary edema and cardiomegaly.  LABORATORY DATA:  PCO2 43, PO2 75, bicarb 24, pH 7.38 on 4 L.  WBC 5.9, hemoglobin 18.  Sodium 140, potassium 3.4, creatinine 1.6.  CK 279 and CK-MB 3.5.  Troponin I 1.2.  EKG shows left ventricular hypertrophy with LV strain, normal sinus rhythm but tachycardic at 95.  ASSESSMENT AND PLAN:  This is an unfortunate 77 year old black female with severe hypertension and hypertensive cardiomyopathy who presents in acute pulmonary distress for  admission.  I will admit the patient in a hypertensive crisis and pulmonary edema into the cardiovascular intensive care unit and consult Dr. Peter M. [place] for further cardiovascular management.  1. Acute respiratory distress secondary to pulmonary edema:  Will diurese the    patient with intravenous Lasix and continue the nitroglycerin drip.    Continue the oxygenation and titrate as tolerated. 2. Hypertensive cardiomyopathy:  Will obtain an echocardiogram.  Dr.  [place]    was consulted.  Will obtain repeat electrocardiogram, CK-MB and troponin I.    Will give an aspirin now.  Likely, this is due to the hypertension and not    ischemic in light of his age but need to rule that out. 3. Polycythemia:  Apparently, according to the records, he has had this    before.  Will follow that to see if that is probably functional related to    his cardiomyopathy, but will need to further evaluate. 4. Hypokalemia:  Will replete. Dictated by:   Ammie Dalton, M.D. Attending Physician:  Nadean Corwin DD:  09/24/00 TD:  09/25/00 Job: [id number] [id number]

## 2010-06-16 NOTE — Discharge Summary (Signed)
[name] [name], [name] [name]                ACCOUNT NO.:  [id number]   MEDICAL RECORD NO.:  [id number]          PATIENT TYPE:  INP   LOCATION:  0464                         FACILITY:  [facility]   PHYSICIAN:  Barbette Hair. Arlyce Dice, M.D. [facility] OF BIRTH:  08-04-1929   DATE OF ADMISSION:  06/07/2004  DATE OF DISCHARGE:  06/09/2004                                 DISCHARGE SUMMARY   ADMISSION DIAGNOSES:  87.  A 77 year old white female with abdominal pain post colonoscopy and      polypectomy 48 hours previous with removal of recurrent adenomatous      rectal polyp.  Rule out colonic perforation.  Rule out post polypectomy      syndrome with inflammatory process.  2.  Diarrhea acute.  Etiology not clear.  Unusual with post polypectomy burn      syndrome.  Rule out infectious etiology.  3.  Adult onset diabetes mellitus.  4.  Hypertension.  5.  History of peripheral neuropathy.  6.  Hypothyroidism.   DISCHARGE DIAGNOSES:  1.  Resolving left lower quadrant abdominal pain post colonoscopy.  Computed      tomographic scan negative but with persistent symptoms rule out mild      post polypectomy burn syndrome versus mild diverticulitis aggravated by      colonoscopy.  2.  Diarrhea acute.  Etiology not clear.  Unusual with post polypectomy burn      syndrome.  Rule out infectious etiology.  3.  Adult onset diabetes mellitus.  4.  Hypertension.  5.  History of peripheral neuropathy.  6.  Hypothyroidism.  7.  Small right lung nodule noted incidentally on computed tomographic scan.  8.  Small left adrenal gland lesion noted incidentally on computed      tomographic scan.  9.  Asymmetry of the right vaginal wall noted incidentally on computed      tomographic scan.  Recommend followup computed tomographic scan in four      months.   CONSULTATIONS:  None.   PROCEDURES:  None.   HISTORY:  [name] [name] is a pleasant 77 year old white female known to Dr. Lina Sar and a primary patient of Dr. Milinda Cave with a  history of colon polyps.  She last underwent a colonoscopy in August 2004.  At that time she had a  large rectal polyp villous adenoma, which was removed piecemeal.  She had  pain following that procedure and did require an admission for post  polypectomy burn syndrome.   On Monday, Jun 05, 2004, she underwent a followup colonoscopy with Dr. Juanda Chance  and was found to have a small rectal polyp felt to be recurrent in the same  location approximately 10 mm and this was again removed piecemeal.  It was  felt to be a villous polyp within the anal canal.  She did have a small  amount of bleeding after removal.  She was also noted to have sigmoid  diverticulosis.  She had minimal discomfort after the procedure and later  that evening.  However, on the following day she developed some progressive  pain in the left lower  quadrant, which she describes as constant, sharp, and  aggravated by movement.  She rates it as 6/10.  She did have the onset of  diarrhea on the day of admission as well, which is quite unusual for her.  She noted a scant amount of blood on the tissue but no active rectal  bleeding.  She had several episodes of loose stools.  She had called the  office with complaints of pain and diarrhea and was advised to come to the  emergency room.  She was seen and evaluated and was found to be  hemodynamically stable but with significant left lower quadrant tenderness  and rebound.  She is therefore admitted for pain control and further  diagnostic evaluation.   LABORATORY STUDIES:  On Jun 07, 2004 WBC 5.3, hemoglobin 12.1, hematocrit of  35.1, platelets 218.  Followup on Jun 08, 2004 WBC 4.4, hemoglobin 11.7,  hematocrit of 34.2.  Pro time 12.1, INR of 0.9.  PTT of 28.  Electrolytes  within normal limits.  BUN 10, creatinine 0.6.  Liver function studies  normal.  Lipase 17.  Urinalysis negative.   X-RAY STUDIES:  A CT scan of the abdomen and pelvis on Jun 07, 2004 with a  small nodule at  the right base and a small left adrenal lesion.  Recommend  followup CT in four months.  A pelvic CT with asymmetry in the appearance of  the right vaginal wall.  It is difficult to assess on CT.  No acute pelvic  findings.  No evidence of inflammatory process.   HOSPITAL COURSE:  The patient was admitted to the service of Dr. Arlyce Dice, who  was covering the hospital.  She was kept NPO and scheduled for a CT scan of  the abdomen and pelvis on the day of admission.  She was started empirically  on IV Unasyn with concern for possible post polypectomy burn syndrome.  She  was given morphine as needed for pain.  She had a benign hospital course.  The following day she was feeling a bit better with less pain.  Her CT scan  was negative for any acute inflammatory process and no evidence of  perforation.  We gradually advanced her diet.  Her diarrhea had stopped and  the decision was made to discontinue the IV Unasyn.  The following day on  Jun 09, 2004 she was still complaining of some mild crampy abdominal  discomfort.  Again, no further diarrhea.  She was concerned with the  persistent pain, asking about the possibility of diverticulitis, etc.  She  felt that she was well enough to manage at home and was allowed to discharge  to home with instructions to limit her activity and take it easy over the  next four-five days.  She was to follow a soft bland diet and advance  gradually as tolerated.  She was to follow up in the office with Dr. Lina Sar on Jun 23, 2004 at 3:30 p.m. and to call for any problems in the  interim.   MEDICATIONS ON DISCHARGE:  1.  Robinul Forte 2 mg q.a.m. for spasm and a second dose as needed at      dinnertime.  2.  She was discharged on Cipro 500 mg b.i.d. x7 days for the possibility of      a mild diverticulitis.  3.  She was restart her Glucophage on Jun 10, 2004.  4.  Extra Strength Tylenol as needed for pain. 5.  Other  medications as previous.   CONDITION ON  DISCHARGE:  Stable and improved.     AE/MEDQ  D:  06/14/2004  T:  06/14/2004  Job:  [id number]

## 2010-06-16 NOTE — H&P (Signed)
[name] [name]                ACCOUNT NO.:  [id number]   MEDICAL RECORD NO.:  [id number]          PATIENT TYPE:  EMS   LOCATION:  ED                           FACILITY:  [facility]   PHYSICIAN:  Barbette Hair. Arlyce Dice, M.D. [facility] OF BIRTH:  November 14, 1929   DATE OF ADMISSION:  06/07/2004  DATE OF DISCHARGE:                                HISTORY & PHYSICAL   CHIEF COMPLAINT:  Acute left lower quadrant abdominal pain, status post  colonoscopy done Jun 05, 2004.   HISTORY:  [name] [name] is a pleasant 77 year old white female known to Dr. Lina Sar, a primary patient of Dr. Milinda Cave, who has a history of colon polyps.  She had her last colonoscopy done in August, 2004, and at that time had a  large rectal polyp, which was removed piecemeal.  She had pain post  procedure and required an admission for a post polypectomy burn syndrome.  On Monday, May 8, she underwent follow-up colonoscopy per Dr. Juanda Chance and was  found to have a small rectal polyp, approximately 10 mm, at the same site,  and this was removed again piecemeal, felt to be a villous polyp within the  anal canal.  There was a small amount of bleeding after removal.  She also  has sigmoid colon diverticulosis.  She did fine with the procedure, had  minimal discomfort afterwards and on Monday evening.  Yesterday she had  progressive pain in the left lower quadrant, which she describes as  constant, sharp with movement, and this has persisted through today.  She  rates the pain as a 6/10.  She also had the onset of diarrhea today, which  is quite unusual for her.  She is noting a scant amount of blood on the  tissue but no active bleeding.  She has had several episodes of loose stools  through the day.  She has not had any fevers or chills.  No abdominal  distention.  No nausea or vomiting.  She called the office, was advised to  come to the emergency room, and was seen and evaluated here.  Is  hemodynamically stable but with left lower  quadrant tenderness and rebound.  Therefore, concerned for perforation or post polypectomy burns and is  admitted.   CURRENT MEDICATIONS:  1.  Verapamil 240 daily.  2.  Amaryl 4 mg daily p.r.n. for a CBG greater than 90.  3.  Zocor 40 daily.  4.  Metformin ER 500 mg 4 tablets with dinner.  5.  Synthroid 88 mcg daily.  6.  Aspirin 81 mg daily.  7.  Tums on a daily basis.  8.  Neurontin 300 mg b.i.d. p.r.n.  9.  Advair 250/50 once daily.   ALLERGIES:  CODEINE, which causes nausea and vomiting.   PAST MEDICAL HISTORY:  Pertinent for adult-onset diabetes mellitus,  hypertension, peripheral neuropathy, hyperlipidemia, hyperthyroidism.  She  has had a remote appendectomy and a remote tubal pregnancy with unilateral  oophorectomy.  Colon polyps and diverticulosis, as outlined above.   FAMILY HISTORY:  Noncontributory.   SOCIAL HISTORY:  The patient is widowed, lives  alone.  Has a supportive  daughter-in-law and daughter.  No tobacco or ETOH.   REVIEW OF SYSTEMS:  CARDIOVASCULAR:  Denies any chest pain or anginal  symptoms.  PULMONARY:  Denies any cough, shortness of breath, or sputum  production.  GENITOURINARY:  Denies any dysuria, urgency, or frequency.  GI:  As outlined above.  She says the diarrhea is quite unusual for her, that she  is generally constipated.   PHYSICAL EXAMINATION:  VITAL SIGNS:  Temperature 97.8, blood pressure  145/77, pulse 77, O2 sat 97 on room air.  GENERAL:  A well-developed white female in no acute distress.  Alert and  oriented.  She is able to sit up comfortably.  HEENT:  Normocephalic and atraumatic.  PERRLA.  EOMI.  Sclerae are  anicteric.  RESPIRATORY:  Clear to A&P.  CARDIOVASCULAR:  Regular rate and rhythm with S1 and S2.  No murmur, rub or  gallop.  ABDOMEN:  Soft and tender in the left lower quadrant with minimal rebound.  There is no distention.  She does have bowel sounds.  No palpable  organomegaly.  RECTAL:  Not done.  EXTREMITIES:   Without clubbing, cyanosis or edema.   Laboratory studies are pending.   IMPRESSION:  71.  A 77 year old white female with abdominal pain post colonoscopy and      polypectomy 48 hours ago with removal of a recurrent rectal adenomatous      polyp, rule out colonic perforation.  Rule out post polypectomy syndrome      with inflammatory process.  2.  Diarrhea, etiology not clear.  Unusual with the post polypectomy burns.      Will check stool cultures and stool for Clostridium difficile.  3.  Adult-onset diabetes mellitus.  4.  Hypertension.  5.  History of peripheral neuropathy.  6.  Hyperthyroidism.   PLAN:  Patient is admitted to the service of Dr. Melvia Heaps for IV fluid  hydration, pain control.  Will cover with IV Unasyn.  Obtain stool cultures.  Check a CT scan of the abdomen and pelvis this evening.  For further  details, please see the orders.       AE/MEDQ  D:  06/07/2004  T:  06/07/2004  Job:  [id number]

## 2010-06-16 NOTE — Op Note (Signed)
NAME:  [name] [name], [name] [name]                          ACCOUNT NO.:  [id number]   MEDICAL RECORD NO.:  [id number]                   PATIENT TYPE:  INP   LOCATION:  0480                                 FACILITY:  [facility]   PHYSICIAN:  Lina Sar, M.D. [facility]               DATE OF BIRTH:  05-08-1929   DATE OF PROCEDURE:  DATE OF DISCHARGE:                                 OPERATIVE REPORT   PROCEDURE:  Upper endoscopy.   INDICATIONS FOR PROCEDURE:  This 77 year old white female has been  hospitalized with abdominal pain several days after colonoscopy and a large  rectal polypectomy showing high grade dysplasia in the rectal polyp. The  etiology of the pain has not been clear. CT scans of the abdomen did not  show any inflammatory process in the peritoneum. Ultrasound of the  gallbladder was essentially normal. She has complained of hiccups and  epigastric pain. She has been on acid suppressing agents. She is undergoing  upper endoscopy to avoid her epigastric pain.   ENDOSCOPE:  Olympus single channel video endoscope.   SEDATION:  Versed 5 mg IV, fentanyl 75 mcg IV.   FINDINGS:  Olympus single channel videoscope passed under direct vision  through posterior pharynx into the esophagus. The patient was monitored by  pulse oximeter. Her oxygen saturations were normal. Her gag reflex was  absent due to topical anesthetic. The proximal and distal esophagus showed  scattered whitish specks of exudate consistent with Candida esophagitis.  This could not be washed out. They were adherent to the wall of the  esophagus and there was some friable mucosa underneath the exudate. There  were no vesicles or ulcerations. The squamocolumnar junction was normal.   STOMACH:  The stomach was insufflated and showed normal appearing gastric  folds, antrum and pyloric outlet. Retroflexion of the endoscope revealed  normal fundus and cardia. Minimal erythema was found in the prepyloric  antrum, biopsies were taken  for CLOtest.   DUODENUM:  The duodenal bulb and descending duodenum was unremarkable.   IMPRESSION:  1. Candida esophagitis status post brushings.  2. Status post CLOtest.    PLAN:  The above findings do not explain the patient's abdominal pain. She  was to continue on the Protonix. We will start Diflucan 100 mg a day and she  will be discharged on diabetic diet. The patient will also followup with Dr.  Luretha Murphy for deep excision of the rectal lesion.                                               Lina Sar, M.D. [facility]    DB/MEDQ  D:  10/01/2002  T:  10/01/2002  Job:  [id number]   cc:   Barbette Hair. Arlyce Dice, M.D. [facility]   [place].  Daphine Deutscher, M.D.  [address]  [place]  [place] [address]  Fax: [phone]   Lucky Cowboy, M.D.  [address]  [place], [place] [address]  Fax: [phone]

## 2010-06-16 NOTE — Discharge Summary (Signed)
NAME:  [name] [name], [name] [name]                          ACCOUNT NO.:  [id number]   MEDICAL RECORD NO.:  [id number]                   PATIENT TYPE:  INP   LOCATION:  0480                                 FACILITY:  [facility]   PHYSICIAN:  Lina Sar, M.D. [facility]               DATE OF BIRTH:  Feb 05, 1929   DATE OF ADMISSION:  09/27/2002  DATE OF DISCHARGE:  10/01/2002                                 DISCHARGE SUMMARY   ADMISSION DIAGNOSES:  76. A 77 year old female with persistent lower abdominal pain, status post     colonoscopy and rectal polypectomy on September 22, 2002, rule out     perforation, abscess, or post-polypectomy burn syndrome.  2. Asthma with a history of asthmatic bronchitis.  3. Diverticulosis.  4. Status post appendectomy and unilateral oophorectomy.  5. Hypertension.  6. Hyperlipidemia.  7. Hypothyroidism.  8. Adult onset diabetes mellitus with a history of peripheral neuropathy.   DISCHARGE DIAGNOSES:  1. 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  The patient is status post polypectomy of     large rectal polyp on September 22, 2002.  2. 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  3. Mild Candida esophagitis.  4. Gallbladder sludge with no evidence for cholecystitis.   CONSULTATIONS:  Dr. Daphine Deutscher, surgery.   PROCEDURES:  1. CT scan of the abdomen and pelvis on September 28, 2002 and September 29, 2002.  2. Plain abdominal films.  3. Upper abdominal ultrasound.   HISTORY:  [name] [name] is a 77 year old white female known to Dr. Melvia Heaps,  primary patient of Dr. Oneta Rack, with history as outlined above.  She had  recently been referred to Dr. Arlyce Dice with a two to three month history of  rectal bleeding and a protrusion from her rectum with bowel movements.  Colonoscopy was done as an outpatient on September 22, 2002, at the [facility]  revealing a 30 mm  plus rectal polyp which was removed piecemeal.  She was  also noted to have descending colon diverticulosis.  She says that she did  have abdominal discomfort starting after the procedure which did not abate  and actually had progressed for the three to four days post procedure.  She  had some mild associated nausea without vomiting, no fever documented.  Her  appetite had been fair, and she had been eating light.  She denied any  increase in her abdominal symptoms with p.o. intake.  She did have some  diarrhea one day, no significant rectal bleeding.  She states that the pain  across her lower abdomen has been fairly constant, and is now radiating up  into the epigastrium, and over the past 24 to 48 hours prior to admission  rated this as an 8/10.  She says it was worse with movement  and/or any  jarring activity, i.e., riding in the car, walking, etc.  She had called our  office on September 25, 2002, and had spoken with the nurse and when her  symptoms did not improve she came to the emergency room on September 26, 2002.  She was seen and evaluated by Dr. 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 3, plain abdominal films were negative.  She was asked to stay on a  clear liquid diet.  She called back the following day stating that she was  not having any improvement in her symptoms, the pain medication was not  helping, and she was admitted to the hospital for further diagnostic workup  and pain control.   LABORATORY DATA:  On September 27, 2002, WBC of 4.8, hemoglobin 12.3,  hematocrit of 36.8, MCV of 92.2, platelets 232.  Serial values were obtained  on September 30, 2002, WBC was 3.7, hemoglobin 12, hematocrit of 34.7, MCV of  89.5.  Electrolytes on admission were within normal limits.  BUN 9,  creatinine 0.7, albumin 3.5.  Liver function studies normal.  Amylase 39.   Urinalysis showed greater then 80 ketones,  otherwise negative on admission.   Plain abdominal films on September 28, 2002, showed a possible localized ileus  involving solitary loop of jejunum in the left upper quadrant, no evidence  of obstruction or free air.   CT scan of the abdomen and pelvis on admission, September 28, 2002, showed no  acute findings in the abdomen, mildly distended gallbladder, no evidence of  inflammatory process, free fluid, or free air, no acute findings in the  pelvis.   Followup CT scan done 36 hours later due to persistent severe pain showed a  single mildly distended gallbladder, stable small bilateral renal cysts, and  a negative pelvic CT with the exception of colon diverticula without  evidence of diverticulitis or free peritoneal fluid.   Upper abdominal ultrasound done on September 30, 2002, showed a slightly  prominent gallbladder with some sludge, common bile duct normal, liver  normal, gallbladder wall thickness normal at 1.8 mm, simple cyst of the  right kidney.   HOSPITAL COURSE:  The patient was admitted to the hospital with persistent  complaints of pain post colonoscopy and removal of a large rectal polyp.  She was admitted by Dr. Arlyce Dice, placed n.p.o., started on IV Unasyn, given  Demerol and Phenergan as needed for control of pain and nausea, and  scheduled for CT of the abdomen and pelvis with results as outlined above.  The next couple of days, she continued to complain of fairly severe  abdominal pain, though continued to have a normal white count.  She was  afebrile, and no acute findings were seen on CT.  Due to the severity of her  pain, we did repeat the CT scan which again was negative.  Pathology on her  polyp returned consistent with high-grade dysplasia, and we consulted Dr.  Daphine Deutscher for surgery as it was felt that she would eventually need a transanal  resection.  Dr. Laurence Compton concurred that there was not any evidence for any inflammatory processes to account for her abdominal  pain, and felt she  should recuperate from her acute symptoms prior to considering resection of  the polyp.  He also concurred with IV Unasyn and continued observation.  On  September 30, 2002, after attempting some p.o., she complained of some  increased pain again focusing it at this point more on epigastric  discomfort.  We obtained abdominal ultrasound which did show some biliary  sludge, but normal wall thickness, no evidence for cholecystitis.  Dr.  Juanda Chance proceeded with upper endoscopy to rule out peptic ulcer disease, etc.  She was found to have some mild Candida esophagitis, and otherwise negative  exam.  This was completed on October 01, 2002.  At that point, she was felt  to be stable and the patient and her family felt that they could manage her  pain at home with oral analgesics.  She was discharged to home in a stable  condition with instructions to follow up with Dr. Arlyce Dice in the office on  October 07, 2002, at 4:15 p.m., to follow up with Dr. Daphine Deutscher on October 15, 2002, and 10:30 a.m.  She was to call for any problems in the interim  with increased abdominal pain, fever, vomiting, etc.   DISCHARGE MEDICATIONS:  1. Diflucan 100 mg p.o. q.d. x5 days.  2.     She was to resume her Levoxyl, Neurontin, Amaryl, Advair, Glucophage, Zocor,     and Verapamil as previous.  3. She was given Percocet one q.4-6h. as needed for pain.  4. Imodium for diarrhea if needed.       Mike Gip, P.A.-C. [facility]                Lina Sar, M.D. [facility]    AE/MEDQ  D:  10/08/2002  T:  10/08/2002  Job:  [id number]   cc:   [place] Daphine Deutscher, M.D.  [address]  [place]  [place] [address]  Fax: [phone]

## 2010-06-16 NOTE — H&P (Signed)
NAME:  [name] [name], [name] [name]                          ACCOUNT NO.:  [id number]   MEDICAL RECORD NO.:  [id number]                   PATIENT TYPE:  INP   LOCATION:  0480                                 FACILITY:  [facility]   PHYSICIAN:  Barbette Hair. Arlyce Dice, M.D. [facility]          DATE OF BIRTH:  05-Jul-1929   DATE OF ADMISSION:  09/27/2002  DATE OF DISCHARGE:                                HISTORY & PHYSICAL   CHIEF COMPLAINT:  Abdominal pain, post colonoscopy with polypectomy.   HISTORY:  [name] [name] is a pleasant 77 year old white female known to [employer].  Arlyce Dice, M.D. [facility], primary patient of Lucky Cowboy, M.D. with history of  diabetes, hypertension, peripheral neuropathy, hyperlipidemia,  hypothyroidism.  She is status post appendectomy remotely and remote ectopic  pregnancy with unilateral oophorectomy.   The patient was referred recently to Barbette Hair. Arlyce Dice, M.D. [facility] for two to  three month history of rectal bleeding and a protrusion from her rectum.  Colonoscopy was done on September 22, 2002 at the [facility] revealing a 30 mm rectal  polyp which was removed piecemeal.  She was also noted to have descending  colon diverticula.  She notes that she has had abdominal discomfort starting  after the procedure which did not abate and actually progressed by August 27  through August 29.  She has not had any vomiting.  Did have some mild  nausea.  No fever.  Positive chills x1.  Her appetite has been fair.  She  has been eating but says that she has been eating light and does not note  any change in her pain with p.o. intake.  She had diarrhea on September 25, 2002.  No significant bleeding.  Has seen a small amount of blood on the  tissue.  She complains of pain across her lower abdomen which has been  constant radiating into the epigastrium.  Rates this as an 8/10 over the  past 24-48 hours.  It had been worse with movement or jarring.  She had  called the office on August 27 and according to patient and her  daughter-in-  law she did speak to the nurse.  When her symptoms did not improve on August  28 she came to the emergency room.  Was seen and evaluated by Barbette Hair.  Arlyce Dice, M.D. [facility] who was covering on-call.  Felt to have a post polypectomy  syndrome.  Was started on oral antibiotic and Tylenol No.3.  She did have  abdominal films done which were negative.  She was also asked to stay on a  clear liquid diet.  She called back on September 27, 2002 with no improvement  and stated that the pain medication was not helping and at this time she is  admitted for further diagnostic evaluation and pain control.   CURRENT MEDICATIONS:  1. Vicodin p.r.n.  2. Bactrim DS one p.o. b.i.d.  3. Levoxyl 0.88 mcg daily.  4. Neurontin 300 mg p.r.n.  5. Amaryl 4 mg daily.  6. Advair 250/50 b.i.d.  7. Glucophage ER 500 mg four tablets at h.s.  8. Zocor 40 daily.  9. Aspirin 81 mg daily.  10.      Multivitamin daily.  11.      Verapamil SR 240 daily.   ALLERGIES:  CODEINE which causes nausea and vomiting.   PAST MEDICAL HISTORY:  As outlined above.  She does have a history of  asthmatic bronchitis for which she was admitted in 2002.  Also, had had a  prior orthopedic surgery after a fall several years ago.   SOCIAL HISTORY:  The patient is widowed.  Lives alone.  Has very supportive  family.  No tobacco.  No ETOH.   FAMILY HISTORY:  Negative for GI disease.   REVIEW OF SYSTEMS:  CARDIOVASCULAR:  Denies any chest pain or anginal  symptoms currently.  PULMONARY:  Negative for cough, shortness of breath, or  sputum production.  GENITOURINARY:  Negative for dysuria, urgency, or  frequency.  GASTROINTESTINAL:  As noted above.   PHYSICAL EXAMINATION:  GENERAL:  Well-developed, elderly white female in no  acute distress.  She is alert and oriented x3.  VITAL SIGNS:  Temperature 98.7, blood pressure 129/67, pulse 68,  respirations 20.  HEENT:  Normocephalic, atraumatic.  EOMI.  PERLA.  Sclerae anicteric.   NECK:  Supple.  She is somewhat pale.  She does have a scar on the right  anterior neck.  CARDIOVASCULAR:  Regular rate and rhythm with S1 and S2.  PULMONARY:  Clear to A&P.  ABDOMEN:  Soft.  Bowel sounds are active.  She is tender across the lower  abdomen and in the epigastrium.  There is no guarding or rebound.  No  palpable mass or hepatosplenomegaly.  She has midline and right lower  quadrant incisional scars.  RECTAL:  Not done at this time.  See colonoscopy report.  EXTREMITIES:  Without clubbing, cyanosis, edema.  NEUROLOGIC:  Grossly nonfocal.   IMPRESSION:  7. A 77 year old white female with lower abdominal pain post colonoscopy and     rectal polypectomy on September 22, 2002.  Rule out perforation, abscess, or     post polypectomy burn syndrome.  2. Asthma with history of asthmatic bronchitis.  3. Diverticulosis.  4. Status post appendectomy and unilateral oophorectomy.  5. Hypertension.  6. Hyperlipidemia.  7. Hypothyroidism.  8. Adult-onset diabetes mellitus with history of peripheral neuropathy.   PLAN:  The patient is admitted to the service of Barbette Hair. Arlyce Dice, M.D. [facility]  for CT scan of the abdomen and pelvis, bowel rest, IV antibiotics, baseline  laboratory studies.  For further details please see the orders.     Mike Gip, P.A.-C. [facility]                Robert D. Arlyce Dice, M.D. [facility]    AE/MEDQ  D:  09/28/2002  T:  09/28/2002  Job:  [id number]   cc:   Lucky Cowboy, M.D.  [address]  [place], [place] [address]  Fax: [phone]

## 2010-06-16 NOTE — Discharge Summary (Signed)
[facility]  Patient:    [name] [name], [name] [name]                       MRN: [id number] Adm. Date:  [id number] Disc. Date: [id number] Attending:  Nadean Corwin CC:         Ammie Dalton, M.D.   Discharge Summary  FINAL DIAGNOSES: 1. Asthmatic tracheobronchitis with mild respiratory insufficiency. 2. Insulin-dependent diabetes mellitus, poor control. 3. History of hypertension.  PROCEDURES:  None.  COMPLICATIONS:  None.  CONSULTATIONS:  None.  HOSPITAL COURSE:  Patient is a very nice 77 year old widowed white female with history of hypertension and asthma who was seen as an outpatient several days preceding admission with upper and lower respiratory infection and was initiated on treatment with Z-Pak and steroid pulse/taper and expectorant/cough suppressants.  Patient initially remained stable and, on the day of admission, patient presented to the emergency room in mild respiratory distress with labored breathing, further reporting her sugars had been elevated for which she had been taking supplemental oral agents which she had taken on a p.r.n. basis in the past.  For details, see admission note.  On admission, patient had been initiated on parenteral antibiotics and was given nebulized bronchodilator treatments and O2 saturations improved with less complaints of dyspnea with blood sugars remaining elevated covering the sliding scale per schedule.  Patients vital signs otherwise remained stable and she was continued on parenteral intravenous high-dose steroids because of her persistent wheezing and closely monitored for her variable hyperglycemia with sliding scale coverage.  Patient continued to improve and seemed to tolerate diet with persistent hyperglycemia despite adding metformin and longer-acting intermediate insulins, continuing to supplement with sliding scale coverage.  As her wheezing persisted, she was initiated on  intravenous theophylline by bolus followed by infusion with monitoring of serum theophylline levels.  As patients condition gradually improved, she was switched to oral insulin.  She received teaching and insulin administration by the nursing staff.  Her condition gradually improved and she did require several manipulations of her insulin dosing with glucoses finally coming in control and subsequent tapering of her baseline intermediate insulin dosing. As patient remained active and tending to activities of daily living and condition gradually improved, arrangements were made for discharge to follow up as an outpatient to continue nebulized bronchodilator treatment.  Of note, hemoglobin A1C measured 8.3% suggesting some longevity to her hyperglycemia projecting poor control preceding her recent illness and steroid treatment. Also, as she persisted with dry, hacking, nonproductive cough, she had Mucomyst treatments added to her inhalant bronchodilator therapy.  ACCESSORY CLINICAL FINDINGS:  Chest x-ray finding mild cardiomegaly with no infiltrates.  Theophylline level measured May 5, oral theophylline was 16.3% in safe therapeutic range.  Room air blood gas the day following admission on May 1 showed pH 7.456, PO2 67, PCO2 32, correcting O2 to 59 mmHg.  On May 4, CBC found normal hemoglobin 13.8 g percent, WBC 10,500 with slight leftward shift. This was attributed to steroid therapy and CMET showed fasting glucose 345 mg percent and, otherwise, no significant abnormalities.  Hemoglobin A1C 8.3% as above.  Several theophylline levels were monitored during course of therapy. Sputum culture grew normal oropharyngeal flora.  DISPOSITION:  Patient was discharged home to continue Humulin N 35 units with breakfast, 15 units with supper, and given prescription for Tequin 400 mg tablets #5, take one daily p.c., Glucophage 500 mg XR x 4 equal 2000 mg after supper, Theo-Dur 300 mg b.i.d.  breakfast  and supper, Xanax 0.25 mg tablets a half to one t.i.d. p.r.n. anxiety or tremor, Histussin HC expectorant 5-10 cc q.4h. p.r.n. cough, and to resume prehospitalization medications including Verapamil SR 240 mg p.c., Doxepin 50 mg q.h.s., Synthroid daily, Neurontin 600 mg t.i.d., and FemHRT.  Patient also advised 1500-calorie ADA diet, monitoring CBGs on a b.i.d. regimen before breakfast and supper keeping list. Also, to continue Advair 500/50 one or two inhalations twice a day, and nebulized bronchodilator therapy with albuterol 2.5 mg, ipratropium 0.5 mg q.i.d. or q.4h.  Advised liberal intake and advance activity as tolerated, and return for office follow-up one week post discharge or call sooner if problems.  CONDITION:  Stable, improved.  PROGNOSIS:  Good. DD:  06/04/00 TD:  06/05/00 Job: [id number]

## 2010-07-14 ENCOUNTER — Other Ambulatory Visit (HOSPITAL_COMMUNITY): Payer: Self-pay | Admitting: Internal Medicine

## 2010-07-14 DIAGNOSIS — Z1231 Encounter for screening mammogram for malignant neoplasm of breast: Secondary | ICD-10-CM

## 2010-08-03 ENCOUNTER — Ambulatory Visit (HOSPITAL_COMMUNITY)
Admission: RE | Admit: 2010-08-03 | Discharge: 2010-08-03 | Disposition: A | Discharge status: Home / Self Care | Payer: Medicare Other | Source: Ambulatory Visit | Attending: Internal Medicine | Admitting: Internal Medicine

## 2010-08-03 DIAGNOSIS — Z1231 Encounter for screening mammogram for malignant neoplasm of breast: Secondary | ICD-10-CM | POA: Insufficient documentation

## 2010-08-10 ENCOUNTER — Other Ambulatory Visit: Payer: Self-pay | Admitting: Neurology

## 2010-08-10 DIAGNOSIS — F0150 Vascular dementia without behavioral disturbance: Secondary | ICD-10-CM

## 2010-08-10 DIAGNOSIS — F29 Unspecified psychosis not due to a substance or known physiological condition: Secondary | ICD-10-CM

## 2010-08-15 ENCOUNTER — Ambulatory Visit
Admission: RE | Admit: 2010-08-15 | Discharge: 2010-08-15 | Disposition: A | Discharge status: Home / Self Care | Payer: 59 | Source: Ambulatory Visit | Attending: Neurology | Admitting: Neurology

## 2010-08-15 DIAGNOSIS — F0150 Vascular dementia without behavioral disturbance: Secondary | ICD-10-CM

## 2010-08-15 DIAGNOSIS — F29 Unspecified psychosis not due to a substance or known physiological condition: Secondary | ICD-10-CM

## 2010-10-20 LAB — INFLUENZA A+B VIRUS AG-DIRECT(RAPID): Inflenza A Ag: NEGATIVE

## 2010-10-20 LAB — CBC
HCT: 34.6 — ABNORMAL LOW
MCHC: 34.2
MCV: 88.9
MCV: 88.9
Platelets: 204
RBC: 3.89
RDW: 12.8
WBC: 11.9 — ABNORMAL HIGH
WBC: 9.4

## 2010-10-20 LAB — DIFFERENTIAL
Basophils Absolute: 0.1
Basophils Relative: 1
Basophils Relative: 0
Eosinophils Absolute: 0.1
Eosinophils Absolute: 0.2
Eosinophils Relative: 1
Eosinophils Relative: 2
Lymphocytes Relative: 15
Neutrophils Relative %: 89 — ABNORMAL HIGH

## 2010-10-20 LAB — COMPREHENSIVE METABOLIC PANEL
AST: 17
Alkaline Phosphatase: 84
CO2: 28
Chloride: 100
Creatinine, Ser: 0.73
GFR calc Af Amer: >60
GFR calc non Af Amer: >60
Potassium: 4
Total Bilirubin: 0.5

## 2010-10-20 LAB — PROTIME-INR: Prothrombin Time: 13.2

## 2010-10-23 LAB — ACTH: C206 ACTH: <5 — ABNORMAL LOW

## 2010-10-23 LAB — CROSSMATCH
ABO/RH(D): A POS
Antibody Screen: NEGATIVE

## 2010-10-23 LAB — BLOOD GAS, ARTERIAL
Acid-base deficit: 3 — ABNORMAL HIGH
Drawn by: 14519
FIO2: 0.21
pCO2 arterial: 33.5 — ABNORMAL LOW
pO2, Arterial: 76.3 — ABNORMAL LOW

## 2010-10-23 LAB — CBC
MCHC: 33.7
MCHC: 34.5
MCV: 89.8
MCV: 90
Platelets: 418 — ABNORMAL HIGH
Platelets: 435 — ABNORMAL HIGH
RBC: 2.93 — ABNORMAL LOW
RDW: 14.8
WBC: 7.8
WBC: 8.6

## 2010-10-23 LAB — FUNGUS CULTURE W SMEAR

## 2010-10-23 LAB — MISCELLANEOUS TEST

## 2010-10-23 LAB — PROLACTIN: Prolactin: 4.4

## 2010-10-23 LAB — AFB CULTURE WITH SMEAR (NOT AT ARMC)

## 2010-10-23 LAB — URINALYSIS, ROUTINE W REFLEX MICROSCOPIC
Glucose, UA: 250 — AB
pH: 5.5

## 2010-10-23 LAB — COMPREHENSIVE METABOLIC PANEL
ALT: 19
AST: 18
Alkaline Phosphatase: 105
CO2: 25
Calcium: 9.1
GFR calc Af Amer: >60
Potassium: 3.4 — ABNORMAL LOW
Sodium: 141
Total Protein: 5.4 — ABNORMAL LOW

## 2010-10-23 LAB — URINE MICROSCOPIC-ADD ON

## 2010-10-23 LAB — MAGNESIUM: Magnesium: 1.5

## 2010-10-23 LAB — BASIC METABOLIC PANEL
BUN: 13
BUN: 8
Chloride: 110
Creatinine, Ser: 0.6
Creatinine, Ser: 0.61
GFR calc non Af Amer: >60

## 2010-10-23 LAB — T3 UPTAKE: T3 Uptake Ratio: 38.3 — ABNORMAL HIGH

## 2010-10-23 LAB — CULTURE, RESPIRATORY W GRAM STAIN

## 2010-10-23 LAB — PHOSPHORUS: Phosphorus: 4

## 2010-10-23 LAB — ABO/RH: ABO/RH(D): A POS

## 2010-10-23 LAB — IRON AND TIBC: UIBC: 247

## 2011-04-19 ENCOUNTER — Other Ambulatory Visit (HOSPITAL_COMMUNITY): Payer: Self-pay | Admitting: Internal Medicine

## 2011-04-19 ENCOUNTER — Ambulatory Visit (HOSPITAL_COMMUNITY)
Admission: RE | Admit: 2011-04-19 | Discharge: 2011-04-19 | Disposition: A | Discharge status: Home / Self Care | Payer: Medicare Other | Source: Ambulatory Visit | Attending: Internal Medicine | Admitting: Internal Medicine

## 2011-04-19 DIAGNOSIS — R0609 Other forms of dyspnea: Secondary | ICD-10-CM | POA: Insufficient documentation

## 2011-04-19 DIAGNOSIS — R0600 Dyspnea, unspecified: Secondary | ICD-10-CM

## 2011-04-19 DIAGNOSIS — R0602 Shortness of breath: Secondary | ICD-10-CM | POA: Insufficient documentation

## 2011-04-19 DIAGNOSIS — R0989 Other specified symptoms and signs involving the circulatory and respiratory systems: Secondary | ICD-10-CM | POA: Insufficient documentation

## 2011-04-19 DIAGNOSIS — J984 Other disorders of lung: Secondary | ICD-10-CM | POA: Insufficient documentation

## 2012-01-11 ENCOUNTER — Other Ambulatory Visit: Payer: Self-pay | Admitting: Internal Medicine

## 2012-01-11 DIAGNOSIS — M545 Low back pain: Secondary | ICD-10-CM

## 2012-01-14 ENCOUNTER — Ambulatory Visit
Admission: RE | Admit: 2012-01-14 | Discharge: 2012-01-14 | Disposition: A | Discharge status: Home / Self Care | Payer: Medicare Other | Source: Ambulatory Visit | Attending: Internal Medicine | Admitting: Internal Medicine

## 2012-01-14 DIAGNOSIS — M545 Low back pain: Secondary | ICD-10-CM

## 2013-01-26 ENCOUNTER — Other Ambulatory Visit: Payer: Self-pay | Admitting: Internal Medicine

## 2013-01-30 ENCOUNTER — Other Ambulatory Visit: Payer: Self-pay | Admitting: Internal Medicine

## 2013-02-01 ENCOUNTER — Encounter: Payer: Self-pay | Admitting: Physician Assistant

## 2013-02-01 DIAGNOSIS — F32A Depression, unspecified: Secondary | ICD-10-CM | POA: Insufficient documentation

## 2013-02-01 DIAGNOSIS — E559 Vitamin D deficiency, unspecified: Secondary | ICD-10-CM | POA: Insufficient documentation

## 2013-02-01 DIAGNOSIS — F0390 Unspecified dementia without behavioral disturbance: Secondary | ICD-10-CM | POA: Insufficient documentation

## 2013-02-01 DIAGNOSIS — F329 Major depressive disorder, single episode, unspecified: Secondary | ICD-10-CM

## 2013-02-04 ENCOUNTER — Encounter: Payer: Self-pay | Admitting: Internal Medicine

## 2013-02-04 ENCOUNTER — Ambulatory Visit (INDEPENDENT_AMBULATORY_CARE_PROVIDER_SITE_OTHER): Payer: Medicare Other | Admitting: Internal Medicine

## 2013-02-04 VITALS — BP 124/56 | HR 84 | Temp 98.1°F | Resp 18 | Wt 146.8 lb

## 2013-02-04 DIAGNOSIS — Z79899 Other long term (current) drug therapy: Secondary | ICD-10-CM

## 2013-02-04 DIAGNOSIS — E119 Type 2 diabetes mellitus without complications: Secondary | ICD-10-CM

## 2013-02-04 DIAGNOSIS — D649 Anemia, unspecified: Secondary | ICD-10-CM

## 2013-02-04 DIAGNOSIS — E538 Deficiency of other specified B group vitamins: Secondary | ICD-10-CM

## 2013-02-04 DIAGNOSIS — E782 Mixed hyperlipidemia: Secondary | ICD-10-CM

## 2013-02-04 DIAGNOSIS — E559 Vitamin D deficiency, unspecified: Secondary | ICD-10-CM

## 2013-02-04 DIAGNOSIS — I10 Essential (primary) hypertension: Secondary | ICD-10-CM

## 2013-02-04 LAB — CBC WITH DIFFERENTIAL/PLATELET
Basophils Absolute: 0 10*3/uL (ref 0.0–0.1)
Basophils Relative: 0 % (ref 0–1)
EOS PCT: 3 % (ref 0–5)
Eosinophils Absolute: 0.1 10*3/uL (ref 0.0–0.7)
HCT: 32 % — ABNORMAL LOW (ref 36.0–46.0)
HEMOGLOBIN: 10.9 g/dL — AB (ref 12.0–15.0)
LYMPHS ABS: 0.7 10*3/uL (ref 0.7–4.0)
LYMPHS PCT: 12 % (ref 12–46)
MCH: 30.4 pg (ref 26.0–34.0)
MCHC: 34.1 g/dL (ref 30.0–36.0)
MCV: 89.4 fL (ref 78.0–100.0)
MONOS PCT: 11 % (ref 3–12)
Monocytes Absolute: 0.6 10*3/uL (ref 0.1–1.0)
NEUTROS PCT: 74 % (ref 43–77)
Neutro Abs: 4 10*3/uL (ref 1.7–7.7)
PLATELETS: 261 10*3/uL (ref 150–400)
RBC: 3.58 MIL/uL — AB (ref 3.87–5.11)
RDW: 13.3 % (ref 11.5–15.5)
WBC: 5.3 10*3/uL (ref 4.0–10.5)

## 2013-02-04 MED ORDER — AZITHROMYCIN 250 MG PO TABS: ORAL_TABLET | ORAL | Status: AC

## 2013-02-04 NOTE — Patient Instructions (Signed)

## 2013-02-04 NOTE — Progress Notes (Signed)
Patient ID: [name] [name], female   DOB: Apr 13, 1929, 80 y.o.   MRN: [id number]   This very nice 80 y.o. [facility] presents for 3 month follow up with Hypertension, Hyperlipidemia, Pre-Diabetes, Vascular Dementia - Alzheimer Type and Vitamin D Deficiency.    HTN predates since 68. BP has been controlled at home. Today's BP: 124/56 mmHg . Patient denies any cardiac type chest pain, palpitations, dyspnea/orthopnea/PND, dizziness, claudication, or dependent edema.   Hyperlipidemia is controlled with diet & meds. Last Cholesterol was  251, Triglycerides were  123, HDL 104 and LDL 122 - with parameters not at goal due to statin intolerance and decision not to pursue aggressive treatments due to declining mental faculties. Patient denies myalgias or other med SE's.    Regarding patient's Dementia, she is still independent in personal hygiene and control of bodily functions, but does require moderate supervision in maintaining ADL's. She was intolerant to Aricept with increased confusion and agitation. Last year she was started on Seroquel because of some Paranoid ideations and seemed to improve   Also, the patient has history of T2 NIDDM since 2001 and initiation of treatment in 2012. With Metformin. Last A1c was 6.3% in October and family reports random glucoses are usually less than 150 mg %. Patient denies any symptoms of reactive hypoglycemia, diabetic polys, paresthesias or visual blurring.   Further, Patient has history of Vitamin D Deficiency of 32 in 2008 with last vitamin D of 87 in Oct. Patient supplements vitamin D without any suspected side-effects.  Medication Sig Dispense Refill  . aspirin 81 MG EC tablet Take 81 mg by mouth daily. Takes on mon, wed and fri, opposite her Plavix on tu th and sat      . benazepril-hydrochlorthiazide (LOTENSIN HCT) 20-12.5 MG per tablet Take 1 tablet by mouth daily.      Marland Kitchen buPROPion (WELLBUTRIN XL) 150 MG 24 hr tablet take 1 tablet by mouth every morning  30 tablet   2  . cholecalciferol (VITAMIN D) 1000 UNITS tablet Take 2,000 Units by mouth 2 (two) times daily.       . citalopram (CELEXA) 40 MG tablet Take 20 mg by mouth daily. Take 1/2 tablet by mouth once daily       . clopidogrel (PLAVIX) 75 MG tablet take 1 tablet by mouth once daily  30 tablet  PRN  . Flaxseed, Linseed, (FLAXSEED OIL) 1000 MG CAPS Take 1,000 mg by mouth 2 (two) times daily.       Marland Kitchen levothyroxine (LEVOXYL) 75 MCG tablet Take 75 mcg by mouth daily.        . Magnesium Oxide 400 (241.3 MG) MG TABS Take by mouth 3 (three) times daily.        . Multiple Vitamins-Minerals (CENTRUM SILVER PO) Take by mouth daily.        . predniSONE (DELTASONE) 5 MG tablet Take 5 mg by mouth daily with breakfast.      . QUEtiapine (SEROQUEL) 50 MG tablet Take 50 mg by mouth at bedtime.      . verapamil (CALAN-SR) 120 MG CR tablet Take 60 mg by mouth 2 (two) times daily.           Allergies  Allergen Reactions  . Iron     GI  . Meloxicam Other (See Comments)    Abd pain  . Meperidine Hcl Other (See Comments)    agitated  . Pravastatin     myalgias  . Zolpidem Tartrate Other (See Comments)  confusion  . Codeine Palpitations    "heart raced"    PMHx:   Past Medical History  Diagnosis Date  . Asthma   . Type II or unspecified type diabetes mellitus without mention of complication, not stated as uncontrolled   . Hyperlipidemia   . Chronic headaches   . Hypertension   . Pulmonary fibrosis     Chronic granulomatous changes on CT chest, minimal mediastinal LAN 3/09  . COPD (chronic obstructive pulmonary disease)   . Vitamin D deficiency   . B12 deficiency   . Allergy   . Dementia, Alzheimer-type   . Depression   . Hypothyroidism   . CKD (chronic kidney disease) stage 3, GFR 30-59 ml/min     FHx:    Reviewed / unchanged  SHx:    Reviewed / unchanged  Systems Review: Constitutional: Denies fever, chills, wt changes, headaches, insomnia, fatigue, night sweats, change in  appetite. Eyes: Denies redness, blurred vision, diplopia, discharge, itchy, watery eyes.  ENT: Denies discharge, congestion, post nasal drip, epistaxis, sore throat, earache, hearing loss, dental pain, tinnitus, vertigo, sinus pain, snoring.  CV: Denies chest pain, palpitations, irregular heartbeat, syncope, dyspnea, diaphoresis, orthopnea, PND, claudication, edema. Respiratory: denies cough, dyspnea, DOE, pleurisy, hoarseness, laryngitis, wheezing.  Gastrointestinal: Denies dysphagia, odynophagia, heartburn, reflux, water brash, abdominal pain or cramps, nausea, vomiting, bloating, diarrhea, constipation, hematemesis, melena, hematochezia,  or hemorrhoids. Genitourinary: Denies dysuria, frequency, urgency, nocturia, hesitancy, discharge, hematuria, flank pain. Musculoskeletal: Denies arthralgias, myalgias, stiffness, jt. swelling, pain, limp, strain/sprain.  Skin: Denies pruritus, rash, hives, warts, acne, eczema, change in skin lesion(s). Neuro: No weakness, tremor, incoordination, spasms, paresthesia, or pain. Psychiatric: Denies confusion, memory loss, or sensory loss. Endo: Denies change in weight, skin, hair change.  Heme/Lymph: No excessive bleeding, bruising, orenlarged lymph nodes.  BP: 124/56  Pulse: 84  Temp: 98.1 F (36.7 C)  Resp: 18    Estimated body mass index is 23.71 kg/(m^2) as calculated from the following:   Height as of 05/04/10: 5\' 6"  (1.676 m).   Weight as of this encounter: 146 lb 12.8 oz (66.588 kg).  On Exam: Appears well nourished - in no distress. Eyes: PERRLA, EOMs, conjunctiva no swelling or erythema. Sinuses: No frontal/maxillary tenderness ENT/Mouth: EAC's clear, TM's nl w/o erythema, bulging. Nares clear w/o erythema, swelling, exudates. Oropharynx clear without erythema or exudates. Oral hygiene is good. Tongue normal, non obstructing. Hearing intact.  Neck: Supple. Thyroid nl. Car 2+/2+ without bruits, nodes or JVD. Chest: Respirations nl with BS clear  & equal w/o rales, rhonchi, wheezing or stridor.  Cor: Heart sounds normal w/ regular rate and rhythm without sig. murmurs, gallops, clicks, or rubs. Peripheral pulses normal and equal  without edema.  Abdomen: Soft & bowel sounds normal. Non-tender w/o guarding, rebound, hernias, masses, or organomegaly.  Lymphatics: Unremarkable.  Musculoskeletal: Full ROM all peripheral extremities, joint stability, 5/5 strength, and normal gait.  Skin: Warm, dry without exposed rashes, lesions, ecchymosis apparent.  Neuro: Cranial nerves intact, reflexes equal bilaterally. Sensory-motor testing grossly intact. Tendon reflexes grossly intact.  Pysch: Alert & oriented x 3. Insight and judgement nl & appropriate. No ideations.  Assessment and Plan:  1. Hypertension - Continue monitor blood pressure at home. Continue diet/meds same.  2. Hyperlipidemia - Continue diet/meds, exercise,& lifestyle modifications. Continue monitor periodic cholesterol/liver & renal functions   3. T2 NIDDM - continue recommend prudent low glycemic diet, weight control, regular exercise, diabetic monitoring and periodic eye exams.  4. Vitamin D Deficiency - Continue supplementation.  5. SDAT -   Recommended regular exercise, BP monitoring, weight control, and discussed med and SE's. Recommended labs to assess and monitor clinical status. Further disposition pending results of labs.

## 2013-02-05 LAB — HEMOGLOBIN A1C
Hgb A1c MFr Bld: 6 % — ABNORMAL HIGH (ref <5.7)
Mean Plasma Glucose: 126 mg/dL — ABNORMAL HIGH (ref <117)

## 2013-02-05 LAB — BASIC METABOLIC PANEL WITH GFR
BUN: 24 mg/dL — ABNORMAL HIGH (ref 6–23)
CHLORIDE: 102 meq/L (ref 96–112)
CO2: 31 meq/L (ref 19–32)
Calcium: 10.1 mg/dL (ref 8.4–10.5)
Creat: 0.99 mg/dL (ref 0.50–1.10)
GFR, Est African American: 62 mL/min
GFR, Est Non African American: 54 mL/min — ABNORMAL LOW
GLUCOSE: 103 mg/dL — AB (ref 70–99)
Potassium: 4.9 mEq/L (ref 3.5–5.3)
SODIUM: 142 meq/L (ref 135–145)

## 2013-02-05 LAB — LIPID PANEL
CHOL/HDL RATIO: 2.6 ratio
Cholesterol: 234 mg/dL — ABNORMAL HIGH (ref 0–200)
HDL: 90 mg/dL (ref >39)
LDL CALC: 123 mg/dL — AB (ref 0–99)
Triglycerides: 105 mg/dL (ref <150)
VLDL: 21 mg/dL (ref 0–40)

## 2013-02-05 LAB — INSULIN, FASTING: Insulin fasting, serum: 20 u[IU]/mL (ref 3–28)

## 2013-02-05 LAB — HEPATIC FUNCTION PANEL
ALK PHOS: 59 U/L (ref 39–117)
ALT: 13 U/L (ref 0–35)
AST: 17 U/L (ref 0–37)
Albumin: 4 g/dL (ref 3.5–5.2)
BILIRUBIN DIRECT: 0.1 mg/dL (ref 0.0–0.3)
BILIRUBIN TOTAL: 0.3 mg/dL (ref 0.3–1.2)
Indirect Bilirubin: 0.2 mg/dL (ref 0.0–0.9)
Total Protein: 6.6 g/dL (ref 6.0–8.3)

## 2013-02-05 LAB — IRON AND TIBC
%SAT: 9 % — AB (ref 20–55)
Iron: 27 ug/dL — ABNORMAL LOW (ref 42–145)
TIBC: 296 ug/dL (ref 250–470)
UIBC: 269 ug/dL (ref 125–400)

## 2013-02-05 LAB — VITAMIN B12: Vitamin B-12: 581 pg/mL (ref 211–911)

## 2013-02-05 LAB — VITAMIN D 25 HYDROXY (VIT D DEFICIENCY, FRACTURES): Vit D, 25-Hydroxy: 81 ng/mL (ref 30–89)

## 2013-02-05 LAB — TSH: TSH: 0.529 u[IU]/mL (ref 0.350–4.500)

## 2013-02-05 LAB — MAGNESIUM: Magnesium: 2.1 mg/dL (ref 1.5–2.5)

## 2013-02-24 ENCOUNTER — Other Ambulatory Visit: Payer: Self-pay | Admitting: Internal Medicine

## 2013-02-24 DIAGNOSIS — J45909 Unspecified asthma, uncomplicated: Secondary | ICD-10-CM

## 2013-02-24 MED ORDER — METFORMIN HCL ER 500 MG PO TB24: 500.0000 mg | ORAL_TABLET | Freq: Every day | ORAL | Status: DC

## 2013-02-24 MED ORDER — PREDNISONE 5 MG PO TABS: ORAL_TABLET | ORAL | Status: DC

## 2013-04-18 ENCOUNTER — Other Ambulatory Visit: Payer: Self-pay | Admitting: Internal Medicine

## 2013-05-07 ENCOUNTER — Ambulatory Visit: Payer: Medicare Other | Admitting: Emergency Medicine

## 2013-05-21 ENCOUNTER — Other Ambulatory Visit: Payer: Self-pay | Admitting: *Deleted

## 2013-05-21 MED ORDER — BENAZEPRIL-HYDROCHLOROTHIAZIDE 20-12.5 MG PO TABS: 1.0000 | ORAL_TABLET | Freq: Every day | ORAL | Status: DC

## 2013-05-22 ENCOUNTER — Ambulatory Visit (INDEPENDENT_AMBULATORY_CARE_PROVIDER_SITE_OTHER): Payer: Medicare Other | Admitting: Internal Medicine

## 2013-05-22 ENCOUNTER — Encounter: Payer: Self-pay | Admitting: Internal Medicine

## 2013-05-22 VITALS — BP 136/60 | HR 80 | Temp 96.4°F | Resp 16 | Ht 66.0 in | Wt 145.8 lb

## 2013-05-22 DIAGNOSIS — I10 Essential (primary) hypertension: Secondary | ICD-10-CM

## 2013-05-22 DIAGNOSIS — F09 Unspecified mental disorder due to known physiological condition: Secondary | ICD-10-CM

## 2013-05-22 DIAGNOSIS — C44621 Squamous cell carcinoma of skin of unspecified upper limb, including shoulder: Secondary | ICD-10-CM

## 2013-05-22 DIAGNOSIS — F079 Unspecified personality and behavioral disorder due to known physiological condition: Secondary | ICD-10-CM

## 2013-05-22 DIAGNOSIS — D485 Neoplasm of uncertain behavior of skin: Secondary | ICD-10-CM

## 2013-05-22 MED ORDER — QUETIAPINE FUMARATE 50 MG PO TABS: ORAL_TABLET | ORAL | Status: DC

## 2013-05-22 NOTE — Progress Notes (Signed)
Subjective:    Patient ID: [name] [name], female    DOB: 02-05-1929, 80 y.o.   MRN: [id number]  HPI  Very nice 80 yo WWF with HTN, HLD, T2 DM, Hypothyroidism and OBS/SDAT who also is being titrated with Seroquel to manage progressive mental deterioration with delusions and hallucinations. Family is doing a great job rotating and alternating shift to provide near round the clock assistance and monitoring. Patient is still competent in ADL's, but has very limited ST recall and has been observed to to delusional talking to imaginary groups of people for hours and hours. She also never really "shuts down" to sleep , but continuously talks gibberish all night long only to doze off frequently during the day.  For several days her Hs Seroquel has been  complimented with a 1/2 dose in the morning, but this morning she seems sedated. Also in the recent pat she has been observed to be figidity with ?RLS type Sx's after her Wellbutrin.  Also, patient presents for evaluation and removal of a lesion of her dorsal Rt hand which has been treatedin the past with Liq Nitrogen Cryotherapy  Current Outpatient Prescriptions on File Prior to Visit  Medication Sig Dispense Refill  . aspirin 81 MG EC tablet Take 81 mg by mouth daily. Takes on mon, wed and fri, opposite her Plavix on tu th and sat      . benazepril-hydrochlorthiazide (LOTENSIN HCT) 20-12.5 MG per tablet Take 1 tablet by mouth daily.  90 tablet  3  . cholecalciferol (VITAMIN D) 1000 UNITS tablet Take 2,000 Units by mouth 2 (two) times daily.       . citalopram (CELEXA) 40 MG tablet Take 20 mg by mouth daily. Take 1/2 tablet by mouth once daily       . clopidogrel (PLAVIX) 75 MG tablet take 1 tablet by mouth once daily  30 tablet  PRN  . Flaxseed, Linseed, (FLAXSEED OIL) 1000 MG CAPS Take 1,000 mg by mouth 2 (two) times daily.       Marland Kitchen levothyroxine (LEVOXYL) 75 MCG tablet Take 75 mcg by mouth daily.        Marland Kitchen loratadine (CLARITIN) 10 MG tablet Take 10 mg by  mouth daily.      . Magnesium Oxide 400 (241.3 MG) MG TABS Take by mouth 3 (three) times daily.        . metFORMIN (GLUCOPHAGE-XR) 500 MG 24 hr tablet Take 1 tablet (500 mg total) by mouth daily with breakfast. Take 1-2 tabs by mouth daily with breakfast  360 tablet  3  . Multiple Vitamins-Minerals (CENTRUM SILVER PO) Take by mouth daily.        . predniSONE (DELTASONE) 5 MG tablet 1 tablet daily or as directed  100 tablet  3  . Probiotic Product (RESTORA PO) Take by mouth. Takes im am       No current facility-administered medications on file prior to visit.   Allergies  Allergen Reactions  . Iron     GI  . Meloxicam Other (See Comments)    Abd pain  . Meperidine Hcl Other (See Comments)    agitated  . Pravastatin     myalgias  . Zolpidem Tartrate Other (See Comments)    confusion  . Codeine Palpitations    "heart raced"   Past Medical History  Diagnosis Date  . Asthma   . Type II or unspecified type diabetes mellitus without mention of complication, not stated as uncontrolled   . Hyperlipidemia   .  Chronic headaches   . Hypertension   . Pulmonary fibrosis     Chronic granulomatous changes on CT chest, minimal mediastinal LAN 3/09  . COPD (chronic obstructive pulmonary disease)   . Vitamin D deficiency   . B12 deficiency   . Allergy   . Dementia   . Depression   . Hypothyroidism   . CKD (chronic kidney disease) stage 3, GFR 30-59 ml/min    Review of Systems  In addition to the HPI above,  No Fever-chills,  No Headache, No changes with Vision or hearing,  No problems swallowing food or Liquids,  No Chest pain or productive Cough or Shortness of Breath,  No Abdominal pain, No Nausea or Vommitting, Bowel movements are regular,  No Blood in stool or Urine,  No dysuria,  No new skin rashes or bruises,  No new joints pains-aches,  No new weakness, tingling, numbness in any extremity,  No recent weight loss,  No polyuria, polydypsia or polyphagia,  No significant  Mental Stressors.  A full 10 point Review of Systems was done, except as stated above, all other Review of Systems were negative  Objective:   Physical Exam  BP 136/60  Pulse 80  Temp(Src) 96.4 F (35.8 C) (Temporal)  Resp 16  Ht 5\' 6"  (1.676 m)  Wt 145 lb 12.8 oz (66.134 kg)  BMI 23.54 kg/m2  HEENT - Eac's patent. TM's Nl.EOM's full. PERRLA. NasoOroPharynx clear. Neck - supple. Nl Thyroid. No bruits nodes JVD Chest - Clear equal BS Cor - Nl HS. RRR w/o sig MGR. PP 1(+) No edema. Abd - No palpable organomegaly, masses or tenderness. BS nl. MS- FROM. w/o deformities. Muscle power tone and bulk Nl. Gait Nl. Neuro - No obvious Cr N abnormalities. Sensory, motor and Cerebellar functions appear Nl w/o focal abnormalities. St recall is very limited.  Assessment & Plan:   1. HYPERTENSION  2. OBS - D/C Wellbutrin - Change Seroquel 50 mg x 1&1/2 tab (=75 mg) qhs.   3. Neoplasm of uncertain behavior of skin - Dermatology pathology  ========================================================================== After informed consent , aseptic prep with Isopropyl alcohol  and local anesthesia with Marcaine 0.5% /Epi 1.0% x 4cc in and around a scarred lesion with central ulceration and dry eschar on the dorsal Rt hand located  between the Rt index and long finger metacarpal head area. Diameter of raised lesion was sharply and gently excavated under magnification with a # 10 blade fully thru the dermis to the sub- cut and carefully dissected to avoid any vascular, nerve or tendon structures. Thereafter the wound edges were undermined and approximated and secured with 4-0 nylon by # 6 locking sutures with wound length of 1 &1/4 ". Hemostasis was contained with pressure for 10 minutes and then the wound was painted with "New Skin" followed with a 2" x 3" Tegaderm Dsg and bound with a 2" elastic wrap. Wound care was discussed with Daughter-in-Law.To return 10-12 days for suture removal.

## 2013-05-22 NOTE — Patient Instructions (Addendum)
   Keep wound covered with Tegaderm and   monitor hand daily for signs of infection

## 2013-06-05 ENCOUNTER — Encounter: Payer: Self-pay | Admitting: Internal Medicine

## 2013-06-05 ENCOUNTER — Ambulatory Visit: Payer: Medicare Other | Admitting: Internal Medicine

## 2013-06-05 DIAGNOSIS — C44622 Squamous cell carcinoma of skin of right upper limb, including shoulder: Secondary | ICD-10-CM

## 2013-06-05 NOTE — Progress Notes (Signed)
   Subjective:    Patient ID: [name] [name], female    DOB: [birth year], 80 y.o.   MRN: [id number]  HPI P:atient returns for F/U and suture removal after excision of skin cancer from dorsum of right hand. Path report showed "free" margins.  Review of Systems Objective:   Physical Exam  Focused exam of right hand shows normal sensory- motor functions and wound site is well healed with no signs of infection. All sutures are removed.   Assessment & Plan:   1. 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

## 2013-06-05 NOTE — Progress Notes (Signed)
Patient ID: [name] [name], female   DOB: January 29, 1930, 80 y.o.   MRN: [id number]   Path report of lesion excised from dorsum of right hand returned  showing a squamous cell carcinoma with free margins. Insurance will be re-filed based on the new information.

## 2013-06-25 ENCOUNTER — Ambulatory Visit (HOSPITAL_COMMUNITY)
Admission: RE | Admit: 2013-06-25 | Discharge: 2013-06-25 | Disposition: A | Discharge status: Home / Self Care | Payer: Medicare Other | Source: Ambulatory Visit | Attending: Internal Medicine | Admitting: Internal Medicine

## 2013-06-25 ENCOUNTER — Ambulatory Visit (INDEPENDENT_AMBULATORY_CARE_PROVIDER_SITE_OTHER): Payer: Medicare Other | Admitting: Internal Medicine

## 2013-06-25 ENCOUNTER — Other Ambulatory Visit: Payer: Self-pay | Admitting: *Deleted

## 2013-06-25 ENCOUNTER — Encounter: Payer: Self-pay | Admitting: Internal Medicine

## 2013-06-25 VITALS — BP 108/56 | HR 76 | Temp 97.7°F | Resp 16 | Ht 66.0 in | Wt 145.8 lb

## 2013-06-25 DIAGNOSIS — I517 Cardiomegaly: Secondary | ICD-10-CM | POA: Insufficient documentation

## 2013-06-25 DIAGNOSIS — Z0000 Encounter for general adult medical examination without abnormal findings: Secondary | ICD-10-CM

## 2013-06-25 DIAGNOSIS — E559 Vitamin D deficiency, unspecified: Secondary | ICD-10-CM

## 2013-06-25 DIAGNOSIS — Z1212 Encounter for screening for malignant neoplasm of rectum: Secondary | ICD-10-CM

## 2013-06-25 DIAGNOSIS — R079 Chest pain, unspecified: Secondary | ICD-10-CM

## 2013-06-25 DIAGNOSIS — E785 Hyperlipidemia, unspecified: Secondary | ICD-10-CM

## 2013-06-25 DIAGNOSIS — Z789 Other specified health status: Secondary | ICD-10-CM

## 2013-06-25 DIAGNOSIS — J449 Chronic obstructive pulmonary disease, unspecified: Secondary | ICD-10-CM | POA: Insufficient documentation

## 2013-06-25 DIAGNOSIS — Z79899 Other long term (current) drug therapy: Secondary | ICD-10-CM | POA: Insufficient documentation

## 2013-06-25 DIAGNOSIS — E1129 Type 2 diabetes mellitus with other diabetic kidney complication: Secondary | ICD-10-CM

## 2013-06-25 DIAGNOSIS — J4489 Other specified chronic obstructive pulmonary disease: Secondary | ICD-10-CM | POA: Insufficient documentation

## 2013-06-25 DIAGNOSIS — I10 Essential (primary) hypertension: Secondary | ICD-10-CM

## 2013-06-25 DIAGNOSIS — Z1331 Encounter for screening for depression: Secondary | ICD-10-CM

## 2013-06-25 LAB — CBC WITH DIFFERENTIAL/PLATELET
BASOS ABS: 0 10*3/uL (ref 0.0–0.1)
BASOS PCT: 0 % (ref 0–1)
Eosinophils Absolute: 0.2 10*3/uL (ref 0.0–0.7)
Eosinophils Relative: 2 % (ref 0–5)
HEMATOCRIT: 30.7 % — AB (ref 36.0–46.0)
HEMOGLOBIN: 10.4 g/dL — AB (ref 12.0–15.0)
Lymphocytes Relative: 10 % — ABNORMAL LOW (ref 12–46)
Lymphs Abs: 0.9 10*3/uL (ref 0.7–4.0)
MCH: 30.1 pg (ref 26.0–34.0)
MCHC: 33.9 g/dL (ref 30.0–36.0)
MCV: 89 fL (ref 78.0–100.0)
MONO ABS: 0.7 10*3/uL (ref 0.1–1.0)
Monocytes Relative: 8 % (ref 3–12)
NEUTROS ABS: 7.4 10*3/uL (ref 1.7–7.7)
Neutrophils Relative %: 80 % — ABNORMAL HIGH (ref 43–77)
Platelets: 326 10*3/uL (ref 150–400)
RBC: 3.45 MIL/uL — ABNORMAL LOW (ref 3.87–5.11)
RDW: 12.8 % (ref 11.5–15.5)
WBC: 9.2 10*3/uL (ref 4.0–10.5)

## 2013-06-25 LAB — HEMOGLOBIN A1C
Hgb A1c MFr Bld: 6.4 % — ABNORMAL HIGH (ref <5.7)
Mean Plasma Glucose: 137 mg/dL — ABNORMAL HIGH (ref <117)

## 2013-06-25 MED ORDER — LEVOFLOXACIN 500 MG PO TABS: 500.0000 mg | ORAL_TABLET | Freq: Every day | ORAL | Status: AC

## 2013-06-25 NOTE — Patient Instructions (Signed)

## 2013-06-25 NOTE — Progress Notes (Signed)
Patient ID: [name] [name], female   DOB: Sep 04, 1929, 80 y.o.   MRN: [id number]   Annual Screening Comprehensive Examination  This very nice 80 y.o.WWF presents for complete physical.  Patient has been followed for HTN, T2_NIDDM, Hyperlipidemia, and Vitamin D Deficiency.Patient also has SDAT and require close supervision by multiple family members for supervision. She has been observed delusional with hallucinations at times, but has improved since on Seroquel. Also family relates she has had a cough for several days and becoming less alert and with decreased po intake after a recent family trip with exposure to a child with a respiratory infection.    HTN predates since 36. Patient's BP has been controlled at home. Today's BP: 108/56 mmHg. Patient denies any cardiac symptoms as chest pain, palpitations, shortness of breath, dizziness or ankle swelling.   Patient's hyperlipidemia is not fully controlled with diet and not that patient is intolerant/allergisc to statins. Patient denies myalgias or other medication SE's. Last lipids as below in Jan 2015.  Lab Results  Component Value Date   CHOL 234* 02/04/2013   HDL 90 02/04/2013   LDLCALC 123* 02/04/2013   TRIG 105 02/04/2013   CHOLHDL 2.6 02/04/2013    Patient has T2 NIDDM initially Dx'd in 2001 and managed with diet until started on Metformin in 2012 and last A1c was 6.0% in Jan 2015. Patient denies reactive hypoglycemic symptoms, visual blurring, diabetic polys, or paresthesias.   Finally, patient has history of Vitamin D Deficiency of 32 in 2008and last vitamin D was 81 in Jan 2015. Medication Sig  . aspirin 81 MG EC tablet Take 81 mg by mouth daily. Takes on mon, wed and fri, opposite her Plavix on tu th and sat  . benazepril-hydrochlorthiazide 20-12.5  Take 1 tablet by mouth daily.  Marland Kitchen VITAMIN D 1000 UNITS tablet Take 2,000 Units by mouth 2 (two) times daily.   . citalopram (CELEXA) 40 MG tab Take 20 mg by mouth daily. Take 1/2 tablet by mouth  once daily   . clopidogrel (PLAVIX) 75 MG tab take 1 tablet by mouth once daily  . FLAXSEED OIL 1000 MG CAPS Take 1,000 mg by mouth 2 (two) times daily.   Marland Kitchen levothyroxine 75 MCG Take 75 mcg by mouth daily.    Marland Kitchen loratadine (CLARITIN) 10 MG tablet Take 10 mg by mouth daily.  . Magnesium Oxide 400  Take by mouth 3 (three) times daily.    . metFORMIN (GLUCOPHAGE-XR) 500  Take 1-2 tabs by mouth daily with breakfast  . CENTRUM SILVER PO) Take by mouth daily.    . predniSONE  5 MG tablet 1 tablet daily or as directed  . RESTORA Take by mouth. Takes im am  . QUEtiapine (SEROQUEL) 50 MG tablet Take 1 and 1/2 tablets 1 hour before bedtime  . verapamil  240 MG (CO) 24 hr tablet Take 240 mg by mouth at bedtime.   Allergies  Allergen Reactions  . Iron     GI  . Meloxicam Other (See Comments)    Abd pain  . Meperidine Hcl Other (See Comments)    agitated  . Pravastatin     myalgias  . Zolpidem Tartrate Other (See Comments)    confusion  . Codeine Palpitations    "heart raced"   Past Medical History  Diagnosis Date  . Asthma   . Type II or unspecified type diabetes mellitus without mention of complication, not stated as uncontrolled   . Hyperlipidemia   . Chronic headaches   .  Hypertension   . Pulmonary fibrosis     Chronic granulomatous changes on CT chest, minimal mediastinal LAN 3/09  . COPD (chronic obstructive pulmonary disease)   . Vitamin D deficiency   . B12 deficiency   . Allergy   . Dementia   . Depression   . Hypothyroidism   . CKD (chronic kidney disease) stage 3, GFR 30-59 ml/min    Past Surgical History  Procedure Laterality Date  . Appendectomy    . Thyroidectomy    . Tubal ligation    . Esophagogastroduodenoscopy  2012    Gastritis  . Breast biopsy      right breast/benign  . Endometrial biopsy  1998    negative   Family History  Problem Relation Age of Onset  . Asthma Sister   . Cancer Sister     breast, colon  . Diabetes Mother   . Cancer Mother      breast  . Diabetes Sister   . Cancer Sister 37    breast  . Heart disease Father   . Hypertension Father   . Cancer Brother     leukemia  . Cancer Brother     lung  . Hypertension Brother   . Hyperlipidemia Brother    History  Substance Use Topics  . Smoking status: Never Smoker   . Smokeless tobacco: Never Used  . Alcohol Use: No    ROS Constitutional: Denies fever, chills, weight loss/gain, headaches, insomnia, fatigue, night sweats, and change in appetite. Eyes: Denies redness, blurred vision, diplopia, discharge, itchy, watery eyes.  ENT: Denies discharge, congestion, post nasal drip, epistaxis, sore throat, earache, hearing loss, dental pain, Tinnitus, Vertigo, Sinus pain, snoring.  Cardio: Denies chest pain, palpitations, irregular heartbeat, syncope, dyspnea, diaphoresis, orthopnea, PND, claudication, edema Respiratory: denies cough, dyspnea, DOE, pleurisy, hoarseness, laryngitis, wheezing.  Gastrointestinal: Denies dysphagia, heartburn, reflux, water brash, pain, cramps, nausea, vomiting, bloating, diarrhea, constipation, hematemesis, melena, hematochezia, jaundice, hemorrhoids Genitourinary: Denies dysuria, frequency, urgency, nocturia, hesitancy, discharge, hematuria, flank pain Breast:Breast lumps, nipple discharge, bleeding.  Musculoskeletal: Denies arthralgia, myalgia, stiffness, Jt. Swelling, pain, limp, and strain/sprain. Skin: Denies puritis, rash, hives, warts, acne, eczema, changing in skin lesion Neuro: No weakness, tremor, incoordination, spasms, paresthesia, pain Psychiatric: Poor ST memory recall with confabulation Endocrine: Denies change in weight, skin, hair change, nocturia, and paresthesia, diabetic polys, visual blurring, hyper / hypo glycemic episodes.  Heme/Lymph: No excessive bleeding, bruising, enlarged lymph nodes.   Physical Exam  BP 108/56  P 76  T 97.7 F   Resp 16  Ht 5\' 6"   Wt 145 lb 12.8 oz BMI 23.54 kg/m2  General Appearance: Well  nourished, in no apparent distress. Eyes: PERRLA, EOMs, conjunctiva no swelling or erythema, normal fundi and vessels. Sinuses: No frontal/maxillary tenderness ENT/Mouth: EACs patent / TMs  nl. Nares clear without erythema, swelling, mucoid exudates. Oral hygiene is good. No erythema, swelling, or exudate. Tongue normal, non-obstructing. Tonsils not swollen or erythematous. Hearing normal.  Neck: Supple, thyroid normal. No bruits, nodes or JVD. Respiratory: Respiratory effort normal.  BS equal and clear bilateral without rales, rhonci, wheezing or stridor. Cardio: Heart sounds are normal with regular rate and rhythm and no murmurs, rubs or gallops. Peripheral pulses are normal and equal bilaterally without edema. No aortic or femoral bruits. Chest: symmetric with normal excursions and percussion. Breasts: Symmetric, without lumps, nipple discharge, retractions, or fibrocystic changes.  Abdomen: Flat, soft, with bowl sounds. Nontender, no guarding, rebound, hernias, masses, or organomegaly.  Lymphatics: Non tender  without lymphadenopathy.  Genitourinary:  Musculoskeletal: Full ROM all peripheral extremities, joint stability, 5/5 strength, and normal gait. Skin: Warm and dry without rashes, lesions, cyanosis, clubbing or  ecchymosis.  Neuro: Cranial nerves intact, reflexes equal bilaterally. Normal muscle tone, no cerebellar symptoms. Sensation intact. (+) snout & palmomental reflex. Pysch: Awake and oriented X 3, normal affect, Insight and Judgment appropriate.   Assessment and Plan  1. Annual Screening Examination 2. Hypertension  3. Hyperlipidemia 4. T2 NIDDM 5. Vitamin D Deficiency 6. SDAT 7. COPD  Continue prudent diet as discussed, weight control, BP monitoring, regular exercise, and medications. Discussed med's effects and SE's. Screening labs and tests as requested with regular follow-up as recommended.  [employer] - Because of cough, pt was sent for CXR which did show a RUL  pneumonia and Rx Levaquin 500 mg # 15 was called in with recommendation to recheck CXR in 3 weeks to allow for Xray lag resolution.

## 2013-06-26 LAB — URINALYSIS, MICROSCOPIC ONLY
BACTERIA UA: NONE SEEN
CRYSTALS: NONE SEEN
Casts: NONE SEEN
SQUAMOUS EPITHELIAL / LPF: NONE SEEN

## 2013-06-26 LAB — BASIC METABOLIC PANEL WITH GFR
BUN: 36 mg/dL — ABNORMAL HIGH (ref 6–23)
CHLORIDE: 102 meq/L (ref 96–112)
CO2: 24 mEq/L (ref 19–32)
Calcium: 9.1 mg/dL (ref 8.4–10.5)
Creat: 1.18 mg/dL — ABNORMAL HIGH (ref 0.50–1.10)
GFR, EST AFRICAN AMERICAN: 50 mL/min — AB
GFR, EST NON AFRICAN AMERICAN: 43 mL/min — AB
Glucose, Bld: 98 mg/dL (ref 70–99)
Potassium: 4.2 mEq/L (ref 3.5–5.3)
SODIUM: 141 meq/L (ref 135–145)

## 2013-06-26 LAB — LIPID PANEL
Cholesterol: 244 mg/dL — ABNORMAL HIGH (ref 0–200)
HDL: 80 mg/dL (ref >39)
LDL Cholesterol: 143 mg/dL — ABNORMAL HIGH (ref 0–99)
Total CHOL/HDL Ratio: 3.1 Ratio
Triglycerides: 107 mg/dL (ref <150)
VLDL: 21 mg/dL (ref 0–40)

## 2013-06-26 LAB — MICROALBUMIN / CREATININE URINE RATIO
Creatinine, Urine: 166.1 mg/dL
MICROALB/CREAT RATIO: 8.6 mg/g (ref 0.0–30.0)
Microalb, Ur: 1.43 mg/dL (ref 0.00–1.89)

## 2013-06-26 LAB — TSH: TSH: 0.403 u[IU]/mL (ref 0.350–4.500)

## 2013-06-26 LAB — HEPATIC FUNCTION PANEL
ALK PHOS: 100 U/L (ref 39–117)
ALT: 36 U/L — ABNORMAL HIGH (ref 0–35)
AST: 24 U/L (ref 0–37)
Albumin: 3.4 g/dL — ABNORMAL LOW (ref 3.5–5.2)
BILIRUBIN DIRECT: 0.1 mg/dL (ref 0.0–0.3)
BILIRUBIN INDIRECT: 0.3 mg/dL (ref 0.2–1.2)
BILIRUBIN TOTAL: 0.4 mg/dL (ref 0.2–1.2)
Total Protein: 6.5 g/dL (ref 6.0–8.3)

## 2013-06-26 LAB — VITAMIN D 25 HYDROXY (VIT D DEFICIENCY, FRACTURES): Vit D, 25-Hydroxy: 95 ng/mL — ABNORMAL HIGH (ref 30–89)

## 2013-06-26 LAB — MAGNESIUM: Magnesium: 2.1 mg/dL (ref 1.5–2.5)

## 2013-06-26 LAB — INSULIN, FASTING: Insulin fasting, serum: 11 u[IU]/mL (ref 3–28)

## 2013-06-27 ENCOUNTER — Other Ambulatory Visit: Payer: Self-pay | Admitting: Internal Medicine

## 2013-07-06 ENCOUNTER — Other Ambulatory Visit: Payer: Self-pay | Admitting: Internal Medicine

## 2013-07-06 DIAGNOSIS — F09 Unspecified mental disorder due to known physiological condition: Secondary | ICD-10-CM

## 2013-07-06 MED ORDER — QUETIAPINE FUMARATE 50 MG PO TABS: ORAL_TABLET | ORAL | Status: DC

## 2013-07-07 ENCOUNTER — Ambulatory Visit (INDEPENDENT_AMBULATORY_CARE_PROVIDER_SITE_OTHER): Payer: Medicare Other | Admitting: Internal Medicine

## 2013-07-07 ENCOUNTER — Emergency Department (HOSPITAL_COMMUNITY)
Admission: EM | Admit: 2013-07-07 | Discharge: 2013-07-07 | Disposition: A | Discharge status: Home / Self Care | Payer: Medicare Other | Attending: Emergency Medicine | Admitting: Emergency Medicine

## 2013-07-07 ENCOUNTER — Emergency Department (HOSPITAL_COMMUNITY): Payer: Medicare Other

## 2013-07-07 ENCOUNTER — Encounter: Payer: Self-pay | Admitting: Internal Medicine

## 2013-07-07 ENCOUNTER — Encounter (HOSPITAL_COMMUNITY): Payer: Self-pay | Admitting: Emergency Medicine

## 2013-07-07 VITALS — BP 110/64 | HR 80 | Temp 98.4°F | Resp 16 | Ht 66.0 in | Wt 142.6 lb

## 2013-07-07 DIAGNOSIS — I129 Hypertensive chronic kidney disease with stage 1 through stage 4 chronic kidney disease, or unspecified chronic kidney disease: Secondary | ICD-10-CM | POA: Insufficient documentation

## 2013-07-07 DIAGNOSIS — E559 Vitamin D deficiency, unspecified: Secondary | ICD-10-CM | POA: Insufficient documentation

## 2013-07-07 DIAGNOSIS — F0390 Unspecified dementia without behavioral disturbance: Secondary | ICD-10-CM | POA: Insufficient documentation

## 2013-07-07 DIAGNOSIS — IMO0001 Reserved for inherently not codable concepts without codable children: Secondary | ICD-10-CM | POA: Insufficient documentation

## 2013-07-07 DIAGNOSIS — Z7902 Long term (current) use of antithrombotics/antiplatelets: Secondary | ICD-10-CM | POA: Insufficient documentation

## 2013-07-07 DIAGNOSIS — Z7982 Long term (current) use of aspirin: Secondary | ICD-10-CM | POA: Insufficient documentation

## 2013-07-07 DIAGNOSIS — F3289 Other specified depressive episodes: Secondary | ICD-10-CM | POA: Insufficient documentation

## 2013-07-07 DIAGNOSIS — R5383 Other fatigue: Principal | ICD-10-CM

## 2013-07-07 DIAGNOSIS — F329 Major depressive disorder, single episode, unspecified: Secondary | ICD-10-CM | POA: Insufficient documentation

## 2013-07-07 DIAGNOSIS — R531 Weakness: Secondary | ICD-10-CM

## 2013-07-07 DIAGNOSIS — N183 Chronic kidney disease, stage 3 unspecified: Secondary | ICD-10-CM | POA: Insufficient documentation

## 2013-07-07 DIAGNOSIS — E119 Type 2 diabetes mellitus without complications: Secondary | ICD-10-CM | POA: Insufficient documentation

## 2013-07-07 DIAGNOSIS — R5381 Other malaise: Secondary | ICD-10-CM

## 2013-07-07 DIAGNOSIS — Z79899 Other long term (current) drug therapy: Secondary | ICD-10-CM | POA: Insufficient documentation

## 2013-07-07 DIAGNOSIS — E039 Hypothyroidism, unspecified: Secondary | ICD-10-CM | POA: Insufficient documentation

## 2013-07-07 DIAGNOSIS — J449 Chronic obstructive pulmonary disease, unspecified: Secondary | ICD-10-CM | POA: Insufficient documentation

## 2013-07-07 DIAGNOSIS — J4489 Other specified chronic obstructive pulmonary disease: Secondary | ICD-10-CM | POA: Insufficient documentation

## 2013-07-07 LAB — URINALYSIS, ROUTINE W REFLEX MICROSCOPIC
Bilirubin Urine: NEGATIVE
Glucose, UA: NEGATIVE mg/dL
HGB URINE DIPSTICK: NEGATIVE
Ketones, ur: NEGATIVE mg/dL
Leukocytes, UA: NEGATIVE
Nitrite: NEGATIVE
PROTEIN: NEGATIVE mg/dL
Specific Gravity, Urine: 1.013 (ref 1.005–1.030)
Urobilinogen, UA: 0.2 mg/dL (ref 0.0–1.0)
pH: 7 (ref 5.0–8.0)

## 2013-07-07 LAB — COMPREHENSIVE METABOLIC PANEL
ALBUMIN: 3.1 g/dL — AB (ref 3.5–5.2)
ALK PHOS: 64 U/L (ref 39–117)
ALT: 10 U/L (ref 0–35)
AST: 16 U/L (ref 0–37)
BILIRUBIN TOTAL: 0.3 mg/dL (ref 0.3–1.2)
BUN: 33 mg/dL — ABNORMAL HIGH (ref 6–23)
CO2: 27 meq/L (ref 19–32)
Calcium: 9.3 mg/dL (ref 8.4–10.5)
Chloride: 98 mEq/L (ref 96–112)
Creatinine, Ser: 1.3 mg/dL — ABNORMAL HIGH (ref 0.50–1.10)
GFR calc Af Amer: 43 mL/min — ABNORMAL LOW (ref >90)
GFR calc non Af Amer: 37 mL/min — ABNORMAL LOW (ref >90)
Glucose, Bld: 107 mg/dL — ABNORMAL HIGH (ref 70–99)
Potassium: 4.3 mEq/L (ref 3.7–5.3)
Sodium: 140 mEq/L (ref 137–147)
TOTAL PROTEIN: 6.4 g/dL (ref 6.0–8.3)

## 2013-07-07 LAB — CBC WITH DIFFERENTIAL/PLATELET
BASOS ABS: 0 10*3/uL (ref 0.0–0.1)
Basophils Relative: 0 % (ref 0–1)
EOS ABS: 0.2 10*3/uL (ref 0.0–0.7)
EOS PCT: 3 % (ref 0–5)
HEMATOCRIT: 34.7 % — AB (ref 36.0–46.0)
Hemoglobin: 11.4 g/dL — ABNORMAL LOW (ref 12.0–15.0)
LYMPHS PCT: 24 % (ref 12–46)
Lymphs Abs: 1.4 10*3/uL (ref 0.7–4.0)
MCH: 29.7 pg (ref 26.0–34.0)
MCHC: 32.9 g/dL (ref 30.0–36.0)
MCV: 90.4 fL (ref 78.0–100.0)
MONO ABS: 0.5 10*3/uL (ref 0.1–1.0)
Monocytes Relative: 8 % (ref 3–12)
Neutro Abs: 3.8 10*3/uL (ref 1.7–7.7)
Neutrophils Relative %: 65 % (ref 43–77)
Platelets: 296 10*3/uL (ref 150–400)
RBC: 3.84 MIL/uL — ABNORMAL LOW (ref 3.87–5.11)
RDW: 12.5 % (ref 11.5–15.5)
WBC: 5.9 10*3/uL (ref 4.0–10.5)

## 2013-07-07 LAB — I-STAT TROPONIN, ED: Troponin i, poc: 0 ng/mL (ref 0.00–0.08)

## 2013-07-07 LAB — I-STAT CG4 LACTIC ACID, ED: Lactic Acid, Venous: 1.58 mmol/L (ref 0.5–2.2)

## 2013-07-07 LAB — LIPASE, BLOOD: LIPASE: 29 U/L (ref 11–59)

## 2013-07-07 MED ORDER — SODIUM CHLORIDE 0.9 % IV BOLUS (SEPSIS)
1000.0000 mL | Freq: Once | INTRAVENOUS | Status: AC
  Administered 2013-07-07: 1000 mL via INTRAVENOUS

## 2013-07-07 MED ORDER — SODIUM CHLORIDE 0.9 % IV BOLUS (SEPSIS): 1000.0000 mL | Freq: Once | INTRAVENOUS | Status: DC

## 2013-07-07 NOTE — ED Notes (Signed)
Dr Ray at bedside. 

## 2013-07-07 NOTE — Progress Notes (Signed)
Subjective:    Patient ID: [name] [name], female    DOB: 08/06/1929, 80 y.o.   MRN: [id number]  HPI 80 yo WWF seen about 2 weeks ago for annual exam with Hx/o HTN, HLD, Hypothyroidism, SDAT/Depression and COPD stable several years on low dose maintenance steroids. Patient had  A dry cough, but was relatively aSx and CXR found a RUL infilterate for which pt was Tx'd with Levaquin x 15 days and completed 3 days ago. Pt's family broiught in todaywit report of progressive downhill coures over the lat 3-5 days with decreased po intake , increasing lethargy and c/o extreme fatigue or weakness in her mid back with walking very short distances.  EKG today shows [place] and O2 Sat is low at 88-89% at rest from her baseline of 94% O2 Sat.     Medication List   aspirin 81 MG EC tablet  Take 81 mg by mouth daily. Takes on mon, wed and fri, opposite her Plavix on tu th and sat     benazepril-hydrochlorthiazide 20-12.5 MG per tablet  Commonly known as:  LOTENSIN HCT  Take 1 tablet by mouth daily.     CENTRUM SILVER PO  Take by mouth daily.     cholecalciferol 1000 UNITS tablet  Commonly known as:  VITAMIN D  Take 2,000 Units by mouth 2 (two) times daily.     citalopram 40 MG tablet  Commonly known as:  CELEXA  take 1/2 to 1 tablet by mouth once daily     clopidogrel 75 MG tablet  Commonly known as:  PLAVIX  take 1 tablet by mouth once daily     Flaxseed Oil 1000 MG Caps  Take 1,000 mg by mouth 2 (two) times daily.     LEVOXYL 75 MCG tablet  Generic drug:  levothyroxine  Take 75 mcg by mouth daily.     loratadine 10 MG tablet  Commonly known as:  CLARITIN  Take 10 mg by mouth daily.     magnesium oxide 400 (241.3 MG) MG tablet  Commonly known as:  MAG-OX  Take by mouth 3 (three) times daily.     metFORMIN 500 MG 24 hr tablet  Commonly known as:  GLUCOPHAGE-XR  Take 1 tablet (500 mg total) by mouth daily with breakfast. Take 1-2 tabs by mouth daily with breakfast     predniSONE 5 MG  tablet  Commonly known as:  DELTASONE  1 tablet daily or as directed     QUEtiapine 50 MG tablet  Commonly known as:  SEROQUEL  Take 1 to 2  tablets about 1 hour before bedtime     RESTORA PO  Take by mouth. Takes im am     verapamil 240 MG (CO) 24 hr tablet  Commonly known as:  COVERA HS  Take 240 mg by mouth at bedtime.       Allergies  Allergen Reactions  . Iron     GI  . Meloxicam Other (See Comments)    Abd pain  . Meperidine Hcl Other (See Comments)    agitated  . Pravastatin     myalgias  . Zolpidem Tartrate Other (See Comments)    confusion  . Codeine Palpitations    "heart raced"   Past Medical History  Diagnosis Date  . Asthma   . Type II or unspecified type diabetes mellitus without mention of complication, not stated as uncontrolled   . Hyperlipidemia   . Chronic headaches   . Hypertension   .  Pulmonary fibrosis     Chronic granulomatous changes on CT chest, minimal mediastinal LAN 3/09  . COPD (chronic obstructive pulmonary disease)   . Vitamin D deficiency   . B12 deficiency   . Allergy   . Dementia   . Depression   . Hypothyroidism   . CKD (chronic kidney disease) stage 3, GFR 30-59 ml/min    Past Surgical History  Procedure Laterality Date  . Appendectomy    . Thyroidectomy    . Tubal ligation    . Esophagogastroduodenoscopy  2012    Gastritis  . Breast biopsy      right breast/benign  . Endometrial biopsy  1998    negative   Review of Systems Noncontributory to above  Objective:   Physical Exam  BP 110/64  P 80  T 98.4 F  Resp 16  Ht 5\' 6"    Wt 142 lb 9.6 oz BMI 23.03 kg/m2  Mentally dulled below her baseline. Pale HEENT - Eac's patent. TM's Nl.EOM's full. PERRLA. NasoOroPharynx clear/moist. Neck - supple. Nl Thyroid. No bruits nodes JVD. Chest - Distant  BS. No respiratory distress. Cor - Nl HS. RRR w/o sig MGR. PP 1(+) No edema. Abd - No palpable organomegaly, masses or tenderness. BS nl. MS- FROM. w/o deformities.  Decreased muscle power tone and bulk.  Neuro - No obvious Cr N abnormalities. Sensory, motor and Cerebellar functions appear Nl w/o focal abnormalities.  Assessment & Plan:   1. Severe Fatigue -  r/o Cardiac - Respiratory - Dehydration etc. - Recommend to family to take to ER to expedite w/u to evaluate for possible correctable causes

## 2013-07-07 NOTE — ED Provider Notes (Signed)
1630 - care from [place], [place]. Patient here with weakness, recent CAP tx with levaquin. Increased weakness, has had decreased PO. Patient alert and oriented here. Awaiting urine. Remainder of labs ok so far, son and patient ok with discharge if urine negative. Urine is negative. Instructed to f/u with Dr. Melford Aase in a few days.  1. Weakness      Osvaldo Shipper, MD 07/07/13 [id number]

## 2013-07-07 NOTE — ED Notes (Signed)
Results of lactic acid given to Kirichenko, PA-C 

## 2013-07-07 NOTE — ED Notes (Signed)
Ambulated pt in hallway. O2 saturation was 97-99% with a heart rate of 85.

## 2013-07-07 NOTE — Discharge Instructions (Signed)

## 2013-07-07 NOTE — ED Notes (Signed)
Pt. Sent in from Dr. Melford Aase,, Pt. Has been treated for CAP with Levaquin X15 days.   Pt. Has been off for 3 days and has increased weakness and back pain.  She also has increased confusion.  Pt. Is alert and oriented to self and place.   Pt. Having upper back increases with inspiration.   Pt. Skin is p/w/d,, no s/s of distress

## 2013-07-07 NOTE — ED Provider Notes (Signed)
CSN: [id number]     Arrival date & time 07/07/13  1353 History   First MD Initiated Contact with Patient 07/07/13 1410     Chief Complaint  Patient presents with  . Weakness     (Consider location/radiation/quality/duration/timing/severity/associated sxs/prior Treatment) HPI [name] [name] is a 80 y.o. female with history of asthma, COPD, pulmonary fibrosis, who presents to emergency department with complaint of generalized weakness, increased confusion, right shoulder pain. Patient states her symptoms began several weeks ago. Patient was seen by her primary care Dr. Had chest x-ray done in the office which showed right lung infiltrate. She was started on Levaquin and finish 15 day course. Pt states her cough is improved, but she continues to feel weak, no energy, no appetite. She denies fevers. No chest pain or sob. No urinary symptoms. No abdominal pain.   Past Medical History  Diagnosis Date  . Asthma   . Type II or unspecified type diabetes mellitus without mention of complication, not stated as uncontrolled   . Hyperlipidemia   . Chronic headaches   . Hypertension   . Pulmonary fibrosis     Chronic granulomatous changes on CT chest, minimal mediastinal LAN 3/09  . COPD (chronic obstructive pulmonary disease)   . Vitamin D deficiency   . B12 deficiency   . Allergy   . Dementia   . Depression   . Hypothyroidism   . CKD (chronic kidney disease) stage 3, GFR 30-59 ml/min    Past Surgical History  Procedure Laterality Date  . Appendectomy    . Thyroidectomy    . Tubal ligation    . Esophagogastroduodenoscopy  2012    Gastritis  . Breast biopsy      right breast/benign  . Endometrial biopsy  1998    negative   Family History  Problem Relation Age of Onset  . Asthma Sister   . Cancer Sister     breast, colon  . Diabetes Mother   . Cancer Mother     breast  . Diabetes Sister   . Cancer Sister 59    breast  . Heart disease Father   . Hypertension Father   . Cancer  Brother     leukemia  . Cancer Brother     lung  . Hypertension Brother   . Hyperlipidemia Brother    History  Substance Use Topics  . Smoking status: Never Smoker   . Smokeless tobacco: Never Used  . Alcohol Use: No   OB History   Grav Para Term Preterm Abortions TAB SAB Ect Mult Living                 Review of Systems  Constitutional: Positive for appetite change and fatigue. Negative for fever and chills.  Respiratory: Negative for cough, chest tightness and shortness of breath.   Cardiovascular: Negative for chest pain, palpitations and leg swelling.  Gastrointestinal: Negative for nausea, vomiting, abdominal pain and diarrhea.  Genitourinary: Negative for dysuria, flank pain and pelvic pain.  Musculoskeletal: Positive for back pain and myalgias. Negative for arthralgias, neck pain and neck stiffness.  Skin: Negative for rash.  Neurological: Positive for weakness. Negative for dizziness, light-headedness and headaches.  All other systems reviewed and are negative.     Allergies  Iron; Meloxicam; Meperidine hcl; Pravastatin; Zolpidem tartrate; and Codeine  Home Medications   Prior to Admission medications   Medication Sig Start Date End Date Taking? Authorizing Provider  aspirin 81 MG EC tablet Take 81 mg by  mouth daily. Takes on mon, wed and fri, opposite her Plavix on tu th and sat    Historical Provider, MD  benazepril-hydrochlorthiazide (LOTENSIN HCT) 20-12.5 MG per tablet Take 1 tablet by mouth daily. 05/21/13   Unk Pinto, MD  cholecalciferol (VITAMIN D) 1000 UNITS tablet Take 2,000 Units by mouth 2 (two) times daily.     Historical Provider, MD  citalopram (CELEXA) 40 MG tablet take 1/2 to 1 tablet by mouth once daily 06/27/13   Vicie Mutters, PA-C  clopidogrel (PLAVIX) 75 MG tablet take 1 tablet by mouth once daily 01/30/13   Vicie Mutters, PA-C  Flaxseed, Linseed, (FLAXSEED OIL) 1000 MG CAPS Take 1,000 mg by mouth 2 (two) times daily.     Historical  Provider, MD  levothyroxine (LEVOXYL) 75 MCG tablet Take 75 mcg by mouth daily.      Historical Provider, MD  loratadine (CLARITIN) 10 MG tablet Take 10 mg by mouth daily.    Historical Provider, MD  Magnesium Oxide 400 (241.3 MG) MG TABS Take by mouth 3 (three) times daily.      Historical Provider, MD  metFORMIN (GLUCOPHAGE-XR) 500 MG 24 hr tablet Take 1 tablet (500 mg total) by mouth daily with breakfast. Take 1-2 tabs by mouth daily with breakfast 02/24/13   Unk Pinto, MD  Multiple Vitamins-Minerals (CENTRUM SILVER PO) Take by mouth daily.      Historical Provider, MD  predniSONE (DELTASONE) 5 MG tablet 1 tablet daily or as directed 02/24/13   Unk Pinto, MD  Probiotic Product (RESTORA PO) Take by mouth. Takes im am    Historical Provider, MD  QUEtiapine (SEROQUEL) 50 MG tablet Take 1 to 2  tablets about 1 hour before bedtime 07/06/13 07/07/14  Unk Pinto, MD  verapamil (COVERA HS) 240 MG (CO) 24 hr tablet Take 240 mg by mouth at bedtime.    Historical Provider, MD   BP 122/66  Pulse 77  Temp(Src) 98.2 F (36.8 C) (Oral)  SpO2 100% Physical Exam  Nursing note and vitals reviewed. Constitutional: She is oriented to person, place, and time. She appears well-developed and well-nourished. No distress.  HENT:  Head: Normocephalic.  Oral mucosa dry  Eyes: Conjunctivae and EOM are normal. Pupils are equal, round, and reactive to light.  Neck: Neck supple.  Cardiovascular: Normal rate, regular rhythm and normal heart sounds.   Pulmonary/Chest: Effort normal and breath sounds normal. No respiratory distress. She has no wheezes. She has no rales.  Abdominal: Soft. Bowel sounds are normal. She exhibits no distension. There is no tenderness. There is no rebound.  Musculoskeletal: She exhibits no edema.  Neurological: She is alert and oriented to person, place, and time.  Skin: Skin is warm and dry.  Psychiatric: She has a normal mood and affect. Her behavior is normal.    ED Course   Procedures (including critical care time) Labs Review Labs Reviewed  URINE CULTURE  CBC WITH DIFFERENTIAL  COMPREHENSIVE METABOLIC PANEL  LIPASE, BLOOD  URINALYSIS, ROUTINE W REFLEX MICROSCOPIC  I-STAT TROPOININ, ED  I-STAT CG4 LACTIC ACID, ED    Imaging Review No results found.   EKG Interpretation   Date/Time:  Tuesday July 07 2013 14:50:12 EDT Ventricular Rate:  65 PR Interval:  161 QRS Duration: 100 QT Interval:  391 QTC Calculation: 406 R Axis:   -84 Text Interpretation:  Normal sinus rhythm Left axis deviation Although  rate has decreased since last tracing of 15 March 2007 Confirmed by Jeanell Sparrow  MD, Andee Poles [phone]) on 07/07/2013  3:39:14 PM      MDM   Final diagnoses:  None   Pt with generalized weakness, poor appetite. Recent pneumonia, finished levaquin. VS normal. Oxygen sat 100% on RA. Pt appears dry. Will get IV fluids started. Labs, cxr, ua.    4:34 PM Labs unremarkable, slightly elevated creatinine, possible from dehydration. Otherwise unremarkable labs. Pt ambulated around department, oxygen sat remained in 97-98%. CXR negative. Doubt PE diven normal VS. PT has no complaints. UA pending. Plan to hydrate, d/c home. Discussed plan with son, who agrees with plan.   Signed out with Dr. Mingo Amber at shift change.   Filed Vitals:   07/07/13 1358 07/07/13 1432 07/07/13 1550 07/07/13 1557  BP: 122/66 113/61 114/64 114/64  Pulse: 77 72 68 72  Temp: 98.2 F (36.8 C)     TempSrc: Oral     Resp:  17 13 15   SpO2: 100% 100% 99% 97%     Renold Genta, PA-C 07/07/13 1636

## 2013-07-08 ENCOUNTER — Ambulatory Visit: Payer: Self-pay | Admitting: Internal Medicine

## 2013-07-08 LAB — URINE CULTURE
CULTURE: NO GROWTH
Colony Count: NO GROWTH

## 2013-07-09 NOTE — ED Provider Notes (Signed)
  I performed a history and physical examination of CHRISTINNA SPRUNG and discussed her management with Ms Marc Morgans.  I agree with the history, physical, assessment, and plan of care, with the following exceptions: None  I was present for the following procedures: None Time Spent in Critical Care of the patient: None Time spent in discussions with the patient and family: [place], MD 07/09/13 1450

## 2013-07-23 ENCOUNTER — Other Ambulatory Visit: Payer: Self-pay | Admitting: Internal Medicine

## 2013-10-15 ENCOUNTER — Other Ambulatory Visit: Payer: Self-pay | Admitting: Internal Medicine

## 2013-11-04 ENCOUNTER — Encounter (HOSPITAL_COMMUNITY): Payer: Self-pay | Admitting: Emergency Medicine

## 2013-11-04 ENCOUNTER — Emergency Department (HOSPITAL_COMMUNITY): Payer: Medicare Other

## 2013-11-04 ENCOUNTER — Telehealth: Payer: Self-pay | Admitting: Internal Medicine

## 2013-11-04 ENCOUNTER — Inpatient Hospital Stay (HOSPITAL_COMMUNITY)
Admission: EM | Admit: 2013-11-04 | Discharge: 2013-11-06 | DRG: 313 | Disposition: A | Discharge status: Home Health | Payer: Medicare Other | Attending: Internal Medicine | Admitting: Internal Medicine

## 2013-11-04 ENCOUNTER — Ambulatory Visit: Payer: Self-pay | Admitting: Internal Medicine

## 2013-11-04 DIAGNOSIS — E559 Vitamin D deficiency, unspecified: Secondary | ICD-10-CM | POA: Diagnosis present

## 2013-11-04 DIAGNOSIS — R079 Chest pain, unspecified: Secondary | ICD-10-CM

## 2013-11-04 DIAGNOSIS — R4182 Altered mental status, unspecified: Secondary | ICD-10-CM | POA: Diagnosis not present

## 2013-11-04 DIAGNOSIS — F329 Major depressive disorder, single episode, unspecified: Secondary | ICD-10-CM | POA: Diagnosis present

## 2013-11-04 DIAGNOSIS — Z7982 Long term (current) use of aspirin: Secondary | ICD-10-CM

## 2013-11-04 DIAGNOSIS — R0609 Other forms of dyspnea: Secondary | ICD-10-CM

## 2013-11-04 DIAGNOSIS — Z7952 Long term (current) use of systemic steroids: Secondary | ICD-10-CM | POA: Diagnosis not present

## 2013-11-04 DIAGNOSIS — I10 Essential (primary) hypertension: Secondary | ICD-10-CM

## 2013-11-04 DIAGNOSIS — E876 Hypokalemia: Secondary | ICD-10-CM | POA: Diagnosis not present

## 2013-11-04 DIAGNOSIS — F0391 Unspecified dementia with behavioral disturbance: Secondary | ICD-10-CM

## 2013-11-04 DIAGNOSIS — E119 Type 2 diabetes mellitus without complications: Secondary | ICD-10-CM | POA: Diagnosis present

## 2013-11-04 DIAGNOSIS — I129 Hypertensive chronic kidney disease with stage 1 through stage 4 chronic kidney disease, or unspecified chronic kidney disease: Secondary | ICD-10-CM | POA: Diagnosis present

## 2013-11-04 DIAGNOSIS — F0390 Unspecified dementia without behavioral disturbance: Secondary | ICD-10-CM | POA: Diagnosis present

## 2013-11-04 DIAGNOSIS — J441 Chronic obstructive pulmonary disease with (acute) exacerbation: Secondary | ICD-10-CM | POA: Diagnosis not present

## 2013-11-04 DIAGNOSIS — J8410 Pulmonary fibrosis, unspecified: Secondary | ICD-10-CM | POA: Diagnosis present

## 2013-11-04 DIAGNOSIS — Z79899 Other long term (current) drug therapy: Secondary | ICD-10-CM

## 2013-11-04 DIAGNOSIS — D649 Anemia, unspecified: Secondary | ICD-10-CM | POA: Diagnosis present

## 2013-11-04 DIAGNOSIS — E538 Deficiency of other specified B group vitamins: Secondary | ICD-10-CM | POA: Diagnosis not present

## 2013-11-04 DIAGNOSIS — Z7902 Long term (current) use of antithrombotics/antiplatelets: Secondary | ICD-10-CM

## 2013-11-04 DIAGNOSIS — R0789 Other chest pain: Principal | ICD-10-CM | POA: Diagnosis present

## 2013-11-04 DIAGNOSIS — E039 Hypothyroidism, unspecified: Secondary | ICD-10-CM | POA: Diagnosis not present

## 2013-11-04 DIAGNOSIS — N183 Chronic kidney disease, stage 3 (moderate): Secondary | ICD-10-CM | POA: Diagnosis not present

## 2013-11-04 DIAGNOSIS — J449 Chronic obstructive pulmonary disease, unspecified: Secondary | ICD-10-CM | POA: Diagnosis not present

## 2013-11-04 DIAGNOSIS — E785 Hyperlipidemia, unspecified: Secondary | ICD-10-CM | POA: Diagnosis present

## 2013-11-04 DIAGNOSIS — R0602 Shortness of breath: Secondary | ICD-10-CM | POA: Diagnosis not present

## 2013-11-04 DIAGNOSIS — Z792 Long term (current) use of antibiotics: Secondary | ICD-10-CM | POA: Diagnosis not present

## 2013-11-04 DIAGNOSIS — I252 Old myocardial infarction: Secondary | ICD-10-CM | POA: Diagnosis not present

## 2013-11-04 DIAGNOSIS — I2721 Secondary pulmonary arterial hypertension: Secondary | ICD-10-CM

## 2013-11-04 LAB — CBC WITH DIFFERENTIAL/PLATELET
BASOS ABS: 0 10*3/uL (ref 0.0–0.1)
Basophils Relative: 0 % (ref 0–1)
EOS ABS: 0.1 10*3/uL (ref 0.0–0.7)
EOS PCT: 4 % (ref 0–5)
HEMATOCRIT: 34 % — AB (ref 36.0–46.0)
Hemoglobin: 11.5 g/dL — ABNORMAL LOW (ref 12.0–15.0)
LYMPHS PCT: 18 % (ref 12–46)
Lymphs Abs: 0.6 10*3/uL — ABNORMAL LOW (ref 0.7–4.0)
MCH: 30.6 pg (ref 26.0–34.0)
MCHC: 33.8 g/dL (ref 30.0–36.0)
MCV: 90.4 fL (ref 78.0–100.0)
MONO ABS: 0.3 10*3/uL (ref 0.1–1.0)
Monocytes Relative: 7 % (ref 3–12)
Neutro Abs: 2.5 10*3/uL (ref 1.7–7.7)
Neutrophils Relative %: 71 % (ref 43–77)
PLATELETS: 167 10*3/uL (ref 150–400)
RBC: 3.76 MIL/uL — ABNORMAL LOW (ref 3.87–5.11)
RDW: 12.6 % (ref 11.5–15.5)
WBC: 3.5 10*3/uL — AB (ref 4.0–10.5)

## 2013-11-04 LAB — I-STAT TROPONIN, ED: Troponin i, poc: 0.03 ng/mL (ref 0.00–0.08)

## 2013-11-04 LAB — URINALYSIS, ROUTINE W REFLEX MICROSCOPIC
Bilirubin Urine: NEGATIVE
Glucose, UA: NEGATIVE mg/dL
Hgb urine dipstick: NEGATIVE
KETONES UR: NEGATIVE mg/dL
LEUKOCYTES UA: NEGATIVE
NITRITE: NEGATIVE
PROTEIN: NEGATIVE mg/dL
Specific Gravity, Urine: 1.019 (ref 1.005–1.030)
Urobilinogen, UA: 0.2 mg/dL (ref 0.0–1.0)
pH: 5.5 (ref 5.0–8.0)

## 2013-11-04 LAB — VITAMIN B12: Vitamin B-12: 855 pg/mL (ref 211–911)

## 2013-11-04 LAB — GLUCOSE, CAPILLARY
Glucose-Capillary: 241 mg/dL — ABNORMAL HIGH (ref 70–99)
Glucose-Capillary: 240 mg/dL — ABNORMAL HIGH (ref 70–99)

## 2013-11-04 LAB — TROPONIN I: Troponin I: <0.3 ng/mL (ref <0.30)

## 2013-11-04 MED ORDER — SODIUM CHLORIDE 0.9 % IV SOLN: 250.0000 mL | INTRAVENOUS | Status: DC | PRN

## 2013-11-04 MED ORDER — POTASSIUM CHLORIDE 10 MEQ/100ML IV SOLN
10.0000 meq | INTRAVENOUS | Status: AC
  Administered 2013-11-04 (×4): 10 meq via INTRAVENOUS
  Filled 2013-11-04 (×4): qty 100

## 2013-11-04 MED ORDER — MAGNESIUM GLUCONATE 500 MG PO TABS
500.0000 mg | ORAL_TABLET | Freq: Every day | ORAL | Status: DC
  Administered 2013-11-04 – 2013-11-06 (×3): 500 mg via ORAL
  Filled 2013-11-04 (×4): qty 1

## 2013-11-04 MED ORDER — ASPIRIN 81 MG PO CHEW
324.0000 mg | CHEWABLE_TABLET | Freq: Once | ORAL | Status: AC
  Administered 2013-11-04: 324 mg via ORAL
  Filled 2013-11-04: qty 4

## 2013-11-04 MED ORDER — ASPIRIN 81 MG PO TBEC
81.0000 mg | DELAYED_RELEASE_TABLET | ORAL | Status: DC
  Administered 2013-11-04 – 2013-11-06 (×2): 81 mg via ORAL
  Filled 2013-11-04 (×2): qty 1

## 2013-11-04 MED ORDER — ACETAMINOPHEN 325 MG PO TABS
650.0000 mg | ORAL_TABLET | Freq: Four times a day (QID) | ORAL | Status: DC | PRN
  Administered 2013-11-04 – 2013-11-05 (×2): 650 mg via ORAL
  Filled 2013-11-04 (×2): qty 2

## 2013-11-04 MED ORDER — CLOPIDOGREL BISULFATE 75 MG PO TABS: 75.0000 mg | ORAL_TABLET | ORAL | Status: DC

## 2013-11-04 MED ORDER — BENAZEPRIL-HYDROCHLOROTHIAZIDE 20-12.5 MG PO TABS: 1.0000 | ORAL_TABLET | Freq: Every day | ORAL | Status: DC

## 2013-11-04 MED ORDER — INSULIN ASPART 100 UNIT/ML ~~LOC~~ SOLN
0.0000 [IU] | Freq: Every day | SUBCUTANEOUS | Status: DC
  Administered 2013-11-04: 2 [IU] via SUBCUTANEOUS

## 2013-11-04 MED ORDER — LORATADINE 10 MG PO TABS
10.0000 mg | ORAL_TABLET | Freq: Every morning | ORAL | Status: DC
  Administered 2013-11-05 – 2013-11-06 (×2): 10 mg via ORAL
  Filled 2013-11-04 (×2): qty 1

## 2013-11-04 MED ORDER — QUETIAPINE FUMARATE 50 MG PO TABS
75.0000 mg | ORAL_TABLET | Freq: Every day | ORAL | Status: DC
  Administered 2013-11-04 – 2013-11-05 (×2): 75 mg via ORAL
  Filled 2013-11-04 (×3): qty 1

## 2013-11-04 MED ORDER — VERAPAMIL HCL ER 120 MG PO TBCR
120.0000 mg | EXTENDED_RELEASE_TABLET | Freq: Every morning | ORAL | Status: DC
  Administered 2013-11-05 – 2013-11-06 (×2): 120 mg via ORAL
  Filled 2013-11-04 (×2): qty 1

## 2013-11-04 MED ORDER — PREDNISONE 5 MG PO TABS
5.0000 mg | ORAL_TABLET | Freq: Every day | ORAL | Status: DC
  Administered 2013-11-05: 5 mg via ORAL
  Filled 2013-11-04 (×2): qty 1

## 2013-11-04 MED ORDER — SODIUM CHLORIDE 0.9 % IJ SOLN
3.0000 mL | Freq: Two times a day (BID) | INTRAMUSCULAR | Status: DC
  Administered 2013-11-05 – 2013-11-06 (×3): 3 mL via INTRAVENOUS

## 2013-11-04 MED ORDER — ALBUTEROL SULFATE (2.5 MG/3ML) 0.083% IN NEBU: 2.5000 mg | INHALATION_SOLUTION | Freq: Four times a day (QID) | RESPIRATORY_TRACT | Status: DC | PRN

## 2013-11-04 MED ORDER — HEPARIN SODIUM (PORCINE) 5000 UNIT/ML IJ SOLN
5000.0000 [IU] | Freq: Three times a day (TID) | INTRAMUSCULAR | Status: DC
  Administered 2013-11-04 – 2013-11-06 (×5): 5000 [IU] via SUBCUTANEOUS
  Filled 2013-11-04 (×7): qty 1

## 2013-11-04 MED ORDER — NITROGLYCERIN 0.4 MG SL SUBL
0.4000 mg | SUBLINGUAL_TABLET | SUBLINGUAL | Status: DC | PRN
  Filled 2013-11-04: qty 1

## 2013-11-04 MED ORDER — INSULIN ASPART 100 UNIT/ML ~~LOC~~ SOLN
0.0000 [IU] | Freq: Three times a day (TID) | SUBCUTANEOUS | Status: DC
  Administered 2013-11-04: 3 [IU] via SUBCUTANEOUS
  Administered 2013-11-05: 1 [IU] via SUBCUTANEOUS
  Administered 2013-11-06: 3 [IU] via SUBCUTANEOUS
  Administered 2013-11-06: 2 [IU] via SUBCUTANEOUS

## 2013-11-04 MED ORDER — CITALOPRAM HYDROBROMIDE 20 MG PO TABS
20.0000 mg | ORAL_TABLET | Freq: Every morning | ORAL | Status: DC
  Administered 2013-11-05 – 2013-11-06 (×2): 20 mg via ORAL
  Filled 2013-11-04 (×2): qty 1

## 2013-11-04 MED ORDER — SODIUM CHLORIDE 0.9 % IJ SOLN: 3.0000 mL | INTRAMUSCULAR | Status: DC | PRN

## 2013-11-04 MED ORDER — ACETAMINOPHEN 650 MG RE SUPP: 650.0000 mg | Freq: Four times a day (QID) | RECTAL | Status: DC | PRN

## 2013-11-04 MED ORDER — LEVOTHYROXINE SODIUM 75 MCG PO TABS
75.0000 ug | ORAL_TABLET | Freq: Every day | ORAL | Status: DC
  Administered 2013-11-05 – 2013-11-06 (×2): 75 ug via ORAL
  Filled 2013-11-04 (×3): qty 1

## 2013-11-04 MED ORDER — BENAZEPRIL HCL 20 MG PO TABS
20.0000 mg | ORAL_TABLET | Freq: Every day | ORAL | Status: DC
  Administered 2013-11-04 – 2013-11-06 (×3): 20 mg via ORAL
  Filled 2013-11-04 (×3): qty 1

## 2013-11-04 MED ORDER — POTASSIUM CHLORIDE CRYS ER 20 MEQ PO TBCR
40.0000 meq | EXTENDED_RELEASE_TABLET | Freq: Once | ORAL | Status: AC
  Administered 2013-11-04: 40 meq via ORAL
  Filled 2013-11-04: qty 2

## 2013-11-04 MED ORDER — POTASSIUM CHLORIDE 10 MEQ/100ML IV SOLN
10.0000 meq | Freq: Once | INTRAVENOUS | Status: AC
  Administered 2013-11-04: 10 meq via INTRAVENOUS
  Filled 2013-11-04: qty 100

## 2013-11-04 MED ORDER — HYDROCHLOROTHIAZIDE 12.5 MG PO CAPS
12.5000 mg | ORAL_CAPSULE | Freq: Every day | ORAL | Status: DC
  Administered 2013-11-04 – 2013-11-06 (×3): 12.5 mg via ORAL
  Filled 2013-11-04 (×3): qty 1

## 2013-11-04 MED ORDER — VITAMIN C 500 MG PO TABS
1000.0000 mg | ORAL_TABLET | Freq: Every day | ORAL | Status: DC
  Administered 2013-11-05 – 2013-11-06 (×2): 1000 mg via ORAL
  Filled 2013-11-04 (×2): qty 2

## 2013-11-04 NOTE — H&P (Addendum)
[facility] History and Physical  [name] [name] EQA:[id number] DOB: [birth year] DOA: 11/04/2013   PCP: Alesia Richards, MD    Chief Complaint: chest pain and dyspnea  HPI: [name] [name] is a 80 y.o. female with PMH of COPD/ fibrosis, DM 2, HTN, dementia, hypothyroidism and CKD brought into the hospital by EMS for dyspnea. Family in the room was not present during the episode and due to dementia, the patient is not a good historian. Apparently she was short of breath and had chest pain. It is not certain if she was hypoxic but EMS reportedly gave her a neb treatment. In the ER, by the time she was seen by the EDP, her dyspnea and chest pain had resolved. EKG changes were noted, however with ST depressions which resolved on a repeat EKG. She currently is asymptomatic.   Her family also states that she had dementia and mild confusion at baseline which has been worse over the past few days with visual and auditory hallucinations. She has been talking to people that are not there. No significant agitation noted. She has hallucinated in the past when she has a UTI.   The patient also has had a congested cough and wheezing lately. She has chronic dyspnea with mild/moderate ambulation and has COPD from second hand smoke.    General: The patient denies anorexia, fever, weight loss Cardiac: Denies chest pain, syncope, palpitations, pedal edema  Respiratory: cough +, + shortness of breath, +wheezing GI: Denies severe indigestion/heartburn, abdominal pain, nausea, vomiting, diarrhea and constipation- h/o IBS- but not lately GU: Denies hematuria, incontinence, dysuria  Musculoskeletal: + arthritis  Skin: Denies suspicious skin lesions Neurologic: left leg and foot with pain and numbness and mild weakness due to neuropathy-  change in vision  Past Medical History  Diagnosis Date  . Asthma   . Type II or unspecified type diabetes mellitus without mention of complication, not stated as  uncontrolled   . Hyperlipidemia   . Chronic headaches   . Hypertension   . Pulmonary fibrosis     Chronic granulomatous changes on CT chest, minimal mediastinal LAN 3/09  . COPD (chronic obstructive pulmonary disease)   . Vitamin D deficiency   . B12 deficiency   . Allergy   . Dementia   . Depression   . Hypothyroidism   . CKD (chronic kidney disease) stage 3, GFR 30-59 ml/min     Past Surgical History  Procedure Laterality Date  . Appendectomy    . Thyroidectomy    . Tubal ligation    . Esophagogastroduodenoscopy  2012    Gastritis  . Breast biopsy      right breast/benign  . Endometrial biopsy  1998    negative    Social History: no h/o smoking or ETOH abuse Lives at home- has family and caretakers   Allergies  Allergen Reactions  . Iron     GI  . Meloxicam Other (See Comments)    Abd pain  . Meperidine Hcl Other (See Comments)    agitated  . Pravastatin     myalgias  . Zolpidem Tartrate Other (See Comments)    confusion  . Codeine Palpitations    "heart raced"    Family History  Problem Relation Age of Onset  . Asthma Sister   . Cancer Sister     breast, colon  . Diabetes Mother   . Cancer Mother     breast  . Diabetes Sister   . Cancer Sister 82  breast  . Heart disease Father   . Hypertension Father   . Cancer Brother     leukemia  . Cancer Brother     lung  . Hypertension Brother   . Hyperlipidemia Brother       Prior to Admission medications   Medication Sig Start Date End Date Taking? Authorizing Provider  Ascorbic Acid (VITAMIN C) 1000 MG tablet Take 1,000 mg by mouth daily.   Yes Historical Provider, MD  aspirin 81 MG EC tablet Take 81 mg by mouth every Monday, Wednesday, and Friday.    Yes Historical Provider, MD  benazepril-hydrochlorthiazide (LOTENSIN HCT) 20-12.5 MG per tablet Take 1 tablet by mouth daily. 05/21/13  Yes Unk Pinto, MD  Cholecalciferol (VITAMIN D) 2000 UNITS CAPS Take 2,000 Units by mouth every morning.    Yes Historical Provider, MD  citalopram (CELEXA) 20 MG tablet Take 20 mg by mouth every morning.    Yes Historical Provider, MD  clopidogrel (PLAVIX) 75 MG tablet Take 75 mg by mouth See admin instructions. Take 75 mg by mouth every Tuesday, Thursday and saturday   Yes Historical Provider, MD  Flaxseed, Linseed, (FLAXSEED OIL) 1000 MG CAPS Take 1,000 mg by mouth every morning.    Yes Historical Provider, MD  levothyroxine (SYNTHROID, LEVOTHROID) 75 MCG tablet take 1 tablet by mouth once daily 07/23/13  Yes Unk Pinto, MD  loratadine (CLARITIN) 10 MG tablet Take 10 mg by mouth every morning.    Yes Historical Provider, MD  magnesium gluconate (MAGONATE) 500 MG tablet Take 500 mg by mouth daily.   Yes Historical Provider, MD  metFORMIN (GLUCOPHAGE-XR) 500 MG 24 hr tablet Take 500-1,000 mg by mouth daily.    Yes Historical Provider, MD  Multiple Vitamins-Minerals (CENTRUM SILVER PO) Take 1 tablet by mouth daily.    Yes Historical Provider, MD  predniSONE (DELTASONE) 5 MG tablet Take 5 mg by mouth daily with breakfast.   Yes Historical Provider, MD  Probiotic Product (RESTORA PO) Take 1 tablet by mouth daily.    Yes Historical Provider, MD  QUEtiapine (SEROQUEL) 50 MG tablet Take 75 mg by mouth at bedtime.    Yes Historical Provider, MD  verapamil (CALAN-SR) 120 MG CR tablet Take 120 mg by mouth every morning.   Yes Historical Provider, MD     Physical Exam: Filed Vitals:   11/04/13 1315 11/04/13 1500 11/04/13 1635 11/04/13 1708  BP: 110/45 113/47 121/54   Pulse: 90 92 86   Temp:    97.9 F (36.6 C)  TempSrc:    Oral  Resp: 18 20 18    Height:      Weight:      SpO2: 91% 93% 92%      General: AA- pleasant and in no distress, there is disorientation to events, place, time HEENT: Normocephalic and Atraumatic, Mucous membranes pink                PERRLA; EOM intact; No scleral icterus,                 Nares: Patent, Oropharynx: Clear, Fair Dentition                 Neck: FROM, no  cervical lymphadenopathy, thyromegaly, carotid bruit or JVD;  Breasts: deferred CHEST WALL: No tenderness  CHEST: Normal respiration, clear to auscultation bilaterally  HEART: Regular rate and rhythm; no murmurs rubs or gallops  BACK: No kyphosis or scoliosis; no CVA tenderness  ABDOMEN: Positive Bowel Sounds, soft, non-tender; no  masses, no organomegaly Rectal Exam: deferred EXTREMITIES: No cyanosis, clubbing, or edema Genitalia: not examined  SKIN:  no rash or ulceration  CNS: Alert and Oriented x 4, Nonfocal exam, CN 2-12 intact  Labs on Admission:  Basic Metabolic Panel:  Recent Labs Lab 11/04/13 1216  NA 141  K 2.8*  CL 101  CO2 25  GLUCOSE 141*  BUN 21  CREATININE 0.93  CALCIUM 9.0   Liver Function Tests: No results found for this basename: AST, ALT, ALKPHOS, BILITOT, PROT, ALBUMIN,  in the last 168 hours No results found for this basename: LIPASE, AMYLASE,  in the last 168 hours No results found for this basename: AMMONIA,  in the last 168 hours CBC:  Recent Labs Lab 11/04/13 1216  WBC 3.5*  NEUTROABS 2.5  HGB 11.5*  HCT 34.0*  MCV 90.4  PLT 167   Cardiac Enzymes: No results found for this basename: CKTOTAL, CKMB, CKMBINDEX, TROPONINI,  in the last 168 hours  BNP (last 3 results) No results found for this basename: PROBNP,  in the last 8760 hours CBG: No results found for this basename: GLUCAP,  in the last 168 hours  Radiological Exams on Admission: Ct Head Wo Contrast  11/04/2013   CLINICAL DATA:  Confusion. Progressive shortness of Breath over last 5 days.  EXAM: CT HEAD WITHOUT CONTRAST  TECHNIQUE: Contiguous axial images were obtained from the base of the skull through the vertex without intravenous contrast.  COMPARISON:  MR 08/15/2010  FINDINGS: Prominence of the sulci and ventricles noted compatible with brain atrophy. Diffuse low attenuation throughout the subcortical and periventricular white matter is identified compatible with chronic small  vessel ischemic disease. The paranasal sinuses are clear. The mastoid air cells are clear. The calvarium is intact.  IMPRESSION: 1. No acute intracranial abnormalities. 2. Small vessel ischemic disease and brain atrophy.   Electronically Signed   By: Kerby Moors M.D.   On: 11/04/2013 16:00   Dg Chest Portable 1 View  11/04/2013   CLINICAL DATA:  Shortness of breath.  COPD.  Weakness.  Diabetes.  EXAM: PORTABLE CHEST - 1 VIEW  COMPARISON:  07/07/2013  FINDINGS: Mild cardiomegaly stable. Both lungs are clear. Mild hyperinflation again noted. No evidence of pleural effusion. No mass or lymphadenopathy identified.  IMPRESSION: Stable mild cardiomegaly and probable COPD.  No active disease.   Electronically Signed   By: Earle Gell M.D.   On: 11/04/2013 13:00    EKG: Independently reviewed. ST depression in inf leads, V2 and V3  Assessment/Plan Principal Problem:   Chest pain - With EKG changes- follow troponins- consult cardiology - monitor on telemetry - cont ASA and Plavix  Active Problems:  Dementia -with hallucinations -  UA negative for infection - will check B12 and RPR levels    Hypothyroidism - check TSH and Free T4 in setting of increasing confusion    Essential hypertension - cont Verapamil and diuretics  Dyspnea - cough/ wheeze - cont Prednisone - add nebs - follow for development of acute bronchitis-     Postinflammatory pulmonary fibrosis     Hypokalemia - replace K IV and PO    DM type 2 (diabetes mellitus, type 2) - sliding scale insulin- hold Metformin    Consulted: Cardiology  Code Status: Full code  Family Communication: with children DVT Prophylaxis:Heparin  Time spent: 53 min  [place], MD [facility]  If 7PM-7AM, please contact night-coverage [email or web address] 11/04/2013, 5:10 PM

## 2013-11-04 NOTE — ED Notes (Signed)
Pt ambulated to the bathroom.  

## 2013-11-04 NOTE — ED Notes (Signed)
Pt attempted to provide UA sample but missed hat container.

## 2013-11-04 NOTE — Consult Note (Signed)
CARDIOLOGY CONSULT NOTE   Patient ID: [name] [name] MRN: [id number] DOB/AGE: [birth year] 80 y.o.  Admit Date: 11/04/2013  Primary Physician: [name] Richards, MD  Primary Cardiologist     [name] [name] Reason for Consultation:   Clinical Summary [name] [name] is a 80 y.o.female. She has had no prior cardiac assessment. She does have dementia. The emergency room records suggest that she has had some further mental status changes in the past few days. I did not ask her family about this. The family says that she has good quality of life and they would want complete evaluation. However the final decision maker is her son who will be here at the hospital tomorrow. She actually had a CT scan of the head in the emergency room showing no acute abnormalities.  Today she began to have chest discomfort and shortness of breath. However she cannot tell us much about this. Eventually he am asked to arrive and a nebulizer was placed. She felt better. When she arrived at the emergency room an EKG was done. Later a second EKG was done. I was given the impression by telephone that the first EKG showed significant ST changes that improved. Review of the EKG shows that there were some ST changes in the first EKG. There is slight change for the second EKG. Her first troponin is normal. The family relates that she does have exertional shortness of breath.   Allergies  Allergen Reactions  . Iron     GI  . Meloxicam Other (See Comments)    Abd pain  . Meperidine Hcl Other (See Comments)    agitated  . Pravastatin     myalgias  . Zolpidem Tartrate Other (See Comments)    confusion  . Codeine Palpitations    "heart raced"    Medications Scheduled Medications: . aspirin  81 mg Oral Q M,W,F  . benazepril  20 mg Oral Daily  . [START ON 11/05/2013] citalopram  20 mg Oral q morning - 10a  . clopidogrel  75 mg Oral See admin instructions  . heparin  5,000 Units Subcutaneous 3 times per day  .  hydrochlorothiazide  12.5 mg Oral Daily  . insulin aspart  0-5 Units Subcutaneous QHS  . insulin aspart  0-9 Units Subcutaneous TID WC  . [START ON 11/05/2013] levothyroxine  75 mcg Oral QAC breakfast  . [START ON 11/05/2013] loratadine  10 mg Oral q morning - 10a  . magnesium gluconate  500 mg Oral Daily  . potassium chloride  10 mEq Intravenous Once  . potassium chloride  10 mEq Intravenous Q1 Hr x 4  . [START ON 11/05/2013] predniSONE  5 mg Oral Q breakfast  . QUEtiapine  75 mg Oral QHS  . sodium chloride  3 mL Intravenous Q12H  . [START ON 11/05/2013] verapamil  120 mg Oral q morning - 10a  . [START ON 11/05/2013] vitamin C  1,000 mg Oral Daily     Infusions:     PRN Medications:  sodium chloride, acetaminophen, acetaminophen, albuterol, nitroGLYCERIN, sodium chloride   Past Medical History  Diagnosis Date  . Asthma   . Type II or unspecified type diabetes mellitus without mention of complication, not stated as uncontrolled   . Hyperlipidemia   . Chronic headaches   . Hypertension   . Pulmonary fibrosis     Chronic granulomatous changes on CT chest, minimal mediastinal LAN 3/09  . COPD (chronic obstructive pulmonary disease)   . Vitamin D  deficiency   . B12 deficiency   . Allergy   . Dementia   . Depression   . Hypothyroidism   . CKD (chronic kidney disease) stage 3, GFR 30-59 ml/min     Past Surgical History  Procedure Laterality Date  . Appendectomy    . Thyroidectomy    . Tubal ligation    . Esophagogastroduodenoscopy  2012    Gastritis  . Breast biopsy      right breast/benign  . Endometrial biopsy  1998    negative    Family History  Problem Relation Age of Onset  . Asthma Sister   . Cancer Sister     breast, colon  . Diabetes Mother   . Cancer Mother     breast  . Diabetes Sister   . Cancer Sister 2    breast  . Heart disease Father   . Hypertension Father   . Cancer Brother     leukemia  . Cancer Brother     lung  . Hypertension Brother    . Hyperlipidemia Brother     Social History [name] [name] reports that she has never smoked. She has never used smokeless tobacco. [name] [name] reports that she does not drink alcohol.  Review of Systems  Patient denies fever, chills, headache, sweats, rash, change in vision, change in hearing, nausea vomiting, urinary symptoms. All other systems are reviewed and are negative.  Physical Examination Blood pressure 123/59, pulse 84, temperature 99.3 F (37.4 C), temperature source Oral, resp. rate 18, height 5\' 5"  (1.651 m), weight 146 lb 9 oz (66.48 kg), SpO2 96.00%. No intake or output data in the 24 hours ending 11/04/13 1847  The patient is stable. Her daughter and daughter-in-law are in the room. She is oriented to person time and place. Head is atraumatic. Sclera and conjunctiva are normal. There is no jugulovenous distention. Lungs reveal scattered rhonchi. There is no respiratory distress. Cardiac exam reveals S1 and S2. There no significant murmurs. The abdomen is soft. There is no peripheral edema. There no musculoskeletal deformities. There are no skin rashes.  Prior Cardiac Testing/Procedures  Lab Results  Basic Metabolic Panel:  Recent Labs Lab 11/04/13 1216  NA 141  K 2.8*  CL 101  CO2 25  GLUCOSE 141*  BUN 21  CREATININE 0.93  CALCIUM 9.0    Liver Function Tests: No results found for this basename: AST, ALT, ALKPHOS, BILITOT, PROT, ALBUMIN,  in the last 168 hours  CBC:  Recent Labs Lab 11/04/13 1216  WBC 3.5*  NEUTROABS 2.5  HGB 11.5*  HCT 34.0*  MCV 90.4  PLT 167    Cardiac Enzymes: No results found for this basename: CKTOTAL, CKMB, CKMBINDEX, TROPONINI,  in the last 168 hours  BNP: No components found with this basename: POCBNP,    Radiology: Ct Head Wo Contrast  11/04/2013   CLINICAL DATA:  Confusion. Progressive shortness of Breath over last 5 days.  EXAM: CT HEAD WITHOUT CONTRAST  TECHNIQUE: Contiguous axial images were obtained from the base  of the skull through the vertex without intravenous contrast.  COMPARISON:  MR 08/15/2010  FINDINGS: Prominence of the sulci and ventricles noted compatible with brain atrophy. Diffuse low attenuation throughout the subcortical and periventricular white matter is identified compatible with chronic small vessel ischemic disease. The paranasal sinuses are clear. The mastoid air cells are clear. The calvarium is intact.  IMPRESSION: 1. No acute intracranial abnormalities. 2. Small vessel ischemic disease and brain atrophy.   Electronically  Signed   By: Kerby Moors M.D.   On: 11/04/2013 16:00   Dg Chest Portable 1 View  11/04/2013   CLINICAL DATA:  Shortness of breath.  COPD.  Weakness.  Diabetes.  EXAM: PORTABLE CHEST - 1 VIEW  COMPARISON:  07/07/2013  FINDINGS: Mild cardiomegaly stable. Both lungs are clear. Mild hyperinflation again noted. No evidence of pleural effusion. No mass or lymphadenopathy identified.  IMPRESSION: Stable mild cardiomegaly and probable COPD.  No active disease.   Electronically Signed   By: Earle Gell M.D.   On: 11/04/2013 13:00    ECG:   I have reviewed the 2 EKGs from today. There is old decreased anterior R wave progression. There is mild diffuse nonspecific ST-T wave changes.   Impression and Recommendations    Chest pain     Presenting story today with chest pain and shortness of breath. The beginning of this was not witnessed. She received a nebulizer treatment by EMS. She stabilized by the time of arrival at the hospital. EKG shows some mild nonspecific changes. They are not diagnostic of significant ischemia. The first troponin is normal. We will obtain more troponins.    Hypothyroidism   Essential hypertension    Postinflammatory pulmonary fibrosis     I do not know what role this is playing with her exertional shortness of breath. We should obtain O2 sats with her walking to see if she needs oxygen outside of the hospital.    Dementia     There is dementia.  The patient's daughter and daughter-in-law he implied that they would want complete cardiac evaluation. He will be helpful to obtain more information from the patient's son tomorrow morning. The note from the ER implies that there were recent behavioral changes on top of mild chronic changes.    Hypokalemia    Patient is receiving potassium.    DM type 2 (diabetes mellitus, type 2)     On prednisone therapy    Patient is on outpatient steroids and these have been continued.    DOE (dyspnea on exertion)   At this point it is not clear if her exertional shortness of breath his pulmonary or cardiac. We will need to assess this further.  The plan will be to obtain a 2-D echo. Unless her troponins are positive, I am inclined to proceed with pharmacologic nuclear stress testing tomorrow. I would also plan to obtain O2 sats with her walking. I would plan to talk further with her son about severity of her dementia. If troponins are positive, cardiac catheterization could be considered, but only after further clarification of her mental status with her son.     Daryel November, MD  11/04/2013, 6:47 PM

## 2013-11-04 NOTE — ED Notes (Signed)
Contacted CT regarding Delay, patient and EDP updated.

## 2013-11-04 NOTE — ED Notes (Signed)
Per EMS pt from home c/o progressive shortness of breath over the past 5 days. Pt has bilateral upper lobe expiratory wheezing, & rhonci throughout. Pt SpO2 sats initally 94%/RA. Pt. Given 5mg  albuterol and 0.5mg  of Atrovent. 125 mg of IV solumedrol. Pt SpO2 99%/RA. Pt in NAD able to speak full sentences.

## 2013-11-04 NOTE — ED Provider Notes (Signed)
CSN: [id number]     Arrival date & time 11/04/13  1124 History   First MD Initiated Contact with Patient 11/04/13 1143     Chief Complaint  Patient presents with  . Shortness of Breath      Patient is a 80 y.o. female presenting with shortness of breath. The history is provided by the patient.  Shortness of Breath Severity:  Moderate Onset quality:  Gradual Timing:  Constant Progression:  Unchanged Chronicity:  New Relieved by:  Nothing Worsened by:  Exertion Associated symptoms: chest pain   Associated symptoms: no fever and no vomiting   Patient presents for CP/SOB She reports this morning she has had increased CP/SOB She reports that she has h/o COPD and feels SOB frequently but this feels different   No fever/vomiting Denies h/o CAD/DVT/PE Family reports an episode of confusion last night but that has improved this morning She is not on home oxygen per family  Past Medical History  Diagnosis Date  . Asthma   . Type II or unspecified type diabetes mellitus without mention of complication, not stated as uncontrolled   . Hyperlipidemia   . Chronic headaches   . Hypertension   . Pulmonary fibrosis     Chronic granulomatous changes on CT chest, minimal mediastinal LAN 3/09  . COPD (chronic obstructive pulmonary disease)   . Vitamin D deficiency   . B12 deficiency   . Allergy   . Dementia   . Depression   . Hypothyroidism   . CKD (chronic kidney disease) stage 3, GFR 30-59 ml/min    Past Surgical History  Procedure Laterality Date  . Appendectomy    . Thyroidectomy    . Tubal ligation    . Esophagogastroduodenoscopy  2012    Gastritis  . Breast biopsy      right breast/benign  . Endometrial biopsy  1998    negative   Family History  Problem Relation Age of Onset  . Asthma Sister   . Cancer Sister     breast, colon  . Diabetes Mother   . Cancer Mother     breast  . Diabetes Sister   . Cancer Sister 60    breast  . Heart disease Father   .  Hypertension Father   . Cancer Brother     leukemia  . Cancer Brother     lung  . Hypertension Brother   . Hyperlipidemia Brother    History  Substance Use Topics  . Smoking status: Never Smoker   . Smokeless tobacco: Never Used  . Alcohol Use: No   OB History   Grav Para Term Preterm Abortions TAB SAB Ect Mult Living                 Review of Systems  Constitutional: Negative for fever.  Respiratory: Positive for shortness of breath.   Cardiovascular: Positive for chest pain.  Gastrointestinal: Negative for vomiting and diarrhea.  All other systems reviewed and are negative.     Allergies  Iron; Meloxicam; Meperidine hcl; Pravastatin; Zolpidem tartrate; and Codeine  Home Medications   Prior to Admission medications   Medication Sig Start Date End Date Taking? Authorizing Provider  aspirin 81 MG EC tablet Take 81 mg by mouth daily. Takes on mon, wed and fri, opposite her Plavix on tu th and sat    Historical Provider, MD  benazepril-hydrochlorthiazide (LOTENSIN HCT) 20-12.5 MG per tablet Take 1 tablet by mouth daily. 05/21/13   Unk Pinto, MD  buPROPion (  WELLBUTRIN XL) 150 MG 24 hr tablet Take 150 mg by mouth daily. 05/18/13   Historical Provider, MD  cholecalciferol (VITAMIN D) 1000 UNITS tablet Take 2,000 Units by mouth 2 (two) times daily.     Historical Provider, MD  citalopram (CELEXA) 20 MG tablet Take 20-40 mg by mouth daily.    Historical Provider, MD  clopidogrel (PLAVIX) 75 MG tablet Take 75 mg by mouth See admin instructions. Take 75 mg by mouth on Tuesday, Thursday and saturday    Historical Provider, MD  Flaxseed, Linseed, (FLAXSEED OIL) 1000 MG CAPS Take 1,000 mg by mouth 2 (two) times daily.     Historical Provider, MD  levofloxacin (LEVAQUIN) 500 MG tablet Take 500 mg by mouth daily. 06/25/13   Historical Provider, MD  levothyroxine (SYNTHROID, LEVOTHROID) 75 MCG tablet take 1 tablet by mouth once daily 07/23/13   Unk Pinto, MD  loratadine  (CLARITIN) 10 MG tablet Take 10 mg by mouth daily.    Historical Provider, MD  Magnesium Oxide 400 (241.3 MG) MG TABS Take 400 mg by mouth 3 (three) times daily.     Historical Provider, MD  metFORMIN (GLUCOPHAGE-XR) 500 MG 24 hr tablet Take 500-1,000 mg by mouth daily with breakfast.    Historical Provider, MD  Multiple Vitamins-Minerals (CENTRUM SILVER PO) Take 1 tablet by mouth daily.     Historical Provider, MD  Probiotic Product (RESTORA PO) Take 1 tablet by mouth daily.     Historical Provider, MD  QUEtiapine (SEROQUEL) 50 MG tablet Take 50-100 mg by mouth at bedtime.    Historical Provider, MD  verapamil (CALAN-SR) 240 MG CR tablet take 1 tablet by mouth once daily for blood pressure 10/15/13   Unk Pinto, MD  verapamil (COVERA HS) 240 MG (CO) 24 hr tablet Take 240 mg by mouth at bedtime.    Historical Provider, MD   BP 131/50  Pulse 96  Temp(Src) 98.1 F (36.7 C) (Oral)  Resp 18  Ht 5\' 5"  (1.651 m)  Wt 146 lb 9 oz (66.48 kg)  BMI 24.39 kg/m2  SpO2 97% Physical Exam CONSTITUTIONAL: Well developed/well nourished HEAD: Normocephalic/atraumatic EYES: EOMI/PERRL ENMT: Mucous membranes moist NECK: supple no meningeal signs CV: S1/S2 noted, no murmurs/rubs/gallops noted LUNGS:scattered wheeze noted bilaterally, no apparent distress ABDOMEN: soft, nontender, no rebound or guarding GU:no cva tenderness NEURO: Pt is awake/alert, moves all extremitiesx4 EXTREMITIES: pulses normal, full ROM, no LE edema noted SKIN: warm, color normal PSYCH: no abnormalities of mood noted  ED Course  Procedures   12:24 PM Pt here for CP/SOB She appears uncomfortable, though history unreliable (unable to fully describe her CP) Her EKG is abnormal/changed from prior Cardiac workup enacted (she has been given nebs/steroids prior to arrival by EMS) 1:49 PM D/w her PCP Reports pt had significant episode of AMS last night which is new for her Delirium of unclear etiology She is now improved and  does not appear confused Will get CT head then admit 4:20 PM Ct head negative Will admit for chest pain workup as well as possible delirium workup D/w dr Wynelle Cleveland, will admit She requests IV potassium  Labs Review Labs Reviewed  BASIC METABOLIC PANEL - Abnormal; Notable for the following:    Potassium 2.8 (*)    Glucose, Bld 141 (*)    GFR calc non Af Amer 56 (*)    GFR calc Af Amer 65 (*)    All other components within normal limits  CBC WITH DIFFERENTIAL - Abnormal; Notable for the following:  WBC 3.5 (*)    RBC 3.76 (*)    Hemoglobin 11.5 (*)    HCT 34.0 (*)    Lymphs Abs 0.6 (*)    All other components within normal limits  URINALYSIS, ROUTINE W REFLEX MICROSCOPIC  I-STAT TROPOININ, ED    Imaging Review Dg Chest Portable 1 View  11/04/2013   CLINICAL DATA:  Shortness of breath.  COPD.  Weakness.  Diabetes.  EXAM: PORTABLE CHEST - 1 VIEW  COMPARISON:  07/07/2013  FINDINGS: Mild cardiomegaly stable. Both lungs are clear. Mild hyperinflation again noted. No evidence of pleural effusion. No mass or lymphadenopathy identified.  IMPRESSION: Stable mild cardiomegaly and probable COPD.  No active disease.   Electronically Signed   By: Earle Gell M.D.   On: 11/04/2013 13:00     EKG Interpretation   Date/Time:  Wednesday November 04 2013 11:34:12 EDT Ventricular Rate:  94 PR Interval:  152 QRS Duration: 103 QT Interval:  360 QTC Calculation: 450 R Axis:   -57 Text Interpretation:  Sinus rhythm Left axis deviation Anteroseptal  infarct, age indeterminate Borderline ST depression, anterolateral leads  changed from prior Confirmed by Christy Gentles  MD, [place] ([id number]) on 11/04/2013  11:45:28 AM      MDM   Final diagnoses:  Chest pain, rule out acute myocardial infarction  Hypokalemia  Altered mental status, unspecified altered mental status type    Nursing notes including past medical history and social history reviewed and considered in documentation xrays reviewed and  considered Labs/vital reviewed and considered     Sharyon Cable, MD 11/04/13 1621

## 2013-11-05 ENCOUNTER — Ambulatory Visit: Payer: Self-pay | Admitting: Emergency Medicine

## 2013-11-05 ENCOUNTER — Inpatient Hospital Stay (HOSPITAL_COMMUNITY): Payer: Medicare Other

## 2013-11-05 ENCOUNTER — Ambulatory Visit: Payer: Self-pay | Admitting: Physician Assistant

## 2013-11-05 DIAGNOSIS — E119 Type 2 diabetes mellitus without complications: Secondary | ICD-10-CM

## 2013-11-05 DIAGNOSIS — R079 Chest pain, unspecified: Secondary | ICD-10-CM

## 2013-11-05 DIAGNOSIS — I272 Other secondary pulmonary hypertension: Secondary | ICD-10-CM

## 2013-11-05 DIAGNOSIS — R072 Precordial pain: Secondary | ICD-10-CM

## 2013-11-05 DIAGNOSIS — R0609 Other forms of dyspnea: Secondary | ICD-10-CM

## 2013-11-05 DIAGNOSIS — F0390 Unspecified dementia without behavioral disturbance: Secondary | ICD-10-CM

## 2013-11-05 LAB — CBC
HCT: 31.8 % — ABNORMAL LOW (ref 36.0–46.0)
Hemoglobin: 10.8 g/dL — ABNORMAL LOW (ref 12.0–15.0)
MCH: 30.2 pg (ref 26.0–34.0)
MCHC: 34 g/dL (ref 30.0–36.0)
MCV: 88.8 fL (ref 78.0–100.0)
PLATELETS: 199 10*3/uL (ref 150–400)
RBC: 3.58 MIL/uL — ABNORMAL LOW (ref 3.87–5.11)
RDW: 12.4 % (ref 11.5–15.5)
WBC: 3.8 10*3/uL — AB (ref 4.0–10.5)

## 2013-11-05 LAB — GLUCOSE, CAPILLARY
GLUCOSE-CAPILLARY: 95 mg/dL (ref 70–99)
Glucose-Capillary: 115 mg/dL — ABNORMAL HIGH (ref 70–99)
Glucose-Capillary: 149 mg/dL — ABNORMAL HIGH (ref 70–99)
Glucose-Capillary: 143 mg/dL — ABNORMAL HIGH (ref 70–99)

## 2013-11-05 LAB — T4, FREE: Free T4: 0.89 ng/dL (ref 0.80–1.80)

## 2013-11-05 LAB — BASIC METABOLIC PANEL
ANION GAP: 12 (ref 5–15)
Anion gap: 15 (ref 5–15)
BUN: 21 mg/dL (ref 6–23)
BUN: 24 mg/dL — ABNORMAL HIGH (ref 6–23)
CALCIUM: 9 mg/dL (ref 8.4–10.5)
CO2: 25 meq/L (ref 19–32)
CO2: 24 mEq/L (ref 19–32)
CREATININE: 0.83 mg/dL (ref 0.50–1.10)
Calcium: 8.8 mg/dL (ref 8.4–10.5)
Chloride: 101 mEq/L (ref 96–112)
Chloride: 103 mEq/L (ref 96–112)
Creatinine, Ser: 0.93 mg/dL (ref 0.50–1.10)
GFR calc Af Amer: 65 mL/min — ABNORMAL LOW (ref >90)
GFR calc Af Amer: 74 mL/min — ABNORMAL LOW (ref >90)
GFR, EST NON AFRICAN AMERICAN: 56 mL/min — AB (ref >90)
GFR, EST NON AFRICAN AMERICAN: 64 mL/min — AB (ref >90)
GLUCOSE: 141 mg/dL — AB (ref 70–99)
Glucose, Bld: 154 mg/dL — ABNORMAL HIGH (ref 70–99)
Potassium: 2.8 mEq/L — CL (ref 3.7–5.3)
Potassium: 4.6 mEq/L (ref 3.7–5.3)
SODIUM: 141 meq/L (ref 137–147)
SODIUM: 139 meq/L (ref 137–147)

## 2013-11-05 LAB — TSH: TSH: 0.369 u[IU]/mL (ref 0.350–4.500)

## 2013-11-05 LAB — TROPONIN I

## 2013-11-05 LAB — MAGNESIUM: Magnesium: 1.9 mg/dL (ref 1.5–2.5)

## 2013-11-05 LAB — HIV ANTIBODY (ROUTINE TESTING W REFLEX): HIV 1&2 Ab, 4th Generation: NONREACTIVE

## 2013-11-05 LAB — RPR

## 2013-11-05 MED ORDER — METHYLPREDNISOLONE SODIUM SUCC 125 MG IJ SOLR
60.0000 mg | Freq: Two times a day (BID) | INTRAMUSCULAR | Status: DC
  Administered 2013-11-05 – 2013-11-06 (×2): 60 mg via INTRAVENOUS
  Filled 2013-11-05 (×4): qty 0.96

## 2013-11-05 MED ORDER — CLOPIDOGREL BISULFATE 75 MG PO TABS
75.0000 mg | ORAL_TABLET | ORAL | Status: DC
  Administered 2013-11-05: 75 mg via ORAL
  Filled 2013-11-05: qty 1

## 2013-11-05 MED ORDER — INFLUENZA VAC SPLIT QUAD 0.5 ML IM SUSY
0.5000 mL | PREFILLED_SYRINGE | INTRAMUSCULAR | Status: DC
  Filled 2013-11-05: qty 0.5

## 2013-11-05 MED ORDER — TECHNETIUM TC 99M SESTAMIBI GENERIC - CARDIOLITE
10.0000 | Freq: Once | INTRAVENOUS | Status: AC | PRN
  Administered 2013-11-05: 10 via INTRAVENOUS

## 2013-11-05 MED ORDER — TECHNETIUM TC 99M SESTAMIBI GENERIC - CARDIOLITE
30.0000 | Freq: Once | INTRAVENOUS | Status: AC | PRN
  Administered 2013-11-05: 30 via INTRAVENOUS

## 2013-11-05 MED ORDER — REGADENOSON 0.4 MG/5ML IV SOLN
INTRAVENOUS | Status: AC
  Administered 2013-11-05: 0.4 mg via INTRAVENOUS
  Filled 2013-11-05: qty 5

## 2013-11-05 MED ORDER — INFLUENZA VAC SPLIT QUAD 0.25 ML IM SUSY: 0.2500 mL | PREFILLED_SYRINGE | INTRAMUSCULAR | Status: DC

## 2013-11-05 MED ORDER — ALBUTEROL SULFATE (2.5 MG/3ML) 0.083% IN NEBU
2.5000 mg | INHALATION_SOLUTION | Freq: Four times a day (QID) | RESPIRATORY_TRACT | Status: DC
  Filled 2013-11-05: qty 3

## 2013-11-05 MED ORDER — REGADENOSON 0.4 MG/5ML IV SOLN
0.4000 mg | Freq: Once | INTRAVENOUS | Status: AC
  Administered 2013-11-05: 0.4 mg via INTRAVENOUS
  Filled 2013-11-05: qty 5

## 2013-11-05 NOTE — Progress Notes (Signed)
[facility] PROGRESS NOTE  [name] [name] BHA:[id number] DOB: 1930/01/29 DOA: 11/04/2013 PCP: Alesia Richards, MD  Assessment/Plan: Atypical Chest pain-? Transient/very poor historian - With EKG changes, cardiac enzymes negative - for ECHO and Myoview today per cards - cont ASA and Plavix   Dementia  -with hallucinations  -stable, B12 and RPR level normal  Hypothyroidism  - TSH and Free T4 pending  Essential hypertension  - cont Verapamil and diuretics   COPD exacerbation/Pulm fibrosis -stop Prednisone, add Solumedrol 60mg  q12 -nebs-albuterol scheduled and PRN  Hypokalemia  - replaced  DM type 2 (diabetes mellitus, type 2)  - sliding scale insulin- hold Metformin  DVT proph: Hep SQ  Code Status: Full Code Family Communication: d/w son & daughter at bedside Disposition Plan: home pending workup   Consultants:  Cards  HPI/Subjective: complains of cough, breathing almost at baseline  Objective: Filed Vitals:   11/05/13 1500  BP: 127/82  Pulse: 73  Temp: 98.6 F (37 C)  Resp: 15    Intake/Output Summary (Last 24 hours) at 11/05/13 1518 Last data filed at 11/05/13 0500  Gross per 24 hour  Intake      0 ml  Output   1050 ml  Net  -1050 ml   Filed Weights   11/04/13 1128 11/05/13 0556  Weight: 66.48 kg (146 lb 9 oz) 69.809 kg (153 lb 14.4 oz)    Exam:   General:  AAOx3  Cardiovascular: S1S2/RRR  Respiratory: crackles in both lower lungs  Abdomen: soft, Nt, BS present  Musculoskeletal: no edema c/c   Data Reviewed: Basic Metabolic Panel:  Recent Labs Lab 11/04/13 1216 11/05/13 0253  NA 141 139  K 2.8* 4.6  CL 101 103  CO2 25 24  GLUCOSE 141* 154*  BUN 21 24*  CREATININE 0.93 0.83  CALCIUM 9.0 8.8  MG  --  1.9   Liver Function Tests: No results found for this basename: AST, ALT, ALKPHOS, BILITOT, PROT, ALBUMIN,  in the last 168 hours No results found for this basename: LIPASE, AMYLASE,  in the last 168 hours No  results found for this basename: AMMONIA,  in the last 168 hours CBC:  Recent Labs Lab 11/04/13 1216 11/05/13 0253  WBC 3.5* 3.8*  NEUTROABS 2.5  --   HGB 11.5* 10.8*  HCT 34.0* 31.8*  MCV 90.4 88.8  PLT 167 199   Cardiac Enzymes:  Recent Labs Lab 11/04/13 2232 11/05/13 0253  TROPONINI <0.30 <0.30   BNP (last 3 results) No results found for this basename: PROBNP,  in the last 8760 hours CBG:  Recent Labs Lab 11/04/13 1731 11/04/13 2107 11/05/13 0744 11/05/13 1145  GLUCAP 241* 240* 115* 95    No results found for this or any previous visit (from the past 240 hour(s)).   Studies: Ct Head Wo Contrast  11/04/2013   CLINICAL DATA:  Confusion. Progressive shortness of Breath over last 5 days.  EXAM: CT HEAD WITHOUT CONTRAST  TECHNIQUE: Contiguous axial images were obtained from the base of the skull through the vertex without intravenous contrast.  COMPARISON:  MR 08/15/2010  FINDINGS: Prominence of the sulci and ventricles noted compatible with brain atrophy. Diffuse low attenuation throughout the subcortical and periventricular white matter is identified compatible with chronic small vessel ischemic disease. The paranasal sinuses are clear. The mastoid air cells are clear. The calvarium is intact.  IMPRESSION: 1. No acute intracranial abnormalities. 2. Small vessel ischemic disease and brain atrophy.   Electronically Signed   By: Lovena Le  Clovis Riley M.D.   On: 11/04/2013 16:00   Dg Chest Portable 1 View  11/04/2013   CLINICAL DATA:  Shortness of breath.  COPD.  Weakness.  Diabetes.  EXAM: PORTABLE CHEST - 1 VIEW  COMPARISON:  07/07/2013  FINDINGS: Mild cardiomegaly stable. Both lungs are clear. Mild hyperinflation again noted. No evidence of pleural effusion. No mass or lymphadenopathy identified.  IMPRESSION: Stable mild cardiomegaly and probable COPD.  No active disease.   Electronically Signed   By: Earle Gell M.D.   On: 11/04/2013 13:00    Scheduled Meds: . aspirin  81 mg  Oral Q M,W,F  . benazepril  20 mg Oral Daily  . citalopram  20 mg Oral q morning - 10a  . clopidogrel  75 mg Oral Once per day on Tue Thu Sat  . heparin  5,000 Units Subcutaneous 3 times per day  . hydrochlorothiazide  12.5 mg Oral Daily  . [START ON 11/06/2013] Influenza vac split quadrivalent PF  0.5 mL Intramuscular Tomorrow-1000  . insulin aspart  0-5 Units Subcutaneous QHS  . insulin aspart  0-9 Units Subcutaneous TID WC  . levothyroxine  75 mcg Oral QAC breakfast  . loratadine  10 mg Oral q morning - 10a  . magnesium gluconate  500 mg Oral Daily  . predniSONE  5 mg Oral Q breakfast  . QUEtiapine  75 mg Oral QHS  . sodium chloride  3 mL Intravenous Q12H  . verapamil  120 mg Oral q morning - 10a  . vitamin C  1,000 mg Oral Daily   Continuous Infusions:  Antibiotics Given (last 72 hours)   None      Principal Problem:   Chest pain Active Problems:   Hypothyroidism   Essential hypertension   Postinflammatory pulmonary fibrosis   Dementia   Hypokalemia   DM type 2 (diabetes mellitus, type 2)   On prednisone therapy   DOE (dyspnea on exertion)    Time spent: 85min    [facility] Pager [phone]. If 7PM-7AM, please contact night-coverage at [email or web address], password [facility] 11/05/2013, 3:18 PM  LOS: 1 day

## 2013-11-05 NOTE — Progress Notes (Signed)
  Echocardiogram 2D Echocardiogram has been performed.  [name] [name] FRANCES 11/05/2013, 10:14 AM

## 2013-11-05 NOTE — Care Management Note (Signed)
    Page 1 of 1   11/06/2013     2:42:30 PM CARE MANAGEMENT NOTE 11/06/2013  Patient:  [name] [name], [name] [name]   Account Number:  [id number]  Date Initiated:  11/05/2013  Documentation initiated by:  GRAVES-BIGELOW,Casilda Pickerill  Subjective/Objective Assessment:   Pt admitted for cp and SOB. Low potassium and given 2 runs IV Potassium. Pt is from home alone, however has family support. Pt uses [facility] at home.     Action/Plan:   CM did speak to family is regards to disposition needs. Family would like to have a [place] for medication management and CSW to see if pt will eventually need a SNF.   Anticipated DC Date:  11/06/2013   Anticipated DC Plan:  [place]  CM consult      [facility] Choice  HOME HEALTH   Choice offered to / List presented to:             Status of service:  Completed, signed off Medicare Important Message given?  NO (If response is "NO", the following Medicare IM given date fields will be blank) Date Medicare IM given:   Medicare IM given by:   Date Additional Medicare IM given:   Additional Medicare IM given by:    Discharge Disposition:  HOME/SELF CARE  Per UR Regulation:  Reviewed for med. necessity/level of care/duration of stay  If discussed at [place] of Stay Meetings, dates discussed:    Comments:  11-06-13 [place], RN,BSN [phone] Pt refusing [place] services at this time. Son was prvided the [facility] list if aid needed for services.

## 2013-11-05 NOTE — Progress Notes (Signed)
UR Completed Arkin Imran Graves-Bigelow, RN,BSN [phone]  

## 2013-11-05 NOTE — Progress Notes (Signed)
Patient Name: [name] [name] Date of Encounter: 11/05/2013     Principal Problem:   Chest pain Active Problems:   Hypothyroidism   Essential hypertension   Postinflammatory pulmonary fibrosis   Dementia   Hypokalemia   DM type 2 (diabetes mellitus, type 2)   On prednisone therapy   DOE (dyspnea on exertion)    SUBJECTIVE  Intermittent chest discomfort, some wheezing this morning per son.   CURRENT MEDS . aspirin  81 mg Oral Q M,W,F  . benazepril  20 mg Oral Daily  . citalopram  20 mg Oral q morning - 10a  . clopidogrel  75 mg Oral Once per day on Tue Thu Sat  . heparin  5,000 Units Subcutaneous 3 times per day  . hydrochlorothiazide  12.5 mg Oral Daily  . insulin aspart  0-5 Units Subcutaneous QHS  . insulin aspart  0-9 Units Subcutaneous TID WC  . levothyroxine  75 mcg Oral QAC breakfast  . loratadine  10 mg Oral q morning - 10a  . magnesium gluconate  500 mg Oral Daily  . predniSONE  5 mg Oral Q breakfast  . QUEtiapine  75 mg Oral QHS  . sodium chloride  3 mL Intravenous Q12H  . verapamil  120 mg Oral q morning - 10a  . vitamin C  1,000 mg Oral Daily    OBJECTIVE  Filed Vitals:   11/04/13 1708 11/04/13 1722 11/04/13 2110 11/05/13 0556  BP:  123/59 125/62 117/69  Pulse:  84 75 80  Temp: 97.9 F (36.6 C) 99.3 F (37.4 C) 98.3 F (36.8 C) 97.6 F (36.4 C)  TempSrc: Oral Oral Oral Oral  Resp:  18 18   Height:      Weight:    153 lb 14.4 oz (69.809 kg)  SpO2:  96% 99% 96%    Intake/Output Summary (Last 24 hours) at 11/05/13 0855 Last data filed at 11/05/13 0500  Gross per 24 hour  Intake      0 ml  Output   1050 ml  Net  -1050 ml   Filed Weights   11/04/13 1128 11/05/13 0556  Weight: 146 lb 9 oz (66.48 kg) 153 lb 14.4 oz (69.809 kg)    PHYSICAL EXAM  General: Pleasant, NAD. Neuro: Alert and oriented X 3. Moves all extremities spontaneously. Psych: Normal affect. HEENT:  Normal  Neck: Supple without bruits or JVD. Lungs:  Resp regular and  unlabored, CTA. Heart: RRR no s3, s4, or murmurs. Abdomen: Soft, non-tender, non-distended, BS + x 4.  Extremities: No clubbing, cyanosis or edema. DP/PT/Radials 2+ and equal bilaterally.  Accessory Clinical Findings  CBC  Recent Labs  11/04/13 1216 11/05/13 0253  WBC 3.5* 3.8*  NEUTROABS 2.5  --   HGB 11.5* 10.8*  HCT 34.0* 31.8*  MCV 90.4 88.8  PLT 167 735   Basic Metabolic Panel  Recent Labs  11/04/13 1216 11/05/13 0253  NA 141 139  K 2.8* 4.6  CL 101 103  CO2 25 24  GLUCOSE 141* 154*  BUN 21 24*  CREATININE 0.93 0.83  CALCIUM 9.0 8.8  MG  --  1.9   Cardiac Enzymes  Recent Labs  11/04/13 2232 11/05/13 0253  TROPONINI <0.30 <0.30   Thyroid Function Tests  Recent Labs  11/05/13 0253  TSH 0.369    TELE NSR with HR 60s, occasional SVT?    ECG  NSR with nonspecific ST depression  Echocardiogram  pending    Radiology/Studies  Ct Head Wo  Contrast  11/04/2013   CLINICAL DATA:  Confusion. Progressive shortness of Breath over last 5 days.  EXAM: CT HEAD WITHOUT CONTRAST  TECHNIQUE: Contiguous axial images were obtained from the base of the skull through the vertex without intravenous contrast.  COMPARISON:  MR 08/15/2010  FINDINGS: Prominence of the sulci and ventricles noted compatible with brain atrophy. Diffuse low attenuation throughout the subcortical and periventricular white matter is identified compatible with chronic small vessel ischemic disease. The paranasal sinuses are clear. The mastoid air cells are clear. The calvarium is intact.  IMPRESSION: 1. No acute intracranial abnormalities. 2. Small vessel ischemic disease and brain atrophy.   Electronically Signed   By: Kerby Moors M.D.   On: 11/04/2013 16:00   Dg Chest Portable 1 View  11/04/2013   CLINICAL DATA:  Shortness of breath.  COPD.  Weakness.  Diabetes.  EXAM: PORTABLE CHEST - 1 VIEW  COMPARISON:  07/07/2013  FINDINGS: Mild cardiomegaly stable. Both lungs are clear. Mild  hyperinflation again noted. No evidence of pleural effusion. No mass or lymphadenopathy identified.  IMPRESSION: Stable mild cardiomegaly and probable COPD.  No active disease.   Electronically Signed   By: Earle Gell M.D.   On: 11/04/2013 13:00    ASSESSMENT AND PLAN  1. Chest pain with DOE  - nonspecific changes on EKG, troponin negative x2  - pending Echo and Lexiscan stress test today  2. Postinflammatory pulmonary fibrosis  - actively wheezing this morning, will give breathing treatment before going down  3. HTN 4. Hypothyroidism 5. Dementia 6. Chronic Anemia  Signed, Almyra Deforest PA-C Pager: [phone]  I have seen and examined the patient along with Almyra Deforest PA-C.  I have reviewed the chart, notes and new data.  I agree with PA's note.  Key new complaints: no chest pain; had wheezing earlier, but tolerated Lexiscan without a problem Key examination changes: no clinical HF or arrhythmia Key new findings / data: on my review, seems to have normal myocardial perfusion. Echo shows normal LV wall motion and EF, mild pulmonary artery hypertension (44 mm Hg)  PLAN: If final report on nuclear study is normal, no plan for further cardiac evaluation at this time. Consider PFTs to evaluate for cause of cor pulmonale. Possible GI source of symptoms.  Sanda Klein, MD, [place] [phone] 11/05/2013, 5:02 PM

## 2013-11-06 DIAGNOSIS — R0609 Other forms of dyspnea: Secondary | ICD-10-CM

## 2013-11-06 DIAGNOSIS — I10 Essential (primary) hypertension: Secondary | ICD-10-CM

## 2013-11-06 LAB — GLUCOSE, CAPILLARY
Glucose-Capillary: 202 mg/dL — ABNORMAL HIGH (ref 70–99)
Glucose-Capillary: 171 mg/dL — ABNORMAL HIGH (ref 70–99)

## 2013-11-06 MED ORDER — PREDNISONE 20 MG PO TABS
40.0000 mg | ORAL_TABLET | Freq: Every day | ORAL | Status: DC
  Filled 2013-11-06: qty 2

## 2013-11-06 MED ORDER — PREDNISONE 5 MG PO TABS: 5.0000 mg | ORAL_TABLET | Freq: Every day | ORAL | Status: DC

## 2013-11-06 MED ORDER — PREDNISONE 20 MG PO TABS: 40.0000 mg | ORAL_TABLET | Freq: Every day | ORAL | Status: DC

## 2013-11-06 MED ORDER — PREDNISONE 20 MG PO TABS
40.0000 mg | ORAL_TABLET | Freq: Every day | ORAL | Status: DC
Start: 2013-11-06 — End: 2013-11-06
  Filled 2013-11-06 (×2): qty 2

## 2013-11-06 NOTE — Evaluation (Signed)
Physical Therapy Evaluation Patient Details Name: [name] [name] MRN: [id number] DOB: 26-Jan-1930 Today's Date: 11/06/2013   History of Present Illness  Patient is a 80 y/o female admitted with chest pain and dyspnea noted to have EKG changes. Troponins negative x2. PMH of dementia, HTN, HLD, COPD, CKD and DM.    Clinical Impression  Patient presents with functional limitations due to deficits listed in PT problem list (see below). Pt with baseline cognitive deficits affecting safety awareness and safe mobility. Tolerated stair negotiation and ambulation with Min guard assist for safety due to balance deficits. Recommend 24/7 supervision at home initially as pt high falls risk. Pt would benefit from acute PT and follow up HHPT to improve safe mobility, reduce risk of falls and maximize independence.     Follow Up Recommendations Home health PT;Supervision/Assistance - 24 hour    Equipment Recommendations  None recommended by PT    Recommendations for Other Services OT consult     Precautions / Restrictions Precautions Precautions: Fall Restrictions Weight Bearing Restrictions: No      Mobility  Bed Mobility               General bed mobility comments: Pt received sitting EOB upon PT arrival.   Transfers Overall transfer level: Needs assistance Equipment used: None Transfers: Sit to/from Stand Sit to Stand: Min guard         General transfer comment: Stood from EOB x2, unsteady upon standing initially.  Ambulation/Gait Ambulation/Gait assistance: Min guard Ambulation Distance (Feet): 200 Feet Assistive device: Rolling walker (2 wheeled);None Gait Pattern/deviations: Step-through pattern;Decreased stride length;Trunk flexed Gait velocity: decreased   General Gait Details: Furniture walker when ambulating within room. Used RW for hallway ambulation. Occasional posterior trunk lean and almost LOB when gait speed decreased or during turns. Poor safety awareness. Mild  dyspnea noted however vitals stable throughout. Sa02 90%-98%.  Stairs Stairs: Yes Stairs assistance: Min guard Stair Management: One rail Right;Step to pattern Number of Stairs: 2 General stair comments: VC for safety.  Wheelchair Mobility    Modified Rankin (Stroke Patients Only)       Balance Overall balance assessment: Needs assistance;History of Falls Sitting-balance support: No upper extremity supported;Feet supported Sitting balance-Leahy Scale: Good Sitting balance - Comments: Able to reach outside BoS without LOB or difficulty. Able to pick tissue up off floor without losing balance.  Postural control: Posterior lean   Standing balance-Leahy Scale: Poor Standing balance comment: Requires UE support during dynamic standing activities. Uses furniture and RW for support during gait due to balance deficits.                              Pertinent Vitals/Pain Pain Assessment: No/denies pain    Home Living Family/patient expects to be discharged to:: Private residence Living Arrangements: Alone Available Help at Discharge: Family;Available PRN/intermittently Type of Home: House Home Access: Stairs to enter Entrance Stairs-Rails: Right Entrance Stairs-Number of Steps: 2 Home Layout: One level Home Equipment: Walker - 2 wheels;Cane - single point Additional Comments: Pt questionable historian due to hx of dementia.     Prior Function Level of Independence: Independent with assistive device(s)         Comments: Pt reports no AD for household ambulation but uses [facility] for community ambulation. Pt reports family drives her to grocery store, appts etc. Reports cooking, cleaning. Reports falls.     Hand Dominance        Extremity/Trunk Assessment  Upper Extremity Assessment: Overall WFL for tasks assessed           Lower Extremity Assessment: Generalized weakness         Communication   Communication: No difficulties  Cognition  Arousal/Alertness: Awake/alert Behavior During Therapy: WFL for tasks assessed/performed Overall Cognitive Status: History of cognitive impairments - at baseline       Memory: Decreased short-term memory ("Wait, what did you ask me again?")              General Comments General comments (skin integrity, edema, etc.): Discoloration of skin BLEs distal to knee.    Exercises        Assessment/Plan    PT Assessment Patient needs continued PT services  PT Diagnosis Difficulty walking;Generalized weakness   PT Problem List Decreased strength;Cardiopulmonary status limiting activity;Decreased cognition;Decreased activity tolerance;Decreased safety awareness;Decreased balance  PT Treatment Interventions Balance training;Gait training;Stair training;Cognitive remediation;Therapeutic activities;Therapeutic exercise;Patient/family education;Functional mobility training   PT Goals (Current goals can be found in the Care Plan section) Acute Rehab PT Goals Patient Stated Goal: to get better PT Goal Formulation: With patient Time For Goal Achievement: 11/20/13 Potential to Achieve Goals: Good    Frequency Min 3X/week   Barriers to discharge Decreased caregiver support Pt lives alone.    Co-evaluation               End of Session Equipment Utilized During Treatment: Gait belt Activity Tolerance: Patient tolerated treatment well Patient left: in bed;with call bell/phone within reach;with nursing/sitter in room (sitting EOB.) Nurse Communication: Mobility status    Functional Assessment Tool Used: Clinical judgment Functional Limitation: Mobility: Walking and moving around Mobility: Walking and Moving Around Current Status ([id number]): At least 1 percent but less than 20 percent impaired, limited or restricted Mobility: Walking and Moving Around Goal Status [id number]): At least 1 percent but less than 20 percent impaired, limited or restricted    Time: 1010-1035 PT Time  Calculation (min): 25 min   Charges:   PT Evaluation $Initial PT Evaluation Tier I: 1 Procedure PT Treatments $Gait Training: 8-22 mins   PT G Codes:   Functional Assessment Tool Used: Clinical judgment Functional Limitation: Mobility: Walking and moving around    [place], [place] 11/06/2013, 10:45 AM Candy Sledge, PT, DPT [phone]

## 2013-11-06 NOTE — Progress Notes (Signed)
[facility] PROGRESS NOTE  [name] [name] VQQ:[id number] DOB: 1929-06-11 DOA: 11/04/2013 PCP: Alesia Richards, MD  Assessment/Plan: Atypical Chest pain-? Transient/very poor historian - With EKG changes, cardiac enzymes negative - Myoview with small area in anterior wall concerning for ischemia-per Cards - cont ASA and Plavix   Dementia  -stable -stable, B12 and RPR level normal  Hypothyroidism  - TSH and Free T4 normal  Essential hypertension  - cont Verapamil and diuretics   COPD exacerbation/Pulm fibrosis -improving -stop Solumedrol 60mg  q12, pred taper -nebs-albuterol scheduled and PRN  Hypokalemia  - replaced  DM type 2 (diabetes mellitus, type 2)  - sliding scale insulin- hold Metformin  DVT proph: Hep SQ  Code Status: Full Code Family Communication: none at bedside Disposition Plan: home pending workup   Consultants:  Cards  HPI/Subjective: complains of cough, breathing almost at baseline, no further chest pain  Objective: Filed Vitals:   11/06/13 1057  BP: 140/71  Pulse: 88  Temp:   Resp:     Intake/Output Summary (Last 24 hours) at 11/06/13 1202 Last data filed at 11/06/13 1000  Gross per 24 hour  Intake      6 ml  Output      0 ml  Net      6 ml   Filed Weights   11/04/13 1128 11/05/13 0556 11/06/13 0525  Weight: 66.48 kg (146 lb 9 oz) 69.809 kg (153 lb 14.4 oz) 69.037 kg (152 lb 3.2 oz)    Exam:   General:  AAOx3  Cardiovascular: S1S2/RRR  Respiratory: clearer today  Abdomen: soft, Nt, BS present  Musculoskeletal: no edema c/c   Data Reviewed: Basic Metabolic Panel:  Recent Labs Lab 11/04/13 1216 11/05/13 0253  NA 141 139  K 2.8* 4.6  CL 101 103  CO2 25 24  GLUCOSE 141* 154*  BUN 21 24*  CREATININE 0.93 0.83  CALCIUM 9.0 8.8  MG  --  1.9   Liver Function Tests: No results found for this basename: AST, ALT, ALKPHOS, BILITOT, PROT, ALBUMIN,  in the last 168 hours No results found for this basename:  LIPASE, AMYLASE,  in the last 168 hours No results found for this basename: AMMONIA,  in the last 168 hours CBC:  Recent Labs Lab 11/04/13 1216 11/05/13 0253  WBC 3.5* 3.8*  NEUTROABS 2.5  --   HGB 11.5* 10.8*  HCT 34.0* 31.8*  MCV 90.4 88.8  PLT 167 199   Cardiac Enzymes:  Recent Labs Lab 11/04/13 2232 11/05/13 0253  TROPONINI <0.30 <0.30   BNP (last 3 results) No results found for this basename: PROBNP,  in the last 8760 hours CBG:  Recent Labs Lab 11/05/13 1145 11/05/13 1650 11/05/13 1943 11/06/13 0735 11/06/13 1140  GLUCAP 95 149* 143* 202* 171*    No results found for this or any previous visit (from the past 240 hour(s)).   Studies: Ct Head Wo Contrast  11/04/2013   CLINICAL DATA:  Confusion. Progressive shortness of Breath over last 5 days.  EXAM: CT HEAD WITHOUT CONTRAST  TECHNIQUE: Contiguous axial images were obtained from the base of the skull through the vertex without intravenous contrast.  COMPARISON:  MR 08/15/2010  FINDINGS: Prominence of the sulci and ventricles noted compatible with brain atrophy. Diffuse low attenuation throughout the subcortical and periventricular white matter is identified compatible with chronic small vessel ischemic disease. The paranasal sinuses are clear. The mastoid air cells are clear. The calvarium is intact.  IMPRESSION: 1. No acute intracranial abnormalities.  2. Small vessel ischemic disease and brain atrophy.   Electronically Signed   By: Kerby Moors M.D.   On: 11/04/2013 16:00   Nm Myocar Multi W/spect W/wall Motion / Ef  11/05/2013   CLINICAL DATA:  Chest pain  EXAM: MYOCARDIAL IMAGING WITH SPECT (REST AND PHARMACOLOGIC-STRESS)  GATED LEFT VENTRICULAR WALL MOTION STUDY  LEFT VENTRICULAR EJECTION FRACTION  TECHNIQUE: Standard myocardial SPECT imaging was performed after resting intravenous injection of 10 mCi Tc-9m sestamibi. Subsequently, intravenous infusion of Lexiscan was performed under the supervision of the  Cardiology staff. At peak effect of the drug, 30 mCi Tc-46m sestamibi was injected intravenously and standard myocardial SPECT imaging was performed. Quantitative gated imaging was also performed to evaluate left ventricular wall motion, and estimate left ventricular ejection fraction.  COMPARISON:  None.  FINDINGS: Baseline EKG:  NSR with no ST changes  EKG during Lexiscan infusion: NSR with wandering baseline artifact. Occasional PVC's. No acute ST changes. No chest pain or SOB  Raw images demonstrate increased gut uptake below the diaphragm and mild diaphragmatic attenuation.  Perfusion: Small in size, mild in intensity defect in the anterior wall worrisome for a subtle area of ischemia.  Wall Motion: Normal left ventricular wall motion. No left ventricular dilation.  Left Ventricular Ejection Fraction: 73 %  End diastolic volume 50 ml  End systolic volume 13 ml  IMPRESSION: 1. Subtle area of decreased uptake in the anterior wall worrisome for a small area or ischemia.  2. Normal left ventricular wall motion.  3. Left ventricular ejection fraction 73%  4. Intermediate-risk stress test findings*.  *2012 Appropriate Use Criteria for Coronary Revascularization Focused Update: J Am Coll Cardiol. 1779;39(0):300-923. http://content.airportbarriers.com.aspx?articleid=1201161   Electronically Signed   By: Fransico Him   On: 11/05/2013 17:04   Dg Chest Portable 1 View  11/04/2013   CLINICAL DATA:  Shortness of breath.  COPD.  Weakness.  Diabetes.  EXAM: PORTABLE CHEST - 1 VIEW  COMPARISON:  07/07/2013  FINDINGS: Mild cardiomegaly stable. Both lungs are clear. Mild hyperinflation again noted. No evidence of pleural effusion. No mass or lymphadenopathy identified.  IMPRESSION: Stable mild cardiomegaly and probable COPD.  No active disease.   Electronically Signed   By: Earle Gell M.D.   On: 11/04/2013 13:00    Scheduled Meds: . aspirin  81 mg Oral Q M,W,F  . benazepril  20 mg Oral Daily  . citalopram  20 mg  Oral q morning - 10a  . clopidogrel  75 mg Oral Once per day on Tue Thu Sat  . heparin  5,000 Units Subcutaneous 3 times per day  . hydrochlorothiazide  12.5 mg Oral Daily  . Influenza vac split quadrivalent PF  0.5 mL Intramuscular Tomorrow-1000  . insulin aspart  0-5 Units Subcutaneous QHS  . insulin aspart  0-9 Units Subcutaneous TID WC  . levothyroxine  75 mcg Oral QAC breakfast  . loratadine  10 mg Oral q morning - 10a  . magnesium gluconate  500 mg Oral Daily  . predniSONE  40 mg Oral Q breakfast  . QUEtiapine  75 mg Oral QHS  . sodium chloride  3 mL Intravenous Q12H  . verapamil  120 mg Oral q morning - 10a  . vitamin C  1,000 mg Oral Daily   Continuous Infusions:  Antibiotics Given (last 72 hours)   None      Principal Problem:   Chest pain Active Problems:   Hypothyroidism   Essential hypertension   Postinflammatory pulmonary fibrosis  Dementia   Hypokalemia   DM type 2 (diabetes mellitus, type 2)   On prednisone therapy   DOE (dyspnea on exertion)    Time spent: 9min    [facility] [facility] Pager [phone]. If 7PM-7AM, please contact night-coverage at [email or web address], password [email or web address] 11/06/2013, 12:02 PM  LOS: 2 days

## 2013-11-06 NOTE — Progress Notes (Signed)
Subjective: No further CP.   Objective: Vital signs in last 24 hours: Temp:  [97.7 F (36.5 C)-98.6 F (37 C)] 98 F (36.7 C) (10/09 0525) Pulse Rate:  [73-77] 77 (10/09 0525) Resp:  [15-18] 18 (10/09 0525) BP: (127-162)/(58-82) 151/72 mmHg (10/09 0525) SpO2:  [96 %] 96 % (10/09 0525) Weight:  [152 lb 3.2 oz (69.037 kg)] 152 lb 3.2 oz (69.037 kg) (10/09 0525) Last BM Date: 11/05/13  Intake/Output from previous day:   Intake/Output this shift:    Medications Current Facility-Administered Medications  Medication Dose Route Frequency Provider Last Rate Last Dose  . 0.9 %  sodium chloride infusion  250 mL Intravenous PRN Debbe Odea, MD      . acetaminophen (TYLENOL) tablet 650 mg  650 mg Oral Q6H PRN Debbe Odea, MD   650 mg at 11/05/13 2863   Or  . acetaminophen (TYLENOL) suppository 650 mg  650 mg Rectal Q6H PRN Debbe Odea, MD      . albuterol (PROVENTIL) (2.5 MG/3ML) 0.083% nebulizer solution 2.5 mg  2.5 mg Nebulization Q6H PRN Debbe Odea, MD      . aspirin EC tablet 81 mg  81 mg Oral Q M,W,F Saima Rizwan, MD   81 mg at 11/04/13 1800  . benazepril (LOTENSIN) tablet 20 mg  20 mg Oral Daily Debbe Odea, MD   20 mg at 11/05/13 1054  . citalopram (CELEXA) tablet 20 mg  20 mg Oral q morning - 10a Debbe Odea, MD   20 mg at 11/05/13 1054  . clopidogrel (PLAVIX) tablet 75 mg  75 mg Oral Once per day on Tue Thu Sat Domenic Polite, MD   75 mg at 11/05/13 8177  . heparin injection 5,000 Units  5,000 Units Subcutaneous 3 times per day Debbe Odea, MD   5,000 Units at 11/06/13 0656  . hydrochlorothiazide (MICROZIDE) capsule 12.5 mg  12.5 mg Oral Daily Debbe Odea, MD   12.5 mg at 11/05/13 1054  . Influenza vac split quadrivalent PF (FLUARIX) injection 0.5 mL  0.5 mL Intramuscular Tomorrow-1000 Domenic Polite, MD      . insulin aspart (novoLOG) injection 0-5 Units  0-5 Units Subcutaneous QHS Debbe Odea, MD   2 Units at 11/04/13 2128  . insulin aspart (novoLOG) injection 0-9  Units  0-9 Units Subcutaneous TID WC Debbe Odea, MD   3 Units at 11/06/13 0829  . levothyroxine (SYNTHROID, LEVOTHROID) tablet 75 mcg  75 mcg Oral QAC breakfast Debbe Odea, MD   75 mcg at 11/06/13 0828  . loratadine (CLARITIN) tablet 10 mg  10 mg Oral q morning - 10a Debbe Odea, MD   10 mg at 11/05/13 1054  . magnesium gluconate (MAGONATE) tablet 500 mg  500 mg Oral Daily Debbe Odea, MD   500 mg at 11/05/13 1053  . methylPREDNISolone sodium succinate (SOLU-MEDROL) 125 mg/2 mL injection 60 mg  60 mg Intravenous Q12H Domenic Polite, MD   60 mg at 11/06/13 1165  . nitroGLYCERIN (NITROSTAT) SL tablet 0.4 mg  0.4 mg Sublingual Q5 min PRN Sharyon Cable, MD      . QUEtiapine (SEROQUEL) tablet 75 mg  75 mg Oral QHS Debbe Odea, MD   75 mg at 11/05/13 2157  . sodium chloride 0.9 % injection 3 mL  3 mL Intravenous Q12H Debbe Odea, MD   3 mL at 11/05/13 2159  . sodium chloride 0.9 % injection 3 mL  3 mL Intravenous PRN Debbe Odea, MD      .  verapamil (CALAN-SR) CR tablet 120 mg  120 mg Oral q morning - 10a Debbe Odea, MD   120 mg at 11/05/13 1054  . vitamin C (ASCORBIC ACID) tablet 1,000 mg  1,000 mg Oral Daily Debbe Odea, MD   1,000 mg at 11/05/13 1054    PE: General appearance: alert, cooperative and no distress Lungs: clear to auscultation bilaterally Heart: regular rate and rhythm, S1, S2 normal, no murmur, click, rub or gallop Extremities: No LEE Pulses: 2+ and symmetric Skin: Warm and dry Neurologic: Grossly normal  Lab Results:   Recent Labs  11/04/13 1216 11/05/13 0253  WBC 3.5* 3.8*  HGB 11.5* 10.8*  HCT 34.0* 31.8*  PLT 167 199   BMET  Recent Labs  11/04/13 1216 11/05/13 0253  NA 141 139  K 2.8* 4.6  CL 101 103  CO2 25 24  GLUCOSE 141* 154*  BUN 21 24*  CREATININE 0.93 0.83  CALCIUM 9.0 8.8      Assessment/Plan  1. Chest pain with DOE  - nonspecific changes on EKG, troponin negative x2  -SPLexiscan stress yesterday revealing a subtle area of  decreased uptake in the anterior wall worrisome  for a small area or ischemia.  Normal left ventricular wall motion.  Left ventricular ejection fraction 73%  Intermediate-risk stress test findings*.   Dr. Sallyanne Kuster to review images and decide if cath is appropriate  2. Postinflammatory pulmonary fibrosis  - actively wheezing this morning, will give breathing treatment before going down  3. HTN   Bp elevated this morning but overall controlled.   4. Hypothyroidism  5. Dementia   CT head negative for acute changes 6. Chronic Anemia  7. Hypokalemia: resolved by labs yesterday      LOS: 2 days    HAGER, BRYAN PA-C 11/06/2013 8:31 AM  I have seen and examined the patient along with HAGER, BRYAN PA-C.  I have reviewed the chart, notes and new data.  I agree with PA's note.  I reviewed the formal interpretation of her nuclear study and took another look at the pictures. I still think they are not compelling for a true abnormality. If it is present, the defect is limited in size and mild in severity. I would continue with medical therapy for now. If symptoms recur, best option will then be coronary angiography.   Sanda Klein, MD, [place] [phone] 11/06/2013, 12:16 PM

## 2013-11-06 NOTE — Discharge Summary (Signed)
Physician Discharge Summary  [name] [name] JSH:[id number] DOB: 02-17-1929 DOA: 11/04/2013  PCP: Alesia Richards, MD  Admit date: 11/04/2013 Discharge date: 11/06/2013  Time spent: 45 minutes  Recommendations for Outpatient Follow-up:  1. PCP in 1 week  Discharge Diagnoses:  Principal Problem:   Chest pain   COPD exacerbation   Pulm fibrosis   Hypothyroidism   Essential hypertension   Postinflammatory pulmonary fibrosis   Dementia   Hypokalemia   DM type 2 (diabetes mellitus, type 2)   On prednisone therapy   DOE (dyspnea on exertion)   Discharge Condition: stable  Diet recommendation: diabetic  Filed Weights   11/04/13 1128 11/05/13 0556 11/06/13 0525  Weight: 66.48 kg (146 lb 9 oz) 69.809 kg (153 lb 14.4 oz) 69.037 kg (152 lb 3.2 oz)    History of present illness:  [name] [name] is a 80 y.o. female with PMH of COPD/ fibrosis, DM 2, HTN, dementia, hypothyroidism and CKD brought into the hospital by EMS for dyspnea. Family in the room was not present during the episode and due to dementia, the patient is not a good historian. Apparently she was short of breath and had chest pain. It is not certain if she was hypoxic but EMS reportedly gave her a neb treatment. In the ER, by the time she was seen by the EDP, her dyspnea and chest pain had resolved. EKG changes were noted, however with ST depressions which resolved on a repeat EKG. She currently is asymptomatic.  Her family also states that she had dementia and mild confusion at baseline which has been worse over the past few days with visual and auditory hallucinations. She had been talking to people that are not there. No significant agitation noted. She has hallucinated in the past when she has a UTI.  The patient also has had a congested cough and wheezing recently. She has chronic dyspnea with mild/moderate ambulation and has COPD from second hand smoke.    Hospital Course:  Atypical Chest pain-? Transient/very poor  historian  - With non specific EKG changes, cardiac enzymes negative  - Myoview with small area in anterior wall concerning for ischemia-Dr.Croitoru reviewed images, per Dr.Croitoru this is not compelling for a true abnormality. If it is present, the defect is limited in size and mild in severity and recommended to continue with medical therapy for now. - cont ASA and Plavix   Dementia  -stable  -stable, B12 and RPR level normal   Hypothyroidism  - TSH and Free T4 normal   Essential hypertension  - cont Verapamil and diuretics   COPD exacerbation/Pulm fibrosis  -improving, treated with IV solumedrol, improved -stop Solumedrol 60mg  q12, pred taper for 3-4days -also used albuterol nebs  Hypokalemia  - replaced   DM type 2 (diabetes mellitus, type 2)  - resumed Metformin      Procedures:  Myoview   Consultations:  Cards  Discharge Exam: Filed Vitals:   11/06/13 1057  BP: 140/71  Pulse: 88  Temp:   Resp:     General: AAOx3 Cardiovascular: S1S2/RRR Respiratory: CTAB  Discharge Instructions You were cared for by a hospitalist during your hospital stay. If you have any questions about your discharge medications or the care you received while you were in the hospital after you are discharged, you can call the unit and asked to speak with the hospitalist on call if the hospitalist that took care of you is not available. Once you are discharged, your primary care physician will handle  any further medical issues. Please note that NO REFILLS for any discharge medications will be authorized once you are discharged, as it is imperative that you return to your primary care physician (or establish a relationship with a primary care physician if you do not have one) for your aftercare needs so that they can reassess your need for medications and monitor your lab values.  Discharge Instructions   Diet - low sodium heart healthy    Complete by:  As directed      Increase activity  slowly    Complete by:  As directed           Current Discharge Medication List    CONTINUE these medications which have CHANGED   Details  !! predniSONE (DELTASONE) 20 MG tablet Take 2 tablets (40 mg total) by mouth daily with breakfast. Take 40mg  for 1day then 20mg  for 2days then 10mg  for 2days then back to basal dose of 5mg  Qty: 6 tablet, Refills: 0    !! predniSONE (DELTASONE) 5 MG tablet Take 1 tablet (5 mg total) by mouth daily with breakfast.     !! - Potential duplicate medications found. Please discuss with provider.    CONTINUE these medications which have NOT CHANGED   Details  Ascorbic Acid (VITAMIN C) 1000 MG tablet Take 1,000 mg by mouth daily.    aspirin 81 MG EC tablet Take 81 mg by mouth every Monday, Wednesday, and Friday.     benazepril-hydrochlorthiazide (LOTENSIN HCT) 20-12.5 MG per tablet Take 1 tablet by mouth daily. Qty: 90 tablet, Refills: 3    Cholecalciferol (VITAMIN D) 2000 UNITS CAPS Take 2,000 Units by mouth every morning.    citalopram (CELEXA) 20 MG tablet Take 20 mg by mouth every morning.     clopidogrel (PLAVIX) 75 MG tablet Take 75 mg by mouth See admin instructions. Take 75 mg by mouth every Tuesday, Thursday and saturday    Flaxseed, Linseed, (FLAXSEED OIL) 1000 MG CAPS Take 1,000 mg by mouth every morning.     levothyroxine (SYNTHROID, LEVOTHROID) 75 MCG tablet take 1 tablet by mouth once daily Qty: 90 tablet, Refills: PRN    loratadine (CLARITIN) 10 MG tablet Take 10 mg by mouth every morning.     magnesium gluconate (MAGONATE) 500 MG tablet Take 500 mg by mouth daily.    metFORMIN (GLUCOPHAGE-XR) 500 MG 24 hr tablet Take 500-1,000 mg by mouth daily.     Multiple Vitamins-Minerals (CENTRUM SILVER PO) Take 1 tablet by mouth daily.     Probiotic Product (RESTORA PO) Take 1 tablet by mouth daily.     QUEtiapine (SEROQUEL) 50 MG tablet Take 75 mg by mouth at bedtime.     verapamil (CALAN-SR) 120 MG CR tablet Take 120 mg by mouth  every morning.       Allergies  Allergen Reactions  . Iron     GI  . Meloxicam Other (See Comments)    Abd pain  . Meperidine Hcl Other (See Comments)    agitated  . Pravastatin     myalgias  . Zolpidem Tartrate Other (See Comments)    confusion  . Codeine Palpitations    "heart raced"   Follow-up Information   Follow up with Alesia Richards, MD. Schedule an appointment as soon as possible for a visit in 1 week.   Specialty:  Internal Medicine   Contact information:   [address] [place] [address] [phone]        The results  of significant diagnostics from this hospitalization (including imaging, microbiology, ancillary and laboratory) are listed below for reference.    Significant Diagnostic Studies: Ct Head Wo Contrast  11/04/2013   CLINICAL DATA:  Confusion. Progressive shortness of Breath over last 5 days.  EXAM: CT HEAD WITHOUT CONTRAST  TECHNIQUE: Contiguous axial images were obtained from the base of the skull through the vertex without intravenous contrast.  COMPARISON:  MR 08/15/2010  FINDINGS: Prominence of the sulci and ventricles noted compatible with brain atrophy. Diffuse low attenuation throughout the subcortical and periventricular white matter is identified compatible with chronic small vessel ischemic disease. The paranasal sinuses are clear. The mastoid air cells are clear. The calvarium is intact.  IMPRESSION: 1. No acute intracranial abnormalities. 2. Small vessel ischemic disease and brain atrophy.   Electronically Signed   By: Kerby Moors M.D.   On: 11/04/2013 16:00   Nm Myocar Multi W/spect W/wall Motion / Ef  11/05/2013   CLINICAL DATA:  Chest pain  EXAM: MYOCARDIAL IMAGING WITH SPECT (REST AND PHARMACOLOGIC-STRESS)  GATED LEFT VENTRICULAR WALL MOTION STUDY  LEFT VENTRICULAR EJECTION FRACTION  TECHNIQUE: Standard myocardial SPECT imaging was performed after resting intravenous injection of 10 mCi Tc-81m sestamibi.  Subsequently, intravenous infusion of Lexiscan was performed under the supervision of the Cardiology staff. At peak effect of the drug, 30 mCi Tc-50m sestamibi was injected intravenously and standard myocardial SPECT imaging was performed. Quantitative gated imaging was also performed to evaluate left ventricular wall motion, and estimate left ventricular ejection fraction.  COMPARISON:  None.  FINDINGS: Baseline EKG:  NSR with no ST changes  EKG during Lexiscan infusion: NSR with wandering baseline artifact. Occasional PVC's. No acute ST changes. No chest pain or SOB  Raw images demonstrate increased gut uptake below the diaphragm and mild diaphragmatic attenuation.  Perfusion: Small in size, mild in intensity defect in the anterior wall worrisome for a subtle area of ischemia.  Wall Motion: Normal left ventricular wall motion. No left ventricular dilation.  Left Ventricular Ejection Fraction: 73 %  End diastolic volume 50 ml  End systolic volume 13 ml  IMPRESSION: 1. Subtle area of decreased uptake in the anterior wall worrisome for a small area or ischemia.  2. Normal left ventricular wall motion.  3. Left ventricular ejection fraction 73%  4. Intermediate-risk stress test findings*.  *2012 Appropriate Use Criteria for Coronary Revascularization Focused Update: J Am Coll Cardiol. 9470;96(2):836-629. http://content.airportbarriers.com.aspx?articleid=1201161   Electronically Signed   By: Fransico Him   On: 11/05/2013 17:04   Dg Chest Portable 1 View  11/04/2013   CLINICAL DATA:  Shortness of breath.  COPD.  Weakness.  Diabetes.  EXAM: PORTABLE CHEST - 1 VIEW  COMPARISON:  07/07/2013  FINDINGS: Mild cardiomegaly stable. Both lungs are clear. Mild hyperinflation again noted. No evidence of pleural effusion. No mass or lymphadenopathy identified.  IMPRESSION: Stable mild cardiomegaly and probable COPD.  No active disease.   Electronically Signed   By: Earle Gell M.D.   On: 11/04/2013 13:00     Microbiology: No results found for this or any previous visit (from the past 240 hour(s)).   Labs: Basic Metabolic Panel:  Recent Labs Lab 11/04/13 1216 11/05/13 0253  NA 141 139  K 2.8* 4.6  CL 101 103  CO2 25 24  GLUCOSE 141* 154*  BUN 21 24*  CREATININE 0.93 0.83  CALCIUM 9.0 8.8  MG  --  1.9   Liver Function Tests: No results found for this basename: AST, ALT,  ALKPHOS, BILITOT, PROT, ALBUMIN,  in the last 168 hours No results found for this basename: LIPASE, AMYLASE,  in the last 168 hours No results found for this basename: AMMONIA,  in the last 168 hours CBC:  Recent Labs Lab 11/04/13 1216 11/05/13 0253  WBC 3.5* 3.8*  NEUTROABS 2.5  --   HGB 11.5* 10.8*  HCT 34.0* 31.8*  MCV 90.4 88.8  PLT 167 199   Cardiac Enzymes:  Recent Labs Lab 11/04/13 2232 11/05/13 0253  TROPONINI <0.30 <0.30   BNP: BNP (last 3 results) No results found for this basename: PROBNP,  in the last 8760 hours CBG:  Recent Labs Lab 11/05/13 1145 11/05/13 1650 11/05/13 1943 11/06/13 0735 11/06/13 1140  GLUCAP 95 149* 143* 202* 171*       Signed:  Payeton Germani  [facility] 11/06/2013, 1:44 PM

## 2013-11-11 ENCOUNTER — Ambulatory Visit (INDEPENDENT_AMBULATORY_CARE_PROVIDER_SITE_OTHER): Payer: Medicare Other | Admitting: Internal Medicine

## 2013-11-11 ENCOUNTER — Encounter: Payer: Self-pay | Admitting: Internal Medicine

## 2013-11-11 VITALS — BP 128/76 | HR 82 | Temp 98.0°F | Resp 16 | Ht 66.0 in | Wt 154.0 lb

## 2013-11-11 DIAGNOSIS — J4531 Mild persistent asthma with (acute) exacerbation: Secondary | ICD-10-CM

## 2013-11-11 MED ORDER — AZITHROMYCIN 250 MG PO TABS
ORAL_TABLET | ORAL | Status: DC
Start: 2013-11-11 — End: 2013-11-27

## 2013-11-11 MED ORDER — PREDNISONE 10 MG PO TABS: ORAL_TABLET | ORAL | Status: DC

## 2013-11-11 MED ORDER — IPRATROPIUM-ALBUTEROL 0.5-2.5 (3) MG/3ML IN SOLN
3.0000 mL | Freq: Once | RESPIRATORY_TRACT | Status: AC
  Administered 2013-11-11: 3 mL via RESPIRATORY_TRACT

## 2013-11-11 MED ORDER — IPRATROPIUM-ALBUTEROL 0.5-2.5 (3) MG/3ML IN SOLN: 3.0000 mL | RESPIRATORY_TRACT | Status: DC

## 2013-11-11 NOTE — Progress Notes (Signed)
   Subjective:    Patient ID: [name] [name], female    DOB: April 02, 1929, 80 y.o.   MRN: [id number]  HPI 80 yo WWF recently hospitalized Oct 7-9 with hallucinations and suspected exacerbation of asthma for which she was treated and d/c'd w/prednisone taper. No Abx's were given. Patient did have a Neg CTs Head and Neg Stress Myoview in the hospital. Because of increased wheezing, she was brought in by the family.     Other problems include, HTN, T2DM, COPD/Asthma, Dementia with hx/o hallucinations. Meds/Allergies/PMH- all reviewed  Review of Systems Resp Sx's, include congested cough & wheezing. Otherwise systems review neg.  Objective:   Physical Exam BP 128/76  Pulse 82  Temp(Src) 98 F (36.7 C) (Temporal)  Resp 16  Ht 5\' 6"  (1.676 m)  Wt 154 lb (69.854 kg)  BMI 24.87 kg/m2  Course congested wheezy cough. O2 sat 96% at rest dropped to 90% walking short distances and recovered quickly back to 96-97%. HEENT - Eac's patent. TM's Nl. EOM's full. PERRLA. NasoOroPharynx clear. Neck - supple. Nl Thyroid. Carotids 2+ & No bruits, nodes, JVD Chest - Congested bilat Rales, rhonchi and wheezes. Cor - Nl HS. RRR w/o sig MGR. No edema. MS- FROM w/o deformities. Muscle power, tone and bulk decreased. Gait Nl. Neuro - No obvious Cr N abnormalities. Sensory, motor and Cerebellar functions appear Nl w/o focal abnormalities. Psyche - Mental status with flat affect.  Poor insight.No delusions, ideations or hallucinations at present.  * Pt was given a Duoneb Treatment with marked improvement on recheck  Assessment & Plan:   1. Asthmatic bronchitis, mild persistent, with acute exacerbation  - predniSONE (DELTASONE) 10 MG tablet; 1 tab 3 x day for 5 days, then 1 tab 2 x day for 5days, then 1 tab 1 x day for 5 days- then as directed  Dispense: 100 tablet; Refill: 0 - azithromycin (ZITHROMAX) 250 MG tablet; Take 2 tablets (500 mg) on  Day 1,  followed by 1 tablet (250 mg) once daily on Days 2 through 5.   Dispense: 6 each; Refill: 1 - ipratropium-albuterol (DUONEB) 0.5-2.5 (3) MG/3ML nebulizer solution 3 mL; Take 3 mLs by nebulization once.  ROV 2 weeks for recheck.

## 2013-11-25 ENCOUNTER — Ambulatory Visit: Payer: Self-pay | Admitting: Internal Medicine

## 2013-11-27 ENCOUNTER — Ambulatory Visit (INDEPENDENT_AMBULATORY_CARE_PROVIDER_SITE_OTHER): Payer: Medicare Other | Admitting: Internal Medicine

## 2013-11-27 ENCOUNTER — Encounter: Payer: Self-pay | Admitting: Internal Medicine

## 2013-11-27 VITALS — BP 126/74 | HR 80 | Temp 97.7°F | Resp 16 | Ht 66.0 in | Wt 163.8 lb

## 2013-11-27 DIAGNOSIS — E559 Vitamin D deficiency, unspecified: Secondary | ICD-10-CM

## 2013-11-27 DIAGNOSIS — J449 Chronic obstructive pulmonary disease, unspecified: Secondary | ICD-10-CM

## 2013-11-27 DIAGNOSIS — J45901 Unspecified asthma with (acute) exacerbation: Secondary | ICD-10-CM | POA: Insufficient documentation

## 2013-11-27 DIAGNOSIS — Z0001 Encounter for general adult medical examination with abnormal findings: Secondary | ICD-10-CM

## 2013-11-27 DIAGNOSIS — J4521 Mild intermittent asthma with (acute) exacerbation: Secondary | ICD-10-CM

## 2013-11-27 DIAGNOSIS — I10 Essential (primary) hypertension: Secondary | ICD-10-CM

## 2013-11-27 DIAGNOSIS — E1122 Type 2 diabetes mellitus with diabetic chronic kidney disease: Secondary | ICD-10-CM

## 2013-11-27 DIAGNOSIS — R6889 Other general symptoms and signs: Secondary | ICD-10-CM

## 2013-11-27 DIAGNOSIS — E039 Hypothyroidism, unspecified: Secondary | ICD-10-CM

## 2013-11-27 DIAGNOSIS — E782 Mixed hyperlipidemia: Secondary | ICD-10-CM

## 2013-11-27 DIAGNOSIS — F0391 Unspecified dementia with behavioral disturbance: Secondary | ICD-10-CM

## 2013-11-27 DIAGNOSIS — E1129 Type 2 diabetes mellitus with other diabetic kidney complication: Secondary | ICD-10-CM | POA: Insufficient documentation

## 2013-11-27 NOTE — Progress Notes (Signed)
Patient ID: [name] [name], female   DOB: 16-May-1929, 80 y.o.   MRN: [id number]  MEDICARE ANNUAL WELLNESS VISIT AND f/u OV  Assessment:   1. COPD with chronic bronchitis   2. Asthma with exacerbation, mild intermittent   3. Encounter for general adult medical examination with abnormal findings   4. Hypertension   5. Hyperlipidemia   6. T2_NIDDM w/CKD3   7. Vitamin D deficiency   8. SDAT with behavioral disturbance   9. Hypothyroidism  Plan:   During the course of the visit the patient was educated and counseled about appropriate screening and preventive services including:    Pneumococcal vaccine   Influenza vaccine  Td vaccine  Screening electrocardiogram  Bone densitometry screening  Colorectal cancer screening  Diabetes screening  Glaucoma screening  Nutrition counseling   Advanced directives: requested  Screening recommendations, referrals: Vaccinations: DT vaccine 02/02/2012 Influenza vaccine scheduled for next week Pneumococcal vaccine 02/01/2009 Prevnar vaccine Recommended pending restocking Shingles vaccine undecided Hep B vaccine not indicated  Nutrition assessed and recommended  Colonoscopy Yes Recommended yearly ophthalmology/optometry visit for glaucoma screening and checkup Recommended yearly dental visit for hygiene and checkup Advanced directives - Yes  Conditions/risks identified: BMI: Discussed weight loss, diet, and increase physical activity.  Increase physical activity: AHA recommends 150 minutes of physical activity a week.  Medications reviewed Diabetes is at goal, ACE therapy: Yes. Urinary Incontinence is not an issue: discussed non pharmacology and pharmacology options.  Fall risk: low- discussed PT, home fall assessment, medications.   Subjective:   [name] [name] is a 80 y.o.WWF who presents for Medicare Annual Wellness Visit and 2 week f/u OV to recheck after recent treatment of an AECB.    Date of last medicare  wellness visit is unknown.  She has had elevated blood pressure since 1992. Her blood pressure has been controlled at home, & today their BP is BP: 126/74 mmHg  Patient has COPD and recently was treated with a Z pak and prednisone with more frequent HHN tx's.  She does not workout. She denies chest pain, shortness of breath, dizziness.  She is not on cholesterol medication and denies myalgias. Her cholesterol is not at goal. Treatment is deferred due to patient's severe Dementia. Patient is reported by family to be demonstrating more hallucinations, delusions, paranoia and occasional aggressive argumentative. The cholesterol last visit was:  Lab Results  Component Value Date   CHOL 244* 06/25/2013   HDL 80 06/25/2013   LDLCALC 143* 06/25/2013   TRIG 107 06/25/2013   CHOLHDL 3.1 06/25/2013   She has had diabetes for 14 years (initial Dx 2001 & controlled by diet til treatment started in 2012). She has not been working on diet and exercise for diabetes, and denies foot ulcerations, hyperglycemia, hypoglycemia , paresthesia of the feet and visual disturbances. Last A1C in the office was:  Lab Results  Component Value Date   HGBA1C 6.4* 06/25/2013   Patient is on Vitamin D supplement.   Lab Results  Component Value Date   VD25OH 95* 06/25/2013     Names of Other Physician/Practitioners you currently use: 1. [facility] here for primary care 2. Dr Delman Cheadle, eye doctor, last visit 2001 3. No dentist, has dentures  Patient Care Team: Unk Pinto, MD as PCP - General (Internal Medicine)  Medication Review: Medication Sig  . Ascorbic Acid (VITAMIN C) 1000 MG tablet Take 1,000 mg by mouth daily.  Marland Kitchen aspirin 81 MG EC tablet Take 81 mg  by mouth every Monday, Wednesday, and Friday.   . benazepril-hydrochlorthiazide (LOTENSIN HCT) 20-12.5 MG per tablet Take 1 tablet by mouth daily.  . Cholecalciferol (VITAMIN D) 2000 UNITS CAPS Take 2,000 Units by mouth every  morning.  . citalopram (CELEXA) 20 MG tablet Take 20 mg by mouth every morning.   . clopidogrel (PLAVIX) 75 MG tablet Take 75 mg by mouth See admin instructions. Take 75 mg by mouth every Tuesday, Thursday and saturday  . Flaxseed, Linseed, (FLAXSEED OIL) 1000 MG CAPS Take 1,000 mg by mouth every morning.   Marland Kitchen levothyroxine (SYNTHROID, LEVOTHROID) 75 MCG tablet take 1 tablet by mouth once daily  . loratadine (CLARITIN) 10 MG tablet Take 10 mg by mouth every morning.   . magnesium gluconate (MAGONATE) 500 MG tablet Take 500 mg by mouth daily.  . metFORMIN (GLUCOPHAGE-XR) 500 MG 24 hr tablet Take 500-1,000 mg by mouth daily.   . Multiple Vitamins-Minerals (CENTRUM SILVER PO) Take 1 tablet by mouth daily.   . Probiotic Product (RESTORA PO) Take 1 tablet by mouth daily.   . QUEtiapine (SEROQUEL) 50 MG tablet Take 75 mg by mouth at bedtime.   . verapamil (CALAN-SR) 120 MG CR tablet Take 120 mg by mouth every morning.   Current Problems (verified) Patient Active Problem List   Diagnosis Date Noted  . T2_NIDDM w/Stage 3 CKD (GFR 54 ml/min) 11/27/2013  . COPD with chronic bronchitis 11/27/2013  . Asthma with exacerbation 11/27/2013  . Encounter for long-term (current) use of other medications 06/25/2013  . OBS 05/22/2013  . Vitamin D deficiency   . Dementia   . Depression   . Postinflammatory pulmonary fibrosis 04/14/2007  . Hypothyroidism 01/24/2007  . Mixed hyperlipidemia 01/24/2007  . Essential hypertension 01/24/2007  . ASTHMA 01/24/2007  . GERD 01/24/2007  . Irritable bowel syndrome 01/24/2007   Screening Tests Health Maintenance  Topic Date Due  . Foot Exam  03/20/1941  . Ophthalmology Exam  03/20/1941  . Zostavax  03/21/1991  . Influenza Vaccine  08/29/2013  . Hemoglobin A1c  12/26/2013  . Urine Microalbumin  06/26/2014  . Colonoscopy  01/02/2017  . Tetanus/tdap  02/01/2022  . Pneumococcal Polysaccharide Vaccine Age 4 And Over  Completed   Immunization History   Administered Date(s) Administered  . Influenza-Unspecified 11/26/2012  . Pneumococcal-Unspecified 02/01/2009  . Td 02/02/2012   Preventative care: Last colonoscopy: 2008  Prior vaccinations: TD : 02/02/2012  Influenza: scheduled next week  Pneumococcal: 02/01/2009 Prevnar: deferred until restocked Shingles/Zostavax: recommended  History reviewed: allergies, current medications, past family history, past medical history, past social history, past surgical history and problem list  Risk Factors: Following reported by family Tobacco History  Substance Use Topics  . Smoking status: Never Smoker   . Smokeless tobacco: Never Used  . Alcohol Use: No   She does not smoke.  Patient is not a former smoker. Are there smokers in your home (other than you)?  No  Alcohol Current alcohol use: none  Caffeine Current caffeine use: denies use  Exercise Current exercise: none  Nutrition/Diet Current diet: in general, an "unhealthy" diet  Cardiac risk factors: advanced age (older than 46 for men, 93 for women), diabetes mellitus, dyslipidemia, family history of premature cardiovascular disease, hypertension and sedentary lifestyle.  Depression Screen - questions unanswered as patient cannot understand questions. (Note: if answer to either of the following is "Yes", a more complete depression screening is indicated)   Q1: Over the past two weeks, have you felt down, depressed or hopeless?  Q2: Over the past two weeks, have you felt little interest or pleasure in doing things?  Have you lost interest or pleasure in daily life?  Do you often feel hopeless?  Do you cry easily over simple problems?   Activities of Daily Living In your present state of health, do you have any difficulty performing the following activities?:  Driving? Unable to drive Managing money? yes Feeding yourself? no Getting from bed to chair? No Climbing a flight of stairs? No Preparing food and eating?:  yes Bathing or showering? Needs assistance Getting dressed: No Getting to the toilet? No Using the toilet:No Moving around from place to place: yes In the past year have you fallen or had a near fall?:No   Are you sexually active?  No  Do you have more than one partner?  No  Vision Difficulties: No  Hearing Difficulties: No Do you often ask people to speak up or repeat themselves? No Do you experience ringing or noises in your ears? No Do you have difficulty understanding soft or whispered voices? Sometimes.  Cognition  Patient has delusions and hallucinations and frequently carries on conversations with imaginary people.  Do you feel that you have a problem with memory?yes  Do you often misplace items? yes  Do you feel safe at home?  Yes  Advanced directives Does patient have a Celeste? Yes Does patient have a Living Will? Yes   Objective:     BP 126/74, pulse 80, temp 97.7 F (36.5 C), resp 16, ht 5\' 6"  , wt 163 lb 12.8 oz  BMI 26.45  General appearance: alert, no distress, WD/WN, female Cognitive Testing  Alert? Yes  Normal Appearance?Yes  Oriented to person? no    Place? Yes   Time? no  Recall of three objects? no  Can perform simple calculations? no  Displays appropriate judgment? no  Can read the correct time from a watch/clock? no  HEENT: normocephalic, sclerae anicteric, TMs pearly, nares patent, no discharge or erythema, pharynx normal Oral cavity: MMM, no lesions Neck: supple, no lymphadenopathy, no thyromegaly, no masses Heart: RRR, normal S1, S2, no murmurs Lungs: Clear with no wheezes, rhonchi, or rales Abdomen: Soft, non tender, non distended, no masses, no hepatomegaly, no splenomegaly Musculoskeletal: nontender, no swelling, no obvious deformity Extremities: no edema, no cyanosis, no clubbing Pulses: 2+ symmetric, upper and lower extremities, normal cap refill Neurological: alert, oriented x1, CN2-12 intact, strength normal  upper extremities and lower extremities, sensation normal throughout, DTRs 2+ throughout, no cerebellar signs, gait normal Psychiatric: Flat affect, behavior normal, confabulatory. No abstractions.  Medicare Attestation I have personally reviewed: The patient's medical and social history Their use of alcohol, tobacco or illicit drugs Their current medications and supplements The patient's functional ability including ADLs,fall risks, home safety risks, cognitive, and hearing and visual impairment Diet and physical activities Evidence for depression or mood disorders   The patient's weight, height, BMI, and visual acuity have been recorded in the chart.  I have made referrals, counseling, and provided education to the patient based on review of the above and I have provided the patient with a written personalized care plan for preventive services.     Patient's respiratory status is recovered from last OV and plann is to continue current meds  Rashay Barnette DAVID, MD   11/27/2013

## 2013-12-30 ENCOUNTER — Other Ambulatory Visit: Payer: Self-pay | Admitting: *Deleted

## 2014-01-01 ENCOUNTER — Ambulatory Visit (INDEPENDENT_AMBULATORY_CARE_PROVIDER_SITE_OTHER): Payer: Medicare Other | Admitting: Internal Medicine

## 2014-01-01 VITALS — BP 138/74 | HR 76 | Temp 97.5°F | Resp 18 | Ht 66.0 in | Wt 164.0 lb

## 2014-01-01 DIAGNOSIS — J45901 Unspecified asthma with (acute) exacerbation: Secondary | ICD-10-CM

## 2014-01-01 DIAGNOSIS — J449 Chronic obstructive pulmonary disease, unspecified: Secondary | ICD-10-CM

## 2014-01-10 ENCOUNTER — Encounter: Payer: Self-pay | Admitting: Internal Medicine

## 2014-01-10 NOTE — Progress Notes (Signed)
Subjective:    Patient ID: [name] [name], female    DOB: [birth year], 80 y.o.   MRN: [id number]  HPI   His was a 1 month post hospital f/u after exacerbation of acute bronchitis and patient was seen 2 weeks ago and treated with a Z pak and prednisone taper and advised to use her neb's on a regular 3-4 x/ da basis until her current sx's stabilized. Caretaker  (neice) reporyts she is somewhat better w/o any current respiratory sx's. She was noted to have an incidental large skin tear of her dorsal Rt wrist.   Medication Sig  . Ascorbic Acid (VITAMIN C) 1000 MG tablet Take 1,000 mg by mouth daily.  Marland Kitchen aspirin 81 MG EC tablet Take 81 mg by mouth every Monday, Wednesday, and Friday.   . benazepril-hydrochlorthiazide (LOTENSIN HCT) 20-12.5 MG per tablet Take 1 tablet by mouth daily.  . Cholecalciferol (VITAMIN D) 2000 UNITS CAPS Take 2,000 Units by mouth 2 (two) times daily.   . citalopram (CELEXA) 40 MG tablet Take 40 mg by mouth daily.  . clopidogrel (PLAVIX) 75 MG tablet Take 75 mg by mouth See admin instructions. Take 75 mg by mouth every Tuesday, Thursday and saturday  . levothyroxine (SYNTHROID, LEVOTHROID) 75 MCG tablet take 1 tablet by mouth once daily  . loratadine (CLARITIN) 10 MG tablet Take 10 mg by mouth every morning.   . magnesium gluconate (MAGONATE) 500 MG tablet Take 500 mg by mouth daily.  . metFORMIN (GLUCOPHAGE-XR) 500 MG 24 hr tablet Take 500-1,000 mg by mouth daily.   . Multiple Vitamins-Minerals (CENTRUM SILVER PO) Take 1 tablet by mouth daily.   . Omega-3 Fatty Acids (FISH OIL PO) Take 1,000 mg by mouth every other day.  . Probiotic Product (RESTORA PO) Take 1 tablet by mouth daily.   . QUEtiapine (SEROQUEL) 50 MG tablet Take 75 mg by mouth at bedtime.   . verapamil (CALAN-SR) 120 MG CR tablet Take 120 mg by mouth every morning.   Allergies  Allergen Reactions  . Iron     GI  . Meloxicam Other (See Comments)    Abd pain  . Meperidine Hcl Other (See Comments)   agitated  . Pravastatin     myalgias  . Zolpidem Tartrate Other (See Comments)    confusion  . Codeine Palpitations    "heart raced"   Past Medical History  Diagnosis Date  . Asthma   . Type II or unspecified type diabetes mellitus without mention of complication, not stated as uncontrolled   . Hyperlipidemia   . Chronic headaches   . Hypertension   . Pulmonary fibrosis     Chronic granulomatous changes on CT chest, minimal mediastinal LAN 3/09  . COPD (chronic obstructive pulmonary disease)   . Vitamin D deficiency   . B12 deficiency   . Allergy   . Dementia   . Depression   . Hypothyroidism   . CKD (chronic kidney disease) stage 3, GFR 30-59 ml/min    Review of Systems    In addition to the HPI above,  No Fever-chills,  No Headache, No changes with Vision or hearing,  No problems swallowing food or Liquids,  No Chest pain or Shortness of Breath,  No Abdominal pain, No Nausea or Vomitting, Bowel movements are regular,  No Blood in stool or Urine,  No dysuria,  No new skin rashes or bruises,  No new joints pains-aches,  No new weakness, tingling, numbness in any extremity,  No recent  weight loss,  No polyuria, polydypsia or polyphagia,  No significant Mental Stressors.  A full 10 point Review of Systems was done, except as stated above, all other Review of Systems were negative    Objective:   Physical Exam       BP 138/74   Pulse 76  Temp 97.5 F   Resp 18  Ht 5\' 6"    Wt 164 lb   BMI 26.48  HEENT - Eac's patent. TM's Nl. EOM's full. PERRLA. NasoOroPharynx clear. Neck - supple. Nl Thyroid. Carotids 2+ & No bruits, nodes, JVD Chest - Clear distant & equal BS w/o Rales, rhonchi, wheezes. Cor - Nl HS. RRR w/o sig MGR. PP 1(+). No edema. Abd - No palpable organomegaly, masses or tenderness. BS nl. MS- FROM w/o deformities. Muscle power, tone and bulk Nl. Gait Nl. Neuro - No obvious Cr N abnormalities. Sensory, motor and Cerebellar functions appear Nl w/o focal  abnormalities. Psyche - Decreased ST recall. Concrete abstractions.  No delusions, ideations or obvious mood abnormalities. Skin - 2" v-shaped flap type skin tear of dorsal  Rt wrist - cleansed with H2O2 and painted with betadine and Tegaderm applied (family member/caretaker instructed in skin care)     Assessment & Plan:   1. Asthma with exacerbation, resolved   2. COPD with chronic bronchitis   F/U routine as previous

## 2014-01-19 NOTE — Telephone Encounter (Signed)
ERROR

## 2014-01-26 ENCOUNTER — Other Ambulatory Visit: Payer: Self-pay | Admitting: Internal Medicine

## 2014-01-26 MED ORDER — QUETIAPINE FUMARATE 25 MG PO TABS: ORAL_TABLET | ORAL | Status: DC

## 2014-02-02 ENCOUNTER — Telehealth: Payer: Self-pay | Admitting: Internal Medicine

## 2014-02-02 NOTE — Telephone Encounter (Signed)
[facility] does not accept patient's insurance.

## 2014-02-22 ENCOUNTER — Emergency Department (HOSPITAL_COMMUNITY)
Admission: EM | Admit: 2014-02-22 | Discharge: 2014-02-22 | Disposition: A | Discharge status: Home / Self Care | Payer: Medicare Other | Attending: Emergency Medicine | Admitting: Emergency Medicine

## 2014-02-22 ENCOUNTER — Emergency Department (HOSPITAL_COMMUNITY): Payer: Medicare Other

## 2014-02-22 ENCOUNTER — Encounter (HOSPITAL_COMMUNITY): Payer: Self-pay

## 2014-02-22 DIAGNOSIS — F329 Major depressive disorder, single episode, unspecified: Secondary | ICD-10-CM | POA: Diagnosis not present

## 2014-02-22 DIAGNOSIS — S79911A Unspecified injury of right hip, initial encounter: Secondary | ICD-10-CM | POA: Diagnosis not present

## 2014-02-22 DIAGNOSIS — W1830XA Fall on same level, unspecified, initial encounter: Secondary | ICD-10-CM | POA: Insufficient documentation

## 2014-02-22 DIAGNOSIS — R42 Dizziness and giddiness: Secondary | ICD-10-CM | POA: Diagnosis not present

## 2014-02-22 DIAGNOSIS — R1030 Lower abdominal pain, unspecified: Secondary | ICD-10-CM | POA: Diagnosis not present

## 2014-02-22 DIAGNOSIS — R410 Disorientation, unspecified: Secondary | ICD-10-CM | POA: Diagnosis not present

## 2014-02-22 DIAGNOSIS — Y998 Other external cause status: Secondary | ICD-10-CM | POA: Insufficient documentation

## 2014-02-22 DIAGNOSIS — S3991XA Unspecified injury of abdomen, initial encounter: Secondary | ICD-10-CM | POA: Insufficient documentation

## 2014-02-22 DIAGNOSIS — N261 Atrophy of kidney (terminal): Secondary | ICD-10-CM | POA: Diagnosis not present

## 2014-02-22 DIAGNOSIS — J449 Chronic obstructive pulmonary disease, unspecified: Secondary | ICD-10-CM | POA: Insufficient documentation

## 2014-02-22 DIAGNOSIS — I129 Hypertensive chronic kidney disease with stage 1 through stage 4 chronic kidney disease, or unspecified chronic kidney disease: Secondary | ICD-10-CM | POA: Insufficient documentation

## 2014-02-22 DIAGNOSIS — R6889 Other general symptoms and signs: Secondary | ICD-10-CM | POA: Diagnosis not present

## 2014-02-22 DIAGNOSIS — F0390 Unspecified dementia without behavioral disturbance: Secondary | ICD-10-CM | POA: Insufficient documentation

## 2014-02-22 DIAGNOSIS — E039 Hypothyroidism, unspecified: Secondary | ICD-10-CM | POA: Insufficient documentation

## 2014-02-22 DIAGNOSIS — Z7902 Long term (current) use of antithrombotics/antiplatelets: Secondary | ICD-10-CM | POA: Diagnosis not present

## 2014-02-22 DIAGNOSIS — E559 Vitamin D deficiency, unspecified: Secondary | ICD-10-CM | POA: Diagnosis not present

## 2014-02-22 DIAGNOSIS — Z9089 Acquired absence of other organs: Secondary | ICD-10-CM | POA: Insufficient documentation

## 2014-02-22 DIAGNOSIS — R404 Transient alteration of awareness: Secondary | ICD-10-CM | POA: Diagnosis not present

## 2014-02-22 DIAGNOSIS — R109 Unspecified abdominal pain: Secondary | ICD-10-CM

## 2014-02-22 DIAGNOSIS — Z79899 Other long term (current) drug therapy: Secondary | ICD-10-CM | POA: Diagnosis not present

## 2014-02-22 DIAGNOSIS — Z9851 Tubal ligation status: Secondary | ICD-10-CM | POA: Diagnosis not present

## 2014-02-22 DIAGNOSIS — Y9289 Other specified places as the place of occurrence of the external cause: Secondary | ICD-10-CM | POA: Diagnosis not present

## 2014-02-22 DIAGNOSIS — Z7952 Long term (current) use of systemic steroids: Secondary | ICD-10-CM | POA: Insufficient documentation

## 2014-02-22 DIAGNOSIS — E119 Type 2 diabetes mellitus without complications: Secondary | ICD-10-CM | POA: Insufficient documentation

## 2014-02-22 DIAGNOSIS — N183 Chronic kidney disease, stage 3 (moderate): Secondary | ICD-10-CM | POA: Diagnosis not present

## 2014-02-22 DIAGNOSIS — Z7982 Long term (current) use of aspirin: Secondary | ICD-10-CM | POA: Diagnosis not present

## 2014-02-22 DIAGNOSIS — Y9389 Activity, other specified: Secondary | ICD-10-CM | POA: Insufficient documentation

## 2014-02-22 DIAGNOSIS — W19XXXA Unspecified fall, initial encounter: Secondary | ICD-10-CM

## 2014-02-22 LAB — I-STAT CHEM 8, ED
BUN: 25 mg/dL — ABNORMAL HIGH (ref 6–23)
Calcium, Ion: 1.12 mmol/L — ABNORMAL LOW (ref 1.13–1.30)
Chloride: 105 mmol/L (ref 96–112)
Creatinine, Ser: 0.9 mg/dL (ref 0.50–1.10)
GLUCOSE: 151 mg/dL — AB (ref 70–99)
HEMATOCRIT: 39 % (ref 36.0–46.0)
Hemoglobin: 13.3 g/dL (ref 12.0–15.0)
Potassium: 3.7 mmol/L (ref 3.5–5.1)
Sodium: 140 mmol/L (ref 135–145)
TCO2: 23 mmol/L (ref 0–100)

## 2014-02-22 LAB — CBC WITH DIFFERENTIAL/PLATELET
BASOS PCT: 0 % (ref 0–1)
Basophils Absolute: 0 10*3/uL (ref 0.0–0.1)
EOS PCT: 2 % (ref 0–5)
Eosinophils Absolute: 0.2 10*3/uL (ref 0.0–0.7)
HEMATOCRIT: 37.3 % (ref 36.0–46.0)
Hemoglobin: 12.4 g/dL (ref 12.0–15.0)
LYMPHS ABS: 1.2 10*3/uL (ref 0.7–4.0)
Lymphocytes Relative: 15 % (ref 12–46)
MCH: 31.2 pg (ref 26.0–34.0)
MCHC: 33.2 g/dL (ref 30.0–36.0)
MCV: 93.7 fL (ref 78.0–100.0)
MONOS PCT: 11 % (ref 3–12)
Monocytes Absolute: 0.9 10*3/uL (ref 0.1–1.0)
NEUTROS PCT: 72 % (ref 43–77)
Neutro Abs: 5.6 10*3/uL (ref 1.7–7.7)
Platelets: 238 10*3/uL (ref 150–400)
RBC: 3.98 MIL/uL (ref 3.87–5.11)
RDW: 12.1 % (ref 11.5–15.5)
WBC: 7.8 10*3/uL (ref 4.0–10.5)

## 2014-02-22 LAB — URINALYSIS, ROUTINE W REFLEX MICROSCOPIC
Bilirubin Urine: NEGATIVE
Glucose, UA: NEGATIVE mg/dL
HGB URINE DIPSTICK: NEGATIVE
Ketones, ur: NEGATIVE mg/dL
LEUKOCYTES UA: NEGATIVE
Nitrite: NEGATIVE
Protein, ur: NEGATIVE mg/dL
Specific Gravity, Urine: 1.026 (ref 1.005–1.030)
UROBILINOGEN UA: 0.2 mg/dL (ref 0.0–1.0)
pH: 7.5 (ref 5.0–8.0)

## 2014-02-22 LAB — COMPREHENSIVE METABOLIC PANEL
ALBUMIN: 3.7 g/dL (ref 3.5–5.2)
ALK PHOS: 52 U/L (ref 39–117)
ALT: 16 U/L (ref 0–35)
AST: 26 U/L (ref 0–37)
Anion gap: 9 (ref 5–15)
BUN: 22 mg/dL (ref 6–23)
CO2: 27 mmol/L (ref 19–32)
CREATININE: 0.98 mg/dL (ref 0.50–1.10)
Calcium: 9.2 mg/dL (ref 8.4–10.5)
Chloride: 105 mmol/L (ref 96–112)
GFR calc non Af Amer: 52 mL/min — ABNORMAL LOW (ref >90)
GFR, EST AFRICAN AMERICAN: 61 mL/min — AB (ref >90)
GLUCOSE: 152 mg/dL — AB (ref 70–99)
POTASSIUM: 3.8 mmol/L (ref 3.5–5.1)
SODIUM: 141 mmol/L (ref 135–145)
Total Bilirubin: 0.6 mg/dL (ref 0.3–1.2)
Total Protein: 6.1 g/dL (ref 6.0–8.3)

## 2014-02-22 LAB — LIPASE, BLOOD: LIPASE: 22 U/L (ref 11–59)

## 2014-02-22 MED ORDER — IOHEXOL 300 MG/ML  SOLN
80.0000 mL | Freq: Once | INTRAMUSCULAR | Status: AC | PRN
  Administered 2014-02-22: 80 mL via INTRAVENOUS

## 2014-02-22 MED ORDER — SODIUM CHLORIDE 0.9 % IV BOLUS (SEPSIS)
500.0000 mL | Freq: Once | INTRAVENOUS | Status: AC
  Administered 2014-02-22: 500 mL via INTRAVENOUS

## 2014-02-22 MED ORDER — IOHEXOL 300 MG/ML  SOLN: 25.0000 mL | Freq: Once | INTRAMUSCULAR | Status: DC | PRN

## 2014-02-22 MED ORDER — ONDANSETRON HCL 4 MG/2ML IJ SOLN
4.0000 mg | Freq: Once | INTRAMUSCULAR | Status: AC
  Administered 2014-02-22: 4 mg via INTRAVENOUS
  Filled 2014-02-22: qty 2

## 2014-02-22 NOTE — ED Provider Notes (Addendum)
Results for orders placed or performed during the hospital encounter of 02/22/14  CBC with Differential/Platelet  Result Value Ref Range   WBC 7.8 4.0 - 10.5 K/uL   RBC 3.98 3.87 - 5.11 MIL/uL   Hemoglobin 12.4 12.0 - 15.0 g/dL   HCT 37.3 36.0 - 46.0 %   MCV 93.7 78.0 - 100.0 fL   MCH 31.2 26.0 - 34.0 pg   MCHC 33.2 30.0 - 36.0 g/dL   RDW 12.1 11.5 - 15.5 %   Platelets 238 150 - 400 K/uL   Neutrophils Relative % 72 43 - 77 %   Neutro Abs 5.6 1.7 - 7.7 K/uL   Lymphocytes Relative 15 12 - 46 %   Lymphs Abs 1.2 0.7 - 4.0 K/uL   Monocytes Relative 11 3 - 12 %   Monocytes Absolute 0.9 0.1 - 1.0 K/uL   Eosinophils Relative 2 0 - 5 %   Eosinophils Absolute 0.2 0.0 - 0.7 K/uL   Basophils Relative 0 0 - 1 %   Basophils Absolute 0.0 0.0 - 0.1 K/uL  Comprehensive metabolic panel  Result Value Ref Range   Sodium 141 135 - 145 mmol/L   Potassium 3.8 3.5 - 5.1 mmol/L   Chloride 105 96 - 112 mmol/L   CO2 27 19 - 32 mmol/L   Glucose, Bld 152 (H) 70 - 99 mg/dL   BUN 22 6 - 23 mg/dL   Creatinine, Ser 0.98 0.50 - 1.10 mg/dL   Calcium 9.2 8.4 - 10.5 mg/dL   Total Protein 6.1 6.0 - 8.3 g/dL   Albumin 3.7 3.5 - 5.2 g/dL   AST 26 0 - 37 U/L   ALT 16 0 - 35 U/L   Alkaline Phosphatase 52 39 - 117 U/L   Total Bilirubin 0.6 0.3 - 1.2 mg/dL   GFR calc non Af Amer 52 (L) >90 mL/min   GFR calc Af Amer 61 (L) >90 mL/min   Anion gap 9 5 - 15  Lipase, blood  Result Value Ref Range   Lipase 22 11 - 59 U/L  Urinalysis, Routine w reflex microscopic  Result Value Ref Range   Color, Urine YELLOW YELLOW   APPearance HAZY (A) CLEAR   Specific Gravity, Urine 1.026 1.005 - 1.030   pH 7.5 5.0 - 8.0   Glucose, UA NEGATIVE NEGATIVE mg/dL   Hgb urine dipstick NEGATIVE NEGATIVE   Bilirubin Urine NEGATIVE NEGATIVE   Ketones, ur NEGATIVE NEGATIVE mg/dL   Protein, ur NEGATIVE NEGATIVE mg/dL   Urobilinogen, UA 0.2 0.0 - 1.0 mg/dL   Nitrite NEGATIVE NEGATIVE   Leukocytes, UA NEGATIVE NEGATIVE  I-stat chem 8,  ed  Result Value Ref Range   Sodium 140 135 - 145 mmol/L   Potassium 3.7 3.5 - 5.1 mmol/L   Chloride 105 96 - 112 mmol/L   BUN 25 (H) 6 - 23 mg/dL   Creatinine, Ser 0.90 0.50 - 1.10 mg/dL   Glucose, Bld 151 (H) 70 - 99 mg/dL   Calcium, Ion 1.12 (L) 1.13 - 1.30 mmol/L   TCO2 23 0 - 100 mmol/L   Hemoglobin 13.3 12.0 - 15.0 g/dL   HCT 39.0 36.0 - 46.0 %   Ct Head Wo Contrast  02/22/2014   CLINICAL DATA:  81 year old female with confusion. Initial encounter.  EXAM: CT HEAD WITHOUT CONTRAST  TECHNIQUE: Contiguous axial images were obtained from the base of the skull through the vertex without intravenous contrast.  COMPARISON:  Head CT 11/04/2013 and earlier.  FINDINGS:  Stable orbits soft tissues.  No scalp hematoma identified.  No acute osseous abnormality identified. Visualized paranasal sinuses and mastoids are clear.  Calcified atherosclerosis at the skull base. Stable cerebral volume. No ventriculomegaly. No midline shift, mass effect, or evidence of intracranial mass lesion. Patchy and confluent cerebral white matter hypodensity is stable. No evidence of cortically based acute infarction identified. No suspicious intracranial vascular hyperdensity.  IMPRESSION: No acute intracranial abnormality. No acute traumatic injury identified. Chronic nonspecific white matter changes.   Electronically Signed   By: Lars Pinks M.D.   On: 02/22/2014 17:42   Ct Abdomen Pelvis W Contrast  02/22/2014   CLINICAL DATA:  81 year old female with acute abdominal and pelvic pain. Initial encounter.  EXAM: CT ABDOMEN AND PELVIS WITH CONTRAST  TECHNIQUE: Multidetector CT imaging of the abdomen and pelvis was performed using the standard protocol following bolus administration of intravenous contrast.  CONTRAST:  17mL OMNIPAQUE IOHEXOL 300 MG/ML  SOLN  COMPARISON:  08/11/2008 and prior CTs  FINDINGS: Lower chest: Mild cardiomegaly and a stable 5 mm right basilar nodule again noted.  Hepatobiliary: The liver and gallbladder  are unremarkable. There is no evidence of biliary dilatation.  Pancreas: Unremarkable  Spleen: Unremarkable  Adrenals/Urinary Tract: Bilateral renal cortical thinning identified as well as bilateral renal cysts. No other renal abnormalities are identified.  The adrenal glands and bladder are unremarkable.  Stomach/Bowel: Sigmoid colonic diverticulosis noted without diverticulitis. No other bowel abnormalities are identified. There is no evidence of bowel obstruction, pneumoperitoneum or abscess.  Vascular/Lymphatic: Abdominal aortic atherosclerotic calcifications are noted without aneurysm. No enlarged lymph nodes are identified.  Reproductive: The uterus and adnexal regions are unremarkable. A 2.8 x 4 cm oval structure along the right vagina/ labia is unchanged from remote studies.  Other: There is no evidence of free fluid.  Musculoskeletal: No acute or suspicious bony abnormalities are identified. Degenerative changes within the lumbar spine and right SI joint again noted.  IMPRESSION: No evidence of acute abnormality.  Colonic diverticulosis without diverticulitis.  Bilateral renal cortical atrophy.  Unchanged 2.8 x 4 cm oval structure along the right vagina/ labia unchanged from very remote studies. This may represent a complicated cyst.   Electronically Signed   By: Hassan Rowan M.D.   On: 02/22/2014 17:54    Care was taken over from Dr. Reather Converse. Pt presents with an unwitnessed fall. She has a history of dementia but lives at home with close family around. Per the family when they initially got there she was having some spinning sensation and vomiting which they felt was consistent with vertigo. She hasn't reported any of those symptoms in the ED. She's neurologically intact other than the family says she's a little more confused than normal although currently they say that she's back to baseline. She has no focal neurologic deficits. Her labs are unremarkable. The CT scans that were ordered by Dr. Reather Converse are  negative for acute injury. There is no evidence of traumatic head injury. There is no evidence of intra-abdominal pathology or hip fracture. She's been ambulating in the ED and has little bit of an unsteady gait which the says is baseline.  They do feel that she's a little bit weaker than she normally is on ambulation however they feel that it's because she hasn't eaten all day. I gave him the option of having a further observation here in the ED after we give her something to eat and observe her but they are ready to take her home. The patient  wants to go home. I feel that this is appropriate. They have family to stay with her tonight. I advised them to bring her back if she has any worsening symptoms and to have close f/u with her PMD.  PT's lactate was normal, but not crossing over into EPIC.  Malvin Johns, MD 02/22/14 [place], MD 02/22/14 [phone]

## 2014-02-22 NOTE — ED Notes (Signed)
Ambulated patient in hallway. Patient has shuffled gait and unsteady without assist.

## 2014-02-22 NOTE — ED Provider Notes (Signed)
CSN: [id number]     Arrival date & time 02/22/14  1314 History   First MD Initiated Contact with Patient 02/22/14 1355     Chief Complaint  Patient presents with  . Abdominal Pain     (Consider location/radiation/quality/duration/timing/severity/associated sxs/prior Treatment) HPI Comments: 81 year old female with history of high blood pressure, asthma, dementia presents after unwitnessed fall and abdominal pain and nausea. Patient had mild vomiting spitting up yellow bile. Patient is found on the ground conscious with no obvious signs of injury. Patient has dementia worse earlier in the day. Patient has slight worsening of confusion per family. No fevers chills or recent infection. No recent change in medicines were called.  Patient is a 81 y.o. female presenting with abdominal pain. The history is provided by the patient.  Abdominal Pain   Past Medical History  Diagnosis Date  . Asthma   . Type II or unspecified type diabetes mellitus without mention of complication, not stated as uncontrolled   . Hyperlipidemia   . Chronic headaches   . Hypertension   . Pulmonary fibrosis     Chronic granulomatous changes on CT chest, minimal mediastinal LAN 3/09  . COPD (chronic obstructive pulmonary disease)   . Vitamin D deficiency   . B12 deficiency   . Allergy   . Dementia   . Depression   . Hypothyroidism   . CKD (chronic kidney disease) stage 3, GFR 30-59 ml/min    Past Surgical History  Procedure Laterality Date  . Appendectomy    . Thyroidectomy    . Tubal ligation    . Esophagogastroduodenoscopy  2012    Gastritis  . Breast biopsy      right breast/benign  . Endometrial biopsy  1998    negative   Family History  Problem Relation Age of Onset  . Asthma Sister   . Cancer Sister     breast, colon  . Diabetes Mother   . Cancer Mother     breast  . Diabetes Sister   . Cancer Sister 71    breast  . Heart disease Father   . Hypertension Father   . Cancer Brother    leukemia  . Cancer Brother     lung  . Hypertension Brother   . Hyperlipidemia Brother    History  Substance Use Topics  . Smoking status: Never Smoker   . Smokeless tobacco: Never Used  . Alcohol Use: No   OB History    No data available     Review of Systems  Unable to perform ROS: Dementia  Gastrointestinal: Positive for abdominal pain.      Allergies  Iron; Meloxicam; Meperidine hcl; Pravastatin; Zolpidem tartrate; and Codeine  Home Medications   Prior to Admission medications   Medication Sig Start Date End Date Taking? Authorizing Provider  Ascorbic Acid (VITAMIN C) 1000 MG tablet Take 1,000 mg by mouth daily.    Historical Provider, MD  aspirin 81 MG EC tablet Take 81 mg by mouth every Monday, Wednesday, and Friday.     Historical Provider, MD  benazepril-hydrochlorthiazide (LOTENSIN HCT) 20-12.5 MG per tablet Take 1 tablet by mouth daily. 05/21/13   Unk Pinto, MD  Cholecalciferol (VITAMIN D) 2000 UNITS CAPS Take 2,000 Units by mouth 2 (two) times daily.     Historical Provider, MD  citalopram (CELEXA) 40 MG tablet Take 40 mg by mouth daily.    Historical Provider, MD  clopidogrel (PLAVIX) 75 MG tablet Take 75 mg by mouth See admin instructions.  Take 75 mg by mouth every Tuesday, Thursday and saturday    Historical Provider, MD  levothyroxine (SYNTHROID, LEVOTHROID) 75 MCG tablet take 1 tablet by mouth once daily 07/23/13   Unk Pinto, MD  loratadine (CLARITIN) 10 MG tablet Take 10 mg by mouth every morning.     Historical Provider, MD  magnesium gluconate (MAGONATE) 500 MG tablet Take 500 mg by mouth daily.    Historical Provider, MD  metFORMIN (GLUCOPHAGE-XR) 500 MG 24 hr tablet Take 500-1,000 mg by mouth daily.     Historical Provider, MD  Multiple Vitamins-Minerals (CENTRUM SILVER PO) Take 1 tablet by mouth daily.     Historical Provider, MD  Omega-3 Fatty Acids (FISH OIL PO) Take 1,000 mg by mouth every other day.    Historical Provider, MD  predniSONE  (DELTASONE) 5 MG tablet take 1 tablet by mouth three times a day OR as directed by prescriber 02/23/14   Unk Pinto, MD  Probiotic Product (RESTORA PO) Take 1 tablet by mouth daily.     Historical Provider, MD  QUEtiapine (SEROQUEL) 25 MG tablet Take 3 to 4 tablets at bedtime as directed 01/26/14 07/28/14  Unk Pinto, MD  verapamil (CALAN-SR) 120 MG CR tablet Take 120 mg by mouth every morning.    Historical Provider, MD  verapamil (CALAN-SR) 240 MG CR tablet  01/12/14   Historical Provider, MD   BP 117/56 mmHg  Pulse 80  Temp(Src) 97.9 F (36.6 C) (Oral)  Resp 16  SpO2 99% Physical Exam  Constitutional: She is oriented to person, place, and time. She appears well-developed and well-nourished.  HENT:  Head: Normocephalic and atraumatic.  Eyes: Right eye exhibits no discharge. Left eye exhibits no discharge.  Neck: Normal range of motion. Neck supple. No tracheal deviation present.  Cardiovascular: Normal rate and regular rhythm.   Pulmonary/Chest: Effort normal and breath sounds normal.  Abdominal: Soft. She exhibits no distension. There is no tenderness. There is no guarding.  Musculoskeletal: She exhibits tenderness (right flank and right iliac crest mild). She exhibits no edema.  Patient has mild tenderness right lateral hip with external range of motion, no significant pain with flexion of hips bilateral, no knee tenderness or effusion.  Neurological: She is alert and oriented to person, place, and time. GCS eye subscore is 4. GCS verbal subscore is 4. GCS motor subscore is 6.  Extraocular muscle function intact, pupils equal, neck supple nontender. Equal strength bilateral upper and lower extremities without drift. Finger nose intact bilateral. Pleasant dementia on exam.  Skin: Skin is warm. No rash noted.  Psychiatric:  Pleasant dementia  Nursing note and vitals reviewed.   ED Course  Procedures (including critical care time) Labs Review Labs Reviewed  COMPREHENSIVE  METABOLIC PANEL - Abnormal; Notable for the following:    Glucose, Bld 152 (*)    GFR calc non Af Amer 52 (*)    GFR calc Af Amer 61 (*)    All other components within normal limits  URINALYSIS, ROUTINE W REFLEX MICROSCOPIC - Abnormal; Notable for the following:    APPearance HAZY (*)    All other components within normal limits  I-STAT CHEM 8, ED - Abnormal; Notable for the following:    BUN 25 (*)    Glucose, Bld 151 (*)    Calcium, Ion 1.12 (*)    All other components within normal limits  CBC WITH DIFFERENTIAL/PLATELET  LIPASE, BLOOD  I-STAT CG4 LACTIC ACID, ED    Imaging Review No results found.  Date/Time:  Monday February 22 2014 13:26:02 EST Ventricular Rate:  76 PR Interval:  218 QRS Duration: 99 QT Interval:  394 QTC Calculation: 443 R Axis:   -21 Text Interpretation:  Sinus rhythm Borderline prolonged PR interval  Borderline left axis deviation Probable anteroseptal infarct, old ED         MDM   Final diagnoses:  Confusion  Abdominal pain  Fall, initial encounter  Dizziness   Patient presents with mild worsening confusion since unwitnessed fall. With dementia history detailed neurologic exam difficult however family at bedside saying worse than normal. Plan for screening blood work and urinalysis, CT head and patient has tenderness right abdomen with nausea and vomiting for CT abdomen ordered.   Pt care will be signed out to fup CT results and recheck.  Filed Vitals:   02/22/14 1630 02/22/14 1730 02/22/14 1917 02/22/14 1926  BP: 115/90 113/47 156/80 117/56  Pulse: 70 75 96 80  Temp:    97.9 F (36.6 C)  TempSrc:    Oral  Resp: 17 16 17 16   SpO2: 99% 96% 97% 99%       Mariea Clonts, MD 02/27/14 [phone]

## 2014-02-22 NOTE — Discharge Instructions (Signed)

## 2014-02-22 NOTE — ED Notes (Addendum)
Per [facility]: pt. Is from home. Hx of dementia. Pt. Is alert and oriented to self/place/situation. Pt. Family monitors her through security cameras in the home. Pt. Family is uncertain whether she fell or not. Pt. States she did not fall. EMS noted L hip bruise. Pt. Able to bear weight and denies pain to L hip. EMS states pt. Has dry heaves and has spit up some yellow bile. Pt. Received 4 mg zofran IV. EMS noted 1st degree HB on 12 Lead. Pt. Has not eaten today, CBG WDL 127. Pt. States she is having 9/10 discomfort to abd.

## 2014-02-22 NOTE — ED Notes (Signed)
Patient transported to CT 

## 2014-02-23 ENCOUNTER — Other Ambulatory Visit: Payer: Self-pay | Admitting: Internal Medicine

## 2014-02-23 LAB — I-STAT CG4 LACTIC ACID, ED: LACTIC ACID, VENOUS: 1.65 mmol/L (ref 0.5–2.0)

## 2014-02-24 DIAGNOSIS — R269 Unspecified abnormalities of gait and mobility: Secondary | ICD-10-CM | POA: Diagnosis not present

## 2014-02-24 DIAGNOSIS — R262 Difficulty in walking, not elsewhere classified: Secondary | ICD-10-CM | POA: Diagnosis not present

## 2014-02-24 DIAGNOSIS — F0390 Unspecified dementia without behavioral disturbance: Secondary | ICD-10-CM | POA: Diagnosis not present

## 2014-02-26 DIAGNOSIS — R269 Unspecified abnormalities of gait and mobility: Secondary | ICD-10-CM | POA: Diagnosis not present

## 2014-02-26 DIAGNOSIS — R262 Difficulty in walking, not elsewhere classified: Secondary | ICD-10-CM | POA: Diagnosis not present

## 2014-02-26 DIAGNOSIS — F0390 Unspecified dementia without behavioral disturbance: Secondary | ICD-10-CM | POA: Diagnosis not present

## 2014-03-01 DIAGNOSIS — R269 Unspecified abnormalities of gait and mobility: Secondary | ICD-10-CM | POA: Diagnosis not present

## 2014-03-01 DIAGNOSIS — F0390 Unspecified dementia without behavioral disturbance: Secondary | ICD-10-CM | POA: Diagnosis not present

## 2014-03-01 DIAGNOSIS — R262 Difficulty in walking, not elsewhere classified: Secondary | ICD-10-CM | POA: Diagnosis not present

## 2014-03-02 DIAGNOSIS — R269 Unspecified abnormalities of gait and mobility: Secondary | ICD-10-CM | POA: Diagnosis not present

## 2014-03-02 DIAGNOSIS — R262 Difficulty in walking, not elsewhere classified: Secondary | ICD-10-CM | POA: Diagnosis not present

## 2014-03-02 DIAGNOSIS — F0390 Unspecified dementia without behavioral disturbance: Secondary | ICD-10-CM | POA: Diagnosis not present

## 2014-03-03 DIAGNOSIS — R269 Unspecified abnormalities of gait and mobility: Secondary | ICD-10-CM | POA: Diagnosis not present

## 2014-03-03 DIAGNOSIS — F0390 Unspecified dementia without behavioral disturbance: Secondary | ICD-10-CM | POA: Diagnosis not present

## 2014-03-03 DIAGNOSIS — R262 Difficulty in walking, not elsewhere classified: Secondary | ICD-10-CM | POA: Diagnosis not present

## 2014-03-05 DIAGNOSIS — R269 Unspecified abnormalities of gait and mobility: Secondary | ICD-10-CM | POA: Diagnosis not present

## 2014-03-05 DIAGNOSIS — F0390 Unspecified dementia without behavioral disturbance: Secondary | ICD-10-CM | POA: Diagnosis not present

## 2014-03-05 DIAGNOSIS — R262 Difficulty in walking, not elsewhere classified: Secondary | ICD-10-CM | POA: Diagnosis not present

## 2014-03-08 DIAGNOSIS — F0390 Unspecified dementia without behavioral disturbance: Secondary | ICD-10-CM | POA: Diagnosis not present

## 2014-03-08 DIAGNOSIS — R262 Difficulty in walking, not elsewhere classified: Secondary | ICD-10-CM | POA: Diagnosis not present

## 2014-03-08 DIAGNOSIS — R269 Unspecified abnormalities of gait and mobility: Secondary | ICD-10-CM | POA: Diagnosis not present

## 2014-03-09 DIAGNOSIS — R262 Difficulty in walking, not elsewhere classified: Secondary | ICD-10-CM | POA: Diagnosis not present

## 2014-03-09 DIAGNOSIS — R269 Unspecified abnormalities of gait and mobility: Secondary | ICD-10-CM | POA: Diagnosis not present

## 2014-03-09 DIAGNOSIS — F0390 Unspecified dementia without behavioral disturbance: Secondary | ICD-10-CM | POA: Diagnosis not present

## 2014-03-10 DIAGNOSIS — F0390 Unspecified dementia without behavioral disturbance: Secondary | ICD-10-CM | POA: Diagnosis not present

## 2014-03-10 DIAGNOSIS — R262 Difficulty in walking, not elsewhere classified: Secondary | ICD-10-CM | POA: Diagnosis not present

## 2014-03-10 DIAGNOSIS — R269 Unspecified abnormalities of gait and mobility: Secondary | ICD-10-CM | POA: Diagnosis not present

## 2014-03-12 DIAGNOSIS — R262 Difficulty in walking, not elsewhere classified: Secondary | ICD-10-CM | POA: Diagnosis not present

## 2014-03-12 DIAGNOSIS — R269 Unspecified abnormalities of gait and mobility: Secondary | ICD-10-CM | POA: Diagnosis not present

## 2014-03-12 DIAGNOSIS — F0390 Unspecified dementia without behavioral disturbance: Secondary | ICD-10-CM | POA: Diagnosis not present

## 2014-03-17 DIAGNOSIS — R262 Difficulty in walking, not elsewhere classified: Secondary | ICD-10-CM | POA: Diagnosis not present

## 2014-03-17 DIAGNOSIS — R269 Unspecified abnormalities of gait and mobility: Secondary | ICD-10-CM | POA: Diagnosis not present

## 2014-03-17 DIAGNOSIS — F0390 Unspecified dementia without behavioral disturbance: Secondary | ICD-10-CM | POA: Diagnosis not present

## 2014-03-22 DIAGNOSIS — F0390 Unspecified dementia without behavioral disturbance: Secondary | ICD-10-CM | POA: Diagnosis not present

## 2014-03-22 DIAGNOSIS — R262 Difficulty in walking, not elsewhere classified: Secondary | ICD-10-CM | POA: Diagnosis not present

## 2014-03-22 DIAGNOSIS — R269 Unspecified abnormalities of gait and mobility: Secondary | ICD-10-CM | POA: Diagnosis not present

## 2014-03-24 DIAGNOSIS — R269 Unspecified abnormalities of gait and mobility: Secondary | ICD-10-CM | POA: Diagnosis not present

## 2014-03-24 DIAGNOSIS — F0390 Unspecified dementia without behavioral disturbance: Secondary | ICD-10-CM | POA: Diagnosis not present

## 2014-03-24 DIAGNOSIS — R262 Difficulty in walking, not elsewhere classified: Secondary | ICD-10-CM | POA: Diagnosis not present

## 2014-03-31 ENCOUNTER — Other Ambulatory Visit: Payer: Self-pay | Admitting: Internal Medicine

## 2014-03-31 ENCOUNTER — Other Ambulatory Visit: Payer: Self-pay | Admitting: Physician Assistant

## 2014-05-27 ENCOUNTER — Other Ambulatory Visit: Payer: Self-pay | Admitting: Internal Medicine

## 2014-05-27 ENCOUNTER — Other Ambulatory Visit: Payer: Self-pay

## 2014-05-27 MED ORDER — QUETIAPINE FUMARATE 25 MG PO TABS: ORAL_TABLET | ORAL | Status: DC

## 2014-05-31 ENCOUNTER — Ambulatory Visit: Payer: Self-pay

## 2014-05-31 ENCOUNTER — Ambulatory Visit (INDEPENDENT_AMBULATORY_CARE_PROVIDER_SITE_OTHER): Payer: Medicare Other | Admitting: Internal Medicine

## 2014-05-31 ENCOUNTER — Encounter: Payer: Self-pay | Admitting: Internal Medicine

## 2014-05-31 VITALS — BP 116/58 | HR 68 | Temp 97.8°F | Resp 16 | Ht 66.0 in | Wt 163.0 lb

## 2014-05-31 DIAGNOSIS — B351 Tinea unguium: Secondary | ICD-10-CM | POA: Diagnosis not present

## 2014-05-31 DIAGNOSIS — T148XXA Other injury of unspecified body region, initial encounter: Secondary | ICD-10-CM

## 2014-05-31 MED ORDER — HYDROCODONE-ACETAMINOPHEN 5-325 MG PO TABS: ORAL_TABLET | ORAL | Status: DC

## 2014-05-31 MED ORDER — MUPIROCIN CALCIUM 2 % EX CREA: TOPICAL_CREAM | CUTANEOUS | Status: DC

## 2014-05-31 NOTE — Progress Notes (Signed)
   Subjective:    Patient ID: [name] [name], female    DOB: [birth year], 81 y.o.   MRN: [id number]  Wound Check   Patient reports to the office with her niece for evaluation of a leg wound.  Patient thinks that she knocked her leg on something approximately 2 weeks ago.  It has been getting progressively more sore and painful.  They have been keeping it clean and dry and have been using tegaderm on her leg.  She has not had any antibiotics at this time.  She does not report a history of poor wound healing in the past.  She does take 2 tylenol extra strength when it becomes really painful.  She has been able to ambulate with her cane.  Her relative reports that her gait is mainly unchanged.      Review of Systems  Constitutional: Negative for fever, chills and fatigue.  Gastrointestinal: Negative for nausea and vomiting.  Musculoskeletal: Positive for joint swelling. Negative for gait problem.  Skin: Positive for color change and wound.  Neurological: Negative for dizziness and numbness.       Objective:   Physical Exam  Constitutional: She is oriented to person, place, and time. She appears well-developed and well-nourished. No distress.  HENT:  Head: Normocephalic.  Mouth/Throat: Oropharynx is clear and moist. No oropharyngeal exudate.  Eyes: Conjunctivae and EOM are normal. Pupils are equal, round, and reactive to light. No scleral icterus.  Neck: Normal range of motion. Neck supple. No JVD present. No thyromegaly present.  Cardiovascular: Normal rate, regular rhythm and intact distal pulses.  Exam reveals no gallop and no friction rub.   Murmur heard. Pulses:      Dorsalis pedis pulses are 1+ on the right side, and 1+ on the left side.       Posterior tibial pulses are 1+ on the right side, and 1+ on the left side.  Chronic venous stasis changes to lower legs bilaterally  Pulmonary/Chest: Breath sounds normal. No respiratory distress. She has no wheezes. She has no rales. She  exhibits no tenderness.  Musculoskeletal: Normal range of motion.  Lymphadenopathy:    She has no cervical adenopathy.  Neurological: She is alert and oriented to person, place, and time.  Skin: She is not diaphoretic.     Nursing note and vitals reviewed.   Filed Vitals:   05/31/14 1152  BP: 116/58  Pulse: 68  Temp: 97.8 F (36.6 C)  Resp: 16         Assessment & Plan:    1. Skin avulsion Will attempt to dry out the skin to aid in healing.   -Skin trimmed here and redressed -no evidence of cellulitis currently -Will recheck in 2 weeks -Patient's family to call if signs of cellulitis which we discussed. - mupirocin cream (BACTROBAN) 2 %; Apply to affected area 3 times daily  Dispense: 30 g; Refill: 0 - HYDROcodone-acetaminophen (NORCO) 5-325 MG per tablet; 1/2 to 1 tablet as needed for pain  Dispense: 20 tablet; Refill: 0   2. Fungal nail infection -may be trophic nail changes.   -safer to have podiatry trim nails. - Ambulatory referral to Podiatry

## 2014-05-31 NOTE — Patient Instructions (Signed)
Deep Skin Avulsion °A deep skin avulsion is when all layers of the skin or parts of body structures have been torn away. This is usually a result of severe injury (trauma). A deep skin avulsion can include damage to important structures beneath the skin such as tendons, ligaments, nerves, or blood vessels.  °CAUSES  °Many injuries can lead to a deep skin avulsion. These include:  °· Crush injuries. °· Bites. °· Falls against jagged surfaces. °· Gunshot wounds. °· Severe burns and injuries involving dragging (such as those from a bicycle or motorcycle accident). °TREATMENT  °· If the wound is small and there is no damage to vital structures like nerves and blood vessels, the damaged tissues may be removed. Then, the wound can be cleaned thoroughly and closed. °· A skin graft may be performed. This is a procedure in which the outer layer of skin is removed from a different part of your body. That skin (skin graft) is used to cover the open wound. This can happen after damaged tissue is removed and repairs are completed. °· Your caregiver may only apply a bandage (dressing) to the wound. The wound will be kept clean and allowed to heal. Healing can take weeks or months and usually leaves a large scar. This type of treatment is only done if your caregiver feels that skin grafting or a similar procedure would not work. °You might need a tetanus shot if: °· You cannot remember when you had your last tetanus shot. °· You have never had a tetanus shot. °· The injury broke your skin. °If you got a tetanus shot, your arm may swell, get red, and feel warm to the touch. This is common and not a problem. If you need a tetanus shot and you choose not to have one, there is a rare chance of getting tetanus. Sickness from tetanus can be serious. °HOME CARE INSTRUCTIONS  °· Only take over-the-counter or prescription medicines for pain, discomfort, or fever as directed by your caregiver. °· Gently wash the area with mild soap and  water 2 times a day, or as directed. Rinse off the soap. Pat the area dry with a clean towel. Do not rub the wound. This may cause bleeding. °· Follow your caregiver's instructions for how often you need to change the dressing. °· Apply ointment and a dressing to the wound as directed. °· If the dressing sticks, moisten it with soapy water and gently remove it. °· Change the bandage right away if it becomes wet, dirty, or starts to smell bad. °· Take showers. Do not take tub baths, swim, or do anything that may soak the wound until it is healed. °· Use anti-itch medicine as directed by your caregiver. The wound may itch when it is healing. Do not pick or scratch at the wound. °· Follow up with your caregiver for stitches (sutures), staple, or skin adhesive strip removal. °SEEK MEDICAL CARE IF:  °· You have redness, swelling, or increasing pain in your wound. °· A red streak or line extends away from the wound. °· You have pus coming from the wound. °· You notice a bad smell coming from the wound or dressing. °· The wound breaks open (edges not staying together) after sutures have been removed. °· You notice something coming out of the wound, such as a small piece of wood, glass, or metal. °· You are unable to properly move a finger or toe if the wound is on your hand or foot. °· You have severe   swelling around the wound that causes pain and numbness. °· Your arm, hand, leg, or foot changes color. °SEEK IMMEDIATE MEDICAL CARE IF:  °· Your pain becomes severe or is not adequately relieved with pain medicine. °· You have a fever. °· You have nausea and vomiting for more than 24 hours. °· You feel lightheaded, weak, or faint. °· You develop chest pain or difficulty breathing. °MAKE SURE YOU:  °· Understand these instructions. °· Will watch your condition. °· Will get help right away if you are not doing well or get worse. °Document Released: 03/13/2006 Document Revised: 04/09/2011 Document Reviewed:  05/21/2010 °ExitCare® Patient Information ©2015 [employer]. This information is not intended to replace advice given to you by your health care provider. Make sure you discuss any questions you have with your health care provider. ° °

## 2014-06-16 ENCOUNTER — Encounter: Payer: Self-pay | Admitting: Internal Medicine

## 2014-06-16 ENCOUNTER — Ambulatory Visit (INDEPENDENT_AMBULATORY_CARE_PROVIDER_SITE_OTHER): Payer: Medicare Other | Admitting: Internal Medicine

## 2014-06-16 VITALS — BP 120/54 | HR 70 | Temp 98.0°F | Resp 16 | Ht 66.0 in

## 2014-06-16 DIAGNOSIS — I10 Essential (primary) hypertension: Secondary | ICD-10-CM | POA: Diagnosis not present

## 2014-06-16 DIAGNOSIS — R6889 Other general symptoms and signs: Secondary | ICD-10-CM | POA: Diagnosis not present

## 2014-06-16 DIAGNOSIS — J8410 Pulmonary fibrosis, unspecified: Secondary | ICD-10-CM

## 2014-06-16 DIAGNOSIS — E1122 Type 2 diabetes mellitus with diabetic chronic kidney disease: Secondary | ICD-10-CM | POA: Diagnosis not present

## 2014-06-16 DIAGNOSIS — E039 Hypothyroidism, unspecified: Secondary | ICD-10-CM

## 2014-06-16 DIAGNOSIS — K219 Gastro-esophageal reflux disease without esophagitis: Secondary | ICD-10-CM

## 2014-06-16 DIAGNOSIS — Z1331 Encounter for screening for depression: Secondary | ICD-10-CM

## 2014-06-16 DIAGNOSIS — E782 Mixed hyperlipidemia: Secondary | ICD-10-CM

## 2014-06-16 DIAGNOSIS — F09 Unspecified mental disorder due to known physiological condition: Secondary | ICD-10-CM

## 2014-06-16 DIAGNOSIS — F0391 Unspecified dementia with behavioral disturbance: Secondary | ICD-10-CM

## 2014-06-16 DIAGNOSIS — R296 Repeated falls: Secondary | ICD-10-CM | POA: Diagnosis not present

## 2014-06-16 DIAGNOSIS — Z9181 History of falling: Secondary | ICD-10-CM

## 2014-06-16 DIAGNOSIS — E559 Vitamin D deficiency, unspecified: Secondary | ICD-10-CM

## 2014-06-16 DIAGNOSIS — J449 Chronic obstructive pulmonary disease, unspecified: Secondary | ICD-10-CM

## 2014-06-16 DIAGNOSIS — J45901 Unspecified asthma with (acute) exacerbation: Secondary | ICD-10-CM

## 2014-06-16 DIAGNOSIS — Z0001 Encounter for general adult medical examination with abnormal findings: Secondary | ICD-10-CM

## 2014-06-16 DIAGNOSIS — K589 Irritable bowel syndrome without diarrhea: Secondary | ICD-10-CM

## 2014-06-16 DIAGNOSIS — F32A Depression, unspecified: Secondary | ICD-10-CM

## 2014-06-16 DIAGNOSIS — J4489 Other specified chronic obstructive pulmonary disease: Secondary | ICD-10-CM

## 2014-06-16 DIAGNOSIS — Z0000 Encounter for general adult medical examination without abnormal findings: Secondary | ICD-10-CM

## 2014-06-16 DIAGNOSIS — F329 Major depressive disorder, single episode, unspecified: Secondary | ICD-10-CM

## 2014-06-16 DIAGNOSIS — E1129 Type 2 diabetes mellitus with other diabetic kidney complication: Secondary | ICD-10-CM | POA: Diagnosis not present

## 2014-06-16 DIAGNOSIS — Z79899 Other long term (current) drug therapy: Secondary | ICD-10-CM

## 2014-06-16 DIAGNOSIS — J45909 Unspecified asthma, uncomplicated: Secondary | ICD-10-CM

## 2014-06-16 LAB — BASIC METABOLIC PANEL WITH GFR
BUN: 23 mg/dL (ref 6–23)
CALCIUM: 9.2 mg/dL (ref 8.4–10.5)
CHLORIDE: 103 meq/L (ref 96–112)
CO2: 29 mEq/L (ref 19–32)
CREATININE: 1.09 mg/dL (ref 0.50–1.10)
GFR, EST NON AFRICAN AMERICAN: 47 mL/min — AB
GFR, Est African American: 54 mL/min — ABNORMAL LOW
Glucose, Bld: 99 mg/dL (ref 70–99)
Potassium: 4 mEq/L (ref 3.5–5.3)
Sodium: 141 mEq/L (ref 135–145)

## 2014-06-16 LAB — CBC WITH DIFFERENTIAL/PLATELET
BASOS ABS: 0 10*3/uL (ref 0.0–0.1)
Basophils Relative: 0 % (ref 0–1)
EOS ABS: 0.2 10*3/uL (ref 0.0–0.7)
Eosinophils Relative: 4 % (ref 0–5)
HCT: 31.6 % — ABNORMAL LOW (ref 36.0–46.0)
Hemoglobin: 10.6 g/dL — ABNORMAL LOW (ref 12.0–15.0)
LYMPHS PCT: 22 % (ref 12–46)
Lymphs Abs: 1 10*3/uL (ref 0.7–4.0)
MCH: 30.2 pg (ref 26.0–34.0)
MCHC: 33.5 g/dL (ref 30.0–36.0)
MCV: 90 fL (ref 78.0–100.0)
MPV: 8.7 fL (ref 8.6–12.4)
Monocytes Absolute: 0.5 10*3/uL (ref 0.1–1.0)
Monocytes Relative: 11 % (ref 3–12)
NEUTROS PCT: 63 % (ref 43–77)
Neutro Abs: 2.8 10*3/uL (ref 1.7–7.7)
Platelets: 295 10*3/uL (ref 150–400)
RBC: 3.51 MIL/uL — ABNORMAL LOW (ref 3.87–5.11)
RDW: 13 % (ref 11.5–15.5)
WBC: 4.4 10*3/uL (ref 4.0–10.5)

## 2014-06-16 LAB — HEPATIC FUNCTION PANEL
ALK PHOS: 58 U/L (ref 39–117)
ALT: 17 U/L (ref 0–35)
AST: 19 U/L (ref 0–37)
Albumin: 4.1 g/dL (ref 3.5–5.2)
BILIRUBIN INDIRECT: 0.3 mg/dL (ref 0.2–1.2)
Bilirubin, Direct: 0.1 mg/dL (ref 0.0–0.3)
TOTAL PROTEIN: 6.8 g/dL (ref 6.0–8.3)
Total Bilirubin: 0.4 mg/dL (ref 0.2–1.2)

## 2014-06-16 LAB — LIPID PANEL
CHOLESTEROL: 290 mg/dL — AB (ref 0–200)
HDL: 99 mg/dL (ref >=46)
LDL Cholesterol: 167 mg/dL — ABNORMAL HIGH (ref 0–99)
Total CHOL/HDL Ratio: 2.9 Ratio
Triglycerides: 118 mg/dL (ref <150)
VLDL: 24 mg/dL (ref 0–40)

## 2014-06-16 LAB — HEMOGLOBIN A1C
Hgb A1c MFr Bld: 6.4 % — ABNORMAL HIGH (ref <5.7)
Mean Plasma Glucose: 137 mg/dL — ABNORMAL HIGH (ref <117)

## 2014-06-16 LAB — MAGNESIUM: Magnesium: 2.3 mg/dL (ref 1.5–2.5)

## 2014-06-16 LAB — TSH: TSH: 0.865 u[IU]/mL (ref 0.350–4.500)

## 2014-06-16 MED ORDER — BENAZEPRIL HCL 10 MG PO TABS: 10.0000 mg | ORAL_TABLET | Freq: Every day | ORAL | Status: DC

## 2014-06-16 NOTE — Progress Notes (Addendum)
Patient ID: KYLIEANN EAGLES, female   DOB: [birth year], 81 y.o.   MRN: [id number]   [facility] VISIT and wound check  Assessment:    1. Essential hypertension -BP mildly low.  Family reports previous dehydration and patient does not drink well.   -stop benzapril hctz and start benazepril 10 mg daily -monitor at home -DASH diet -drink plenty of fluids. - TSH  2. Asthma, unspecified asthma severity, uncomplicated -currently well controlled inhalers -worse with heat  3. Type 2 diabetes mellitus with diabetic chronic kidney disease -diet and exercise -cont meds - Hemoglobin A1c - Insulin, random  4. Mixed hyperlipidemia -diet and exercise -given history consider doing a low dose cholesterol medication or red yeast rice - Lipid panel  5. Vitamin D deficiency -cont supplement - Vitamin D 1,25 dihydroxy  6. Medication management  - CBC with Differential/Platelet - BASIC METABOLIC PANEL WITH GFR - Hepatic function panel - Magnesium  7. Postinflammatory pulmonary fibrosis -monitored by Dr. Joya Gaskins  8. COPD with chronic bronchitis -Monitored by Dr. Joya Gaskins  10. Gastroesophageal reflux disease, esophagitis presence not specified -avoid trigger foods -zantac prn  11. Irritable bowel syndrome -avoid trigger foods -appears to be doing well currently  12. Hypothyroidism, unspecified hypothyroidism type -TSH -cont levothyroxine  13. Dementia, with behavioral disturbance -cont meds -has home caretaker  51. OBS -cont meds as seen above -monitor  15. Depression -well controlled on current medication regimen     Plan:   During the course of the visit the patient was educated and counseled about appropriate screening and preventive services including:    Pneumococcal vaccine   Influenza vaccine  Td vaccine  Screening electrocardiogram  Bone densitometry screening  Colorectal cancer screening  Diabetes screening  Glaucoma  screening  Nutrition counseling   Advanced directives: requested  Screening recommendations, referrals: Vaccinations:  Immunization History  Administered Date(s) Administered  . Influenza-Unspecified 11/26/2012  . Pneumococcal-Unspecified 02/01/2009  . Td 02/02/2012  Prevnar 13 vaccine due but not in stock.    Nutrition assessed and recommended  Colonoscopy not indicated Recommended yearly ophthalmology/optometry visit for glaucoma screening and checkup Recommended yearly dental visit for hygiene and checkup Advanced directives - not indicated  Conditions/risks identified: BMI: Discussed weight loss, diet, and increase physical activity.  Increase physical activity: AHA recommends 150 minutes of physical activity a week.  Medications reviewed Diabetes is at goal, ACE/ARB therapy: Yes. Urinary Incontinence is not an issue: discussed non pharmacology and pharmacology options.  Fall risk: low- discussed PT, home fall assessment, medications.   Subjective:    EMILIA KAYES is a 81 y.o.  female who presents for Medicare Annual Wellness Visit and complete physical.  Date of last medicare wellness visit is unknown.  She has had elevated blood pressure since 1992. Her blood pressure has been controlled at home, & today their BP is BP: (!) 120/54 mmHg She does not workout. She denies chest pain, shortness of breath, dizziness.   She is not on cholesterol medication and denies myalgias. Her cholesterol is not at goal. The cholesterol last visit was:  Lab Results  Component Value Date   CHOL 244* 06/25/2013   HDL 80 06/25/2013   LDLCALC 143* 06/25/2013   TRIG 107 06/25/2013   CHOLHDL 3.1 06/25/2013    She has had diabetes for 15 years (2001). She has not been working on diet and exercise for diabetes, and denies foot ulcerations, hyperglycemia, hypoglycemia , increased appetite, nausea, paresthesia of the feet, polydipsia, polyuria, visual disturbances, vomiting  and weight loss.  Last A1C in the office was:  Lab Results  Component Value Date   HGBA1C 6.4* 06/25/2013    Patient is on Vitamin D supplement.   Lab Results  Component Value Date   VD25OH 95* 06/25/2013      She reports that today she does not feel great.  She recently had a skin tear 2 weeks ago which she states is feeling much better and has improved tremendously.    She does have podiatry appointment for evaluation of fungal nail changes and nail trimming this week.  She also has an upcoming eye appointment with Dr. Delman Cheadle.    She does not see a dentist.    Her granddaughter reports that she does not drink very much water.    Names of Other Physician/Practitioners you currently use: 1. [facility] here for primary care 2. Dr. Delman Cheadle , eye doctor, last visit 2015 3. Does not see, Dentures, dentist, last visit unknown  Patient Care Team: Unk Pinto, MD as PCP - General (Internal Medicine)  Medication Review: Current Outpatient Prescriptions on File Prior to Visit  Medication Sig Dispense Refill  . Ascorbic Acid (VITAMIN C) 1000 MG tablet Take 1,000 mg by mouth daily.    Marland Kitchen aspirin 81 MG EC tablet Take 81 mg by mouth every Monday, Wednesday, and Friday.     . benazepril-hydrochlorthiazide (LOTENSIN HCT) 20-12.5 MG per tablet Take 1 tablet by mouth daily. 90 tablet 3  . Cholecalciferol (VITAMIN D) 2000 UNITS CAPS Take 2,000 Units by mouth 2 (two) times daily.     . citalopram (CELEXA) 40 MG tablet Take 40 mg by mouth daily.    . clopidogrel (PLAVIX) 75 MG tablet Take 75 mg by mouth See admin instructions. Take 75 mg by mouth every Tuesday, Thursday and saturday    . HYDROcodone-acetaminophen (NORCO) 5-325 MG per tablet 1/2 to 1 tablet as needed for pain 20 tablet 0  . levothyroxine (SYNTHROID, LEVOTHROID) 75 MCG tablet take 1 tablet by mouth once daily 90 tablet PRN  . loratadine (CLARITIN) 10 MG tablet Take 10 mg by mouth every morning.     .  magnesium gluconate (MAGONATE) 500 MG tablet Take 500 mg by mouth daily.    . metFORMIN (GLUCOPHAGE-XR) 500 MG 24 hr tablet take 1 to 2 tablets by mouth twice a day after meals 120 tablet 99  . Multiple Vitamins-Minerals (CENTRUM SILVER PO) Take 1 tablet by mouth daily.     . mupirocin cream (BACTROBAN) 2 % Apply to affected area 3 times daily 30 g 0  . Omega-3 Fatty Acids (FISH OIL PO) Take 1,000 mg by mouth every other day.    . predniSONE (DELTASONE) 5 MG tablet take 1 tablet by mouth three times a day as directed BY PRESCRIBER 100 tablet 3  . Probiotic Product (RESTORA PO) Take 1 tablet by mouth daily.     . QUEtiapine (SEROQUEL) 25 MG tablet Take 3 to 4 tablets at bedtime as directed 120 tablet 5  . verapamil (CALAN-SR) 240 MG CR tablet   0   No current facility-administered medications on file prior to visit.    Current Problems (verified) Patient Active Problem List   Diagnosis Date Noted  . T2_NIDDM w/Stage 3 CKD (GFR 54 ml/min) 11/27/2013  . COPD with chronic bronchitis 11/27/2013  . Asthma with exacerbation 11/27/2013  . Encounter for long-term (current) use of other medications 06/25/2013  . OBS 05/22/2013  . Vitamin D deficiency   .  Dementia   . Depression   . Postinflammatory pulmonary fibrosis 04/14/2007  . Hypothyroidism 01/24/2007  . Mixed hyperlipidemia 01/24/2007  . Essential hypertension 01/24/2007  . ASTHMA 01/24/2007  . GERD 01/24/2007  . Irritable bowel syndrome 01/24/2007    Screening Tests Health Maintenance  Topic Date Due  . FOOT EXAM  03/20/1939  . OPHTHALMOLOGY EXAM  [birth year]/20/1941  . ZOSTAVAX  03/21/1991  . DEXA SCAN  03/20/1996  . PNA vac Low Risk Adult (2 of 2 - PCV13) 02/01/2010  . HEMOGLOBIN A1C  12/26/2013  . URINE MICROALBUMIN  06/26/2014  . INFLUENZA VACCINE  08/30/2014  . COLONOSCOPY  01/02/2017  . TETANUS/TDAP  02/01/2022    Immunization History  Administered Date(s) Administered  . Influenza-Unspecified 11/26/2012  .  Pneumococcal-Unspecified 02/01/2009  . Td 02/02/2012    Preventative care: Last colonoscopy: 2008 History reviewed: allergies, current medications, past family history, past medical history, past social history, past surgical history and problem list  Risk Factors: Tobacco History  Substance Use Topics  . Smoking status: Never Smoker   . Smokeless tobacco: Never Used  . Alcohol Use: No   She does not smoke.  Patient is not a former smoker. Are there smokers in your home (other than you)?  No Alcohol Current alcohol use: none  Caffeine Current caffeine use: denies use  Exercise Current exercise: walking  Nutrition/Diet Current diet: in general, a "healthy" diet    Cardiac risk factors: advanced age (older than 46 for men, 78 for women), diabetes mellitus, dyslipidemia, hypertension and sedentary lifestyle.  Depression Screen (Note: if answer to either of the following is "Yes", a more complete depression screening is indicated)   Q1: Over the past two weeks, have you felt down, depressed or hopeless? No  Q2: Over the past two weeks, have you felt little interest or pleasure in doing things? No  Have you lost interest or pleasure in daily life? No  Do you often feel hopeless? No  Do you cry easily over simple problems? No  Activities of Daily Living In your present state of health, do you have any difficulty performing the following activities?:  Driving? No Managing money?  No Feeding yourself? No Getting from bed to chair? No Climbing a flight of stairs? No Preparing food and eating?: No Bathing or showering? No Getting dressed: No Getting to the toilet? No Using the toilet:No Moving around from place to place: No In the past year have you fallen or had a near fall?:Yes   Are you sexually active?  No  Do you have more than one partner?  No  Vision Difficulties: No  Hearing Difficulties: No Do you often ask people to speak up or repeat themselves? No Do  you experience ringing or noises in your ears? No Do you have difficulty understanding soft or whispered voices? Sometimes.  Cognition  Do you feel that you have a problem with memory?No  Do you often misplace items? No  Do you feel safe at home?  Yes  Advanced directives Does patient have a [place]? Yes Does patient have a Living Will? Yes  Past Medical History  Diagnosis Date  . Asthma   . Type II or unspecified type diabetes mellitus without mention of complication, not stated as uncontrolled   . Hyperlipidemia   . Chronic headaches   . Hypertension   . Pulmonary fibrosis     Chronic granulomatous changes on CT chest, minimal mediastinal LAN 3/09  . COPD (chronic obstructive  pulmonary disease)   . Vitamin D deficiency   . B12 deficiency   . Allergy   . Dementia   . Depression   . Hypothyroidism   . CKD (chronic kidney disease) stage 3, GFR 30-59 ml/min    Past Surgical History  Procedure Laterality Date  . Appendectomy    . Thyroidectomy    . Tubal ligation    . Esophagogastroduodenoscopy  2012    Gastritis  . Breast biopsy      right breast/benign  . Endometrial biopsy  1998    negative    Review of Systems  Constitutional: Positive for malaise/fatigue. Negative for fever, chills and weight loss.  HENT: Negative for congestion, ear pain, sore throat and tinnitus.   Eyes: Negative.   Respiratory: Negative for cough, shortness of breath and wheezing.   Cardiovascular: Negative for chest pain, palpitations and leg swelling.  Gastrointestinal: Negative for heartburn, nausea, vomiting, diarrhea, constipation, blood in stool and melena.  Genitourinary: Negative for dysuria, urgency and frequency.  Skin: Negative.   Neurological: Positive for dizziness (occasionally when standing). Negative for tingling, sensory change, loss of consciousness, weakness and headaches.  Endo/Heme/Allergies: Bruises/bleeds easily.  Psychiatric/Behavioral: Positive  for depression (well controlled on medication) and memory loss (chronic dementia). The patient is not nervous/anxious.      Objective:     BP 120/54 mmHg  Pulse 70  Temp(Src) 98 F (36.7 C) (Temporal)  Resp 16  Ht 5\' 6"  (1.676 m)  General Appearance: Well nourished, alert, WD/WN, female and in no apparent distress. Eyes: PERRLA, EOMs, conjunctiva no swelling or erythema. Glasses in place.   Sinuses: No frontal/maxillary tenderness ENT/Mouth: EACs patent / TMs  nl. Nares clear without erythema, swelling, mucoid exudates. Oral hygiene is good. No erythema, swelling, or exudate. Tongue normal, non-obstructing. Tonsils not swollen or erythematous. Hearing normal. Edentulous with dentures in place.  Dentures are well worn.   Neck: Supple, thyroid normal. No bruits, nodes or JVD. Respiratory: Respiratory effort normal.  BS equal and distant bilateral without rales, rhonci, wheezing or stridor. Cardio: Heart sounds are normal with regular rate and rhythm and no murmurs, rubs or gallops. Peripheral pulses are 1+ and equal bilaterally without edema.  Chest: symmetric with normal excursions and percussion.  Abdomen: Flat, soft  with nl bowel sounds. Nontender, no guarding, rebound, hernias, masses, or organomegaly.  Lymphatics: Non tender without lymphadenopathy.  Musculoskeletal: Full ROM all peripheral extremities, joint stability, 5/5 strength, and normal gait with cane.  No antalgia. Skin: Warm and dry without rashes, cyanosis, clubbing or  ecchymosis. Well healing skin avulsion to the right anterior mid shin with no TTP, weeping. Venous stasis color changes noted to the bilateral shins.  Thick mishappen toenails bilaterally on feet.   Neuro: Cranial nerves intact, reflexes equal bilaterally. Normal muscle tone, no cerebellar symptoms. Sensation intact.  Pysch: Alert and oriented X 3, normal affect, Insight and Judgment appropriate.   Cognitive Testing  Alert? Yes  Normal  Appearance?Yes  Oriented to person? Yes  Place? Yes   Time? Yes  Recall of three objects?  Yes  Can perform simple calculations? Yes  Displays appropriate judgment? Yes  Can read the correct time from a watch/clock?Yes  Medicare Attestation I have personally reviewed: The patient's medical and social history Their use of alcohol, tobacco or illicit drugs Their current medications and supplements The patient's functional ability including ADLs,fall risks, home safety risks, cognitive, and hearing and visual impairment Diet and physical activities Evidence for depression or mood  disorders  The patient's weight, height, BMI, and visual acuity have been recorded in the chart.  I have made referrals, counseling, and provided education to the patient based on review of the above and I have provided the patient with a written personalized care plan for preventive services.  Over 40 minutes of exam, counseling, chart review was performed.   Starlyn Skeans, PA-C   06/16/2014

## 2014-06-17 LAB — INSULIN, RANDOM: INSULIN: 7.7 u[IU]/mL (ref 2.0–19.6)

## 2014-06-19 LAB — VITAMIN D 1,25 DIHYDROXY
VITAMIN D 1, 25 (OH) TOTAL: 43 pg/mL (ref 18–72)
Vitamin D2 1, 25 (OH)2: <8 pg/mL
Vitamin D3 1, 25 (OH)2: 43 pg/mL

## 2014-06-30 ENCOUNTER — Ambulatory Visit (INDEPENDENT_AMBULATORY_CARE_PROVIDER_SITE_OTHER): Payer: Medicare Other | Admitting: Internal Medicine

## 2014-06-30 ENCOUNTER — Other Ambulatory Visit: Payer: Self-pay | Admitting: Internal Medicine

## 2014-06-30 ENCOUNTER — Encounter: Payer: Self-pay | Admitting: Internal Medicine

## 2014-06-30 VITALS — BP 124/68 | HR 72 | Temp 97.4°F | Resp 16 | Ht 66.0 in | Wt 163.0 lb

## 2014-06-30 DIAGNOSIS — D509 Iron deficiency anemia, unspecified: Secondary | ICD-10-CM | POA: Diagnosis not present

## 2014-06-30 DIAGNOSIS — E538 Deficiency of other specified B group vitamins: Secondary | ICD-10-CM | POA: Diagnosis not present

## 2014-06-30 NOTE — Progress Notes (Signed)
Subjective:    Patient ID: [name] [name], female    DOB: 08-12-1929, 81 y.o.   MRN: [id number]  HPI  Patient with HTN, SDAT, COPD, T2_DM  and Hypothyroidism returns for 1 month f/u from [place] when most labs were WNL except CBC which showed a drop in Hgb.  CBC Latest Ref Rng 06/16/2014 02/22/2014 02/22/2014  WBC 4.0 - 10.5 K/uL 4.4 - 7.8  Hemoglobin 12.0 - 15.0 g/dL 10.6(L) 13.3 12.4  Hematocrit 36.0 - 46.0 % 31.6(L) 39.0 37.3  Platelets 150 - 400 K/uL 295 - 238    Systems review is negative for Sn's/Sx's concerning for blood loss altho patient's dementia may supercede her ability to report sx's to her caretakers. Apparently she's reported to have episodes of aggitation and paranoia as well as psychosis with hallucinations on multiple vacations.  Also her med schedule has been somewhat chaotic with a number of various family members attending to her as well as one daughter who has decided that the patient doesn't need medications for psychosis. Patient has been observed by several family members to initiate /engage in dangerous activities as turning the gas stove on w/o lighting the burners (hence gas is now turned off).    Sitter today also reports she has noticed the patient frequently wheezing in the morning   Past Medical History  Diagnosis Date  . Asthma   . Type II or unspecified type diabetes mellitus without mention of complication, not stated as uncontrolled   . Hyperlipidemia   . Chronic headaches   . Hypertension   . Pulmonary fibrosis     Chronic granulomatous changes on CT chest, minimal mediastinal LAN 3/09  . COPD (chronic obstructive pulmonary disease)   . Vitamin D deficiency   . B12 deficiency   . Allergy   . Dementia   . Depression   . Hypothyroidism   . CKD (chronic kidney disease) stage 3, GFR 30-59 ml/min    Past Surgical History  Procedure Laterality Date  . Appendectomy    . Thyroidectomy    . Tubal ligation    . Esophagogastroduodenoscopy   2012    Gastritis  . Breast biopsy      right breast/benign  . Endometrial biopsy  1998    negative   Review of Systems 10 point systems review negative except as above.    Objective:   Physical Exam   BP 124/68   Pulse 72  Temp 97.4 F   Resp 16  Ht 5\' 6"    Wt 163 lb     BMI 26.32   HEENT - Eac's patent. TM's Nl. EOM's full. PERRLA. NasoOroPharynx clear. Neck - supple. Nl Thyroid. Carotids 2+ & No bruits, nodes, JVD Chest - Clear equal BS w/o Rales, rhonchi, wheezes. Cor - Nl HS. RRR w/o sig MGR. No edema. MS- FROM w/o deformities. Generalized decrease in muscle power, tone & bulk. Gait Nl. Neuro - No obvious Cr N abnormalities. Sensory, motor and Cerebellar functions appear Nl w/o focal abnormalities.    Assessment & Plan:   1. Iron deficiency anemia  - CBC with Differential/Platelet - Ferritin - Reticulocytes - Iron and TIBC - Folate RBC  2. B12 deficiency, r/o  - Vitamin B12  - Discussed Meds / SE's - recc increase prednisone 5 mg to bid and strongly recommended maintaining congruency in patient's dosing schedule. Recc taking Seroquel 25 mg x 1 w/Bkfst & 3 tab = 75 mg at supper to avoid early am "hangover"  effect.

## 2014-06-30 NOTE — Patient Instructions (Signed)
  Change Prednisone 5 mg to 2 x day with Breakfast & Supper  And   Change Seroquel 25 mg to   1 tablet at Breakfast  &   3 tablets at [employer]

## 2014-07-01 LAB — CBC WITH DIFFERENTIAL/PLATELET
Basophils Absolute: 0 10*3/uL (ref 0.0–0.1)
Basophils Relative: 0 % (ref 0–1)
EOS PCT: 6 % — AB (ref 0–5)
Eosinophils Absolute: 0.2 10*3/uL (ref 0.0–0.7)
HCT: 32.3 % — ABNORMAL LOW (ref 36.0–46.0)
Hemoglobin: 10.4 g/dL — ABNORMAL LOW (ref 12.0–15.0)
LYMPHS PCT: 29 % (ref 12–46)
Lymphs Abs: 1.2 10*3/uL (ref 0.7–4.0)
MCH: 29.8 pg (ref 26.0–34.0)
MCHC: 32.2 g/dL (ref 30.0–36.0)
MCV: 92.6 fL (ref 78.0–100.0)
MPV: 9 fL (ref 8.6–12.4)
Monocytes Absolute: 0.5 10*3/uL (ref 0.1–1.0)
Monocytes Relative: 13 % — ABNORMAL HIGH (ref 3–12)
NEUTROS ABS: 2.1 10*3/uL (ref 1.7–7.7)
NEUTROS PCT: 52 % (ref 43–77)
Platelets: 263 10*3/uL (ref 150–400)
RBC: 3.49 MIL/uL — ABNORMAL LOW (ref 3.87–5.11)
RDW: 13 % (ref 11.5–15.5)
WBC: 4 10*3/uL (ref 4.0–10.5)

## 2014-07-01 LAB — VITAMIN B12: Vitamin B-12: 712 pg/mL (ref 211–911)

## 2014-07-01 LAB — IRON AND TIBC
%SAT: 7 % — ABNORMAL LOW (ref 20–55)
IRON: 29 ug/dL — AB (ref 42–145)
TIBC: 391 ug/dL (ref 250–470)
UIBC: 362 ug/dL (ref 125–400)

## 2014-07-01 LAB — RETICULOCYTES
ABS Retic: 27.9 10*3/uL (ref 19.0–186.0)
RBC.: 3.49 MIL/uL — AB (ref 3.87–5.11)
Retic Ct Pct: 0.8 % (ref 0.4–2.3)

## 2014-07-01 LAB — FERRITIN: FERRITIN: 12 ng/mL (ref 10–291)

## 2014-07-01 LAB — FOLATE RBC: RBC FOLATE: 1502 ng/mL (ref >280)

## 2014-07-02 ENCOUNTER — Ambulatory Visit: Payer: Self-pay | Admitting: Podiatry

## 2014-07-07 ENCOUNTER — Other Ambulatory Visit: Payer: Self-pay | Admitting: *Deleted

## 2014-07-09 ENCOUNTER — Ambulatory Visit (INDEPENDENT_AMBULATORY_CARE_PROVIDER_SITE_OTHER): Payer: Medicare Other | Admitting: Podiatry

## 2014-07-09 ENCOUNTER — Encounter: Payer: Self-pay | Admitting: Podiatry

## 2014-07-09 VITALS — BP 130/69 | HR 67 | Resp 18

## 2014-07-09 DIAGNOSIS — M79606 Pain in leg, unspecified: Secondary | ICD-10-CM

## 2014-07-09 DIAGNOSIS — B351 Tinea unguium: Secondary | ICD-10-CM | POA: Diagnosis not present

## 2014-07-09 NOTE — Progress Notes (Signed)
   Subjective:    Patient ID: [name] [name], female    DOB: April 24, 1929, 81 y.o.   MRN: [id number]  HPI I HAVE SOME TOENAILS THAT ARE THICK AND FEET GET NUMB AND TINGLE AND SORE AND TENDER AND SOME SWELLING AND I AM A DIABETIC     Review of Systems  Respiratory: Positive for wheezing.   Gastrointestinal: Positive for abdominal pain.  Genitourinary: Positive for urgency.  Musculoskeletal: Positive for back pain.       Difficulty walking and muscle pain  Allergic/Immunologic: Positive for environmental allergies.  Hematological: Bruises/bleeds easily.  Psychiatric/Behavioral: Positive for hallucinations, behavioral problems and confusion.  All other systems reviewed and are negative.      Objective:   Physical Exam        Assessment & Plan:

## 2014-07-12 NOTE — Progress Notes (Signed)
Subjective:     Patient ID: [name] [name], female   DOB: 1929/10/22, 81 y.o.   MRN: [id number]  HPI patient presents stating she has some nail disease on both feet that are hard for her to walk with and she's tried different trimming without relief of symptoms   Review of Systems  All other systems reviewed and are negative.      Objective:   Physical Exam  Constitutional: She is oriented to person, place, and time.  Cardiovascular: Intact distal pulses.   Musculoskeletal: Normal range of motion.  Neurological: She is oriented to person, place, and time.  Skin: Skin is warm.  Nursing note and vitals reviewed.  neurovascular status intact muscle strength adequate range of motion within normal limits. Patient has yellow thick brittle nailbeds 1-5 both feet that are painful and make it hard for her to wear shoe gear comfortably     Assessment:     Mycotic nail infections with pain 1-5 both feet    Plan:     Reviewed condition debris did nailbeds with no iatrogenic bleeding noted and reappoint to recheck

## 2014-07-27 ENCOUNTER — Other Ambulatory Visit: Payer: Self-pay | Admitting: Internal Medicine

## 2014-07-30 ENCOUNTER — Ambulatory Visit: Payer: Self-pay | Admitting: Internal Medicine

## 2014-08-04 ENCOUNTER — Encounter: Payer: Self-pay | Admitting: Internal Medicine

## 2014-08-04 ENCOUNTER — Ambulatory Visit (INDEPENDENT_AMBULATORY_CARE_PROVIDER_SITE_OTHER): Payer: Medicare Other | Admitting: Internal Medicine

## 2014-08-04 ENCOUNTER — Ambulatory Visit (HOSPITAL_COMMUNITY)
Admission: RE | Admit: 2014-08-04 | Discharge: 2014-08-04 | Disposition: A | Discharge status: Home / Self Care | Payer: Medicare Other | Source: Ambulatory Visit | Attending: Internal Medicine | Admitting: Internal Medicine

## 2014-08-04 VITALS — BP 132/66 | HR 76 | Temp 97.3°F | Resp 16 | Ht 66.0 in | Wt 166.8 lb

## 2014-08-04 DIAGNOSIS — J449 Chronic obstructive pulmonary disease, unspecified: Secondary | ICD-10-CM | POA: Diagnosis not present

## 2014-08-04 DIAGNOSIS — R443 Hallucinations, unspecified: Secondary | ICD-10-CM | POA: Diagnosis not present

## 2014-08-04 DIAGNOSIS — F0391 Unspecified dementia with behavioral disturbance: Secondary | ICD-10-CM

## 2014-08-04 DIAGNOSIS — F03918 Unspecified dementia, unspecified severity, with other behavioral disturbance: Secondary | ICD-10-CM

## 2014-08-04 DIAGNOSIS — R05 Cough: Secondary | ICD-10-CM | POA: Insufficient documentation

## 2014-08-04 DIAGNOSIS — R300 Dysuria: Secondary | ICD-10-CM

## 2014-08-04 DIAGNOSIS — R062 Wheezing: Secondary | ICD-10-CM | POA: Insufficient documentation

## 2014-08-04 MED ORDER — BUDESONIDE 0.5 MG/2ML IN SUSP: 0.5000 mg | Freq: Once | RESPIRATORY_TRACT | Status: DC

## 2014-08-04 MED ORDER — IPRATROPIUM-ALBUTEROL 0.5-2.5 (3) MG/3ML IN SOLN: 3.0000 mL | Freq: Four times a day (QID) | RESPIRATORY_TRACT | Status: DC | PRN

## 2014-08-04 NOTE — Patient Instructions (Signed)
-  Please continue to take 50 mg of seroquel in the morning and then 75 mg of seroquel in the evenings.  - Please start scheduling duoneb treatments every 4-6 hours as needed for wheezing and shortness of breath for the next 3-5 days.  -Please use pulmicort once daily in your nebulizer machine.  You can ask the pharmacist if it is okay to combine the duoneb with this medication.  -Please cut back to only a prednisone morning dose with breakfast.    -I will let you know if we need abx based on Chest Xray or Urine culture as I get this information back

## 2014-08-04 NOTE — Progress Notes (Signed)
   Subjective:    Patient ID: [name] [name], female    DOB: Jan 25, 1930, 81 y.o.   MRN: [id number]  Hypertension Pertinent negatives include no shortness of breath.  Diabetes Pertinent negatives for diabetes include no fatigue.   Patient presents to the office for 24 to 48 hours of increasing wheezing.  She has been having a lot more increase in audible wheezing and some mild shortness of breath.  She has been having a lot of hallucinations recently.  She has recently increased her seroquel to 50 mg in the morning and 75 mg in the evening.  Her relatives noticed that she is having some odor to her urine that is very strong.  She has been hallucinating and has been having full conversations with her eyes closed and also hallucinating with small children.  No aggressive behaviors noted, but she has been very agitated.      Review of Systems  Constitutional: Negative for fever, chills and fatigue.  HENT: Positive for congestion.   Respiratory: Positive for cough and wheezing. Negative for shortness of breath.        Objective:   Physical Exam  Constitutional: She appears well-developed and well-nourished. No distress.  HENT:  Head: Normocephalic.  Mouth/Throat: Oropharynx is clear and moist. No oropharyngeal exudate.  Eyes: Conjunctivae are normal. No scleral icterus.  Neck: Normal range of motion. Neck supple. No JVD present. No thyromegaly present.  Cardiovascular: Normal rate, regular rhythm, normal heart sounds and intact distal pulses.  Exam reveals no gallop and no friction rub.   No murmur heard. Pulmonary/Chest: Effort normal. No respiratory distress. She has wheezes (scattered end expiratory wheeze). She has rales (questionable at right lower lung base). She exhibits no tenderness.  Abdominal: Soft. Bowel sounds are normal. She exhibits no distension and no mass. There is no tenderness. There is no rebound and no guarding.  Musculoskeletal: Normal range of motion.   Lymphadenopathy:    She has no cervical adenopathy.  Neurological: She is alert.  Poor short term memory.  Not able to hold legitimate conversations.   Skin: Skin is warm and dry. She is not diaphoretic.  Psychiatric: She has a normal mood and affect. Her behavior is normal. Judgment and thought content normal.  Nursing note and vitals reviewed.         Assessment & Plan:    1. Wheezing -scheduled nebs -decrease prednisone to 5 mg in am - ipratropium-albuterol (DUONEB) 0.5-2.5 (3) MG/3ML SOLN; Take 3 mLs by nebulization every 6 (six) hours as needed.  Dispense: 360 mL; Refill: 1 - budesonide (PULMICORT) 0.5 MG/2ML nebulizer solution; Take 2 mLs (0.5 mg total) by nebulization once.  Dispense: 2 mL; Refill: 12 - DG Chest 2 View; Future  2. Dysuria -look for possible UTI - Urinalysis, Routine w reflex microscopic (not at [facility]) - Urine culture  3. Dementia with aggressive behavior -cont seroquel on current dose see below  4. Hallucinations -cont seroquel 50 mg in am 75 mg in evening -if no infection, no source of hallucinations consider possibly trying low dose haldol.

## 2014-08-05 ENCOUNTER — Other Ambulatory Visit: Payer: Self-pay | Admitting: Internal Medicine

## 2014-08-05 DIAGNOSIS — R062 Wheezing: Secondary | ICD-10-CM

## 2014-08-06 ENCOUNTER — Other Ambulatory Visit: Payer: Self-pay | Admitting: Internal Medicine

## 2014-08-06 LAB — URINALYSIS, ROUTINE W REFLEX MICROSCOPIC
Bilirubin Urine: NEGATIVE
Glucose, UA: NEGATIVE mg/dL
HGB URINE DIPSTICK: NEGATIVE
Ketones, ur: NEGATIVE mg/dL
NITRITE: NEGATIVE
PROTEIN: NEGATIVE mg/dL
Specific Gravity, Urine: 1.009 (ref 1.005–1.030)
Urobilinogen, UA: 0.2 mg/dL (ref 0.0–1.0)
pH: 6 (ref 5.0–8.0)

## 2014-08-06 LAB — URINALYSIS, MICROSCOPIC ONLY
BACTERIA UA: NONE SEEN
Casts: NONE SEEN
Crystals: NONE SEEN
Squamous Epithelial / LPF: NONE SEEN

## 2014-08-06 LAB — URINE CULTURE
Colony Count: NO GROWTH
ORGANISM ID, BACTERIA: NO GROWTH

## 2014-08-17 ENCOUNTER — Encounter: Payer: Self-pay | Admitting: Internal Medicine

## 2014-08-17 ENCOUNTER — Encounter: Payer: Self-pay | Admitting: Gastroenterology

## 2014-09-06 ENCOUNTER — Other Ambulatory Visit: Payer: Self-pay | Admitting: Internal Medicine

## 2014-09-06 DIAGNOSIS — F209 Schizophrenia, unspecified: Secondary | ICD-10-CM

## 2014-09-06 MED ORDER — HALOPERIDOL 5 MG PO TABS: 5.0000 mg | ORAL_TABLET | Freq: Two times a day (BID) | ORAL | Status: DC

## 2014-09-20 ENCOUNTER — Other Ambulatory Visit: Payer: Self-pay | Admitting: Internal Medicine

## 2014-09-20 DIAGNOSIS — F0280 Dementia in other diseases classified elsewhere without behavioral disturbance: Secondary | ICD-10-CM

## 2014-09-20 DIAGNOSIS — G301 Alzheimer's disease with late onset: Principal | ICD-10-CM

## 2014-09-20 MED ORDER — QUETIAPINE FUMARATE 100 MG PO TABS: ORAL_TABLET | ORAL | Status: DC

## 2014-09-22 ENCOUNTER — Ambulatory Visit (INDEPENDENT_AMBULATORY_CARE_PROVIDER_SITE_OTHER): Payer: Medicare Other | Admitting: Psychiatry

## 2014-09-22 DIAGNOSIS — F339 Major depressive disorder, recurrent, unspecified: Secondary | ICD-10-CM

## 2014-09-22 DIAGNOSIS — F09 Unspecified mental disorder due to known physiological condition: Secondary | ICD-10-CM

## 2014-09-22 MED ORDER — CITALOPRAM HYDROBROMIDE 20 MG PO TABS: 40.0000 mg | ORAL_TABLET | Freq: Every day | ORAL | Status: DC

## 2014-09-22 MED ORDER — MEMANTINE HCL ER 28 MG PO CP24: 28.0000 mg | ORAL_CAPSULE | Freq: Every morning | ORAL | Status: DC

## 2014-09-22 MED ORDER — MEMANTINE HCL 28 X 5 MG & 21 X 10 MG PO TABS
ORAL_TABLET | ORAL | Status: DC
Start: 2014-09-22 — End: 2015-02-10

## 2014-09-22 MED ORDER — LORAZEPAM 0.5 MG PO TABS: ORAL_TABLET | ORAL | Status: DC

## 2014-09-22 NOTE — Progress Notes (Signed)
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 T2_NIDDM w/Stage 3 CKD (GFR 54 ml/min) [E11.29] 11/27/2013  . COPD with chronic bronchitis [J44.9] 11/27/2013  . Asthma with exacerbation [J45.901] 11/27/2013  . Medication management [Z79.899] 06/25/2013  . OBS [F07.9] 05/22/2013  . Vitamin D deficiency [E55.9]   . Dementia [F03.90]   . Depression [F32.9]   . Postinflammatory pulmonary fibrosis [J84.10] 04/14/2007  . Hypothyroidism [E03.9] 01/24/2007  . Mixed hyperlipidemia [E78.2] 01/24/2007  . Essential hypertension [I10] 01/24/2007  . Asthma [J45.909] 01/24/2007  . GERD [K21.9] 01/24/2007  . Irritable bowel syndrome [K58.9] 01/24/2007   History of Present Illness:  This patient is an 81 year old white female widow who is brought here by her son [name] [name] is a para return he and her daughter [name] [name]. They brought her because she's having progressive increasing confusion and mild nighttime agitation. The patient has been confused and had significant memory problems over the last 3 years. She is very fortunate that she lives at home and different family member stay with her all time. She's never home alone. She has a niece is with her much of the time. At times the patient gets agitated at night when she disagrees with people and even she'll scream and yell very transiently. Occasionally she'll try to leave but she is easily redirected. She is never violent or aggressive. The patient is been a widow since 49. She has 5 adult children and many grandchildren and many great grandchildren. The patient needs assistance with many of  her basic ADLs. Fortunately she can still told her herself and she does feed herself. She needs assistance dressing and doing hygiene. The patient shows no evidence of depression. She is actually eating fairly well. She has sleep that's disrupted at times at times he doesn't go to bed at all. The patient is never made a suicide attempt and is not suicidal. He still enjoys some things like TV music and going on family outings. She also enjoys talking to the person is caring for her. There is no evidence of any history of alcohol now or ever. The patient does not experience auditory hallucinations but is observed to be experiencing non-distressing visual hallucinations. She's never paranoid. She's never experienced an episode of major depression nor of mania. She has no symptoms consistent with any specific anxiety disorder like generalized anxiety syrup panic or obsessive-compulsive disorder. The patient has multiple medical problems including diabetes hypertension COPD and thyroid disease. In today's interview the patient denies shortness of breath chest pain or any neurological symptoms. Fortunately she's not experience any falls. She is alert friendly and engaging. This patient does not smoke cigarettes. She has a high school education. The patient is never been in a psychiatric hospital and has never been evaluated by a psychiatrist. She's never been in therapy. She's on a number of psychiatric medications including Celexa 40 mg and Seroquel 50 g in the morning 100 mg at night. She's been on Haldol but past which has been removed and Klonopin oversedated her. Today the patient was interviewed alone then with her son [name] [name] and then with her daughter [name] [name]. Today we together came up with a treatment plan. Elements:  Associated Signs/Symptoms: Depression Symptoms:  difficulty concentrating, (Hypo) Manic Symptoms:   Anxiety Symptoms:   Psychotic Symptoms:   PTSD Symptoms:   Past Medical History:  Past  Medical History  Diagnosis Date  . Asthma   . Type II or unspecified type diabetes mellitus without mention of complication, not stated as uncontrolled   . Hyperlipidemia   . Chronic headaches   . Hypertension   . Pulmonary fibrosis     Chronic granulomatous changes on CT chest, minimal mediastinal LAN 3/09  . COPD (chronic obstructive pulmonary disease)   . Vitamin D deficiency   . B12 deficiency   . Allergy   . Dementia   . Depression   . Hypothyroidism   . CKD (chronic kidney disease) stage 3, GFR 30-59 ml/min     Past Surgical History  Procedure Laterality Date  . Appendectomy    . Thyroidectomy    . Tubal ligation    . Esophagogastroduodenoscopy  2012    Gastritis  . Breast biopsy      right breast/benign  . Endometrial biopsy  1998    negative   Family History:  Family History  Problem Relation Age of Onset  . Asthma Sister   . Cancer Sister     breast, colon  . Diabetes Mother   . Cancer Mother     breast  . Diabetes Sister   . Cancer Sister 68    breast  . Heart disease Father   . Hypertension Father   . Cancer Brother     leukemia  . Cancer Brother     lung  . Hypertension Brother   . Hyperlipidemia Brother    Social History:   Social History   Social History  . Marital Status: Widowed    Spouse Name: N/A  . Number of Children: N/A  . Years of Education: N/A   Social History Main Topics  . Smoking status: Never Smoker   . Smokeless tobacco: Never Used  . Alcohol Use: No  . Drug Use: No  . Sexual Activity: Not on file   Other Topics Concern  . Not on file   Social History Narrative   Additional Social History: Above  Musculoskeletal: Strength & Muscle Tone: abnormal Gait & Station: unable to stand Patient leans: Right  Psychiatric Specialty Exam: HPI  ROS  There were no vitals taken for this visit.There is no weight on file to calculate BMI.  General Appearance: Casual  Eye Contact:  Fair  Speech:  Normal Rate  Volume:   Decreased  Mood:  Euthymic  Affect:  Appropriate  Thought Process:  Coherent  Orientation:  Disoriented 3   Thought Content:  NA  Suicidal Thoughts:  No  Homicidal Thoughts:  No  Memory: Very impaired   Judgement:  Fair  Insight:  Lacking  Psychomotor Activity:  Decreased  Concentration:  Poor  Recall:  [place] of Knowledge:Poor  Language: Poor  Akathisia:  No  Handed:  Right  AIMS (if indicated):   Assets:  Desire for Improvement  ADL's:  Impaired  Cognition: Impaired,  Severe  Sleep:     Is the patient at risk to self?  No. Has the patient been a risk to self in the past 6 months?  No. Has the patient been a risk to self within the distant past?  No. Is the patient a risk to others?  No. Has the patient been a risk to others in the past 6 months?  No.  Has the patient been a risk to others within the distant past?  No.  Allergies:   Allergies  Allergen Reactions  . Iron     GI  . Meloxicam Other (See Comments)    Abd pain  . Meperidine Hcl Other (See Comments)    agitated  . Pravastatin     myalgias  . Zolpidem Tartrate Other (See Comments)    confusion  . Codeine Palpitations    "heart raced"   Current Medications: Current Outpatient Prescriptions  Medication Sig Dispense Refill  . Ascorbic Acid (VITAMIN C) 1000 MG tablet Take 1,000 mg by mouth daily.    Marland Kitchen aspirin 81 MG EC tablet Take 81 mg by mouth every Monday, Wednesday, and Friday.     . benazepril (LOTENSIN) 10 MG tablet Take 1 tablet (10 mg total) by mouth daily. 30 tablet 2  . budesonide (PULMICORT) 0.5 MG/2ML nebulizer solution Take 2 mLs (0.5 mg total) by nebulization once. 2 mL 12  . Cholecalciferol (VITAMIN D) 2000 UNITS CAPS Take 2,000 Units by mouth 2 (two) times daily.     . citalopram (CELEXA) 20 MG tablet Take 2 tablets (40 mg total) by mouth daily. 30 tablet 5  . clopidogrel (PLAVIX) 75 MG tablet Take 75 mg by mouth See admin instructions. Take 75 mg by mouth every Tuesday, Thursday and  saturday    . haloperidol (HALDOL) 5 MG tablet Take 1 tablet (5 mg total) by mouth 2 (two) times daily. 30 tablet 1  . ipratropium-albuterol (DUONEB) 0.5-2.5 (3) MG/3ML SOLN Take 3 mLs by nebulization every 6 (six) hours as needed. 360 mL 1  . levothyroxine (SYNTHROID, LEVOTHROID) 75 MCG tablet take 1 tablet by mouth once daily 90 tablet 3  . loratadine (CLARITIN) 10 MG tablet Take 10 mg by mouth every morning.     Marland Kitchen LORazepam (ATIVAN) 0.5 MG tablet 1  qday  Prn for agitation 30 tablet 3  . magnesium gluconate (MAGONATE) 500 MG tablet Take 500 mg by mouth daily.    . memantine (NAMENDA TITRATION PAK) tablet pack 5 mg/day for =1 week; 5 mg twice daily for =1 week; 15 mg/day given in 5 mg and 10 mg separated doses for =1 week; then 10 mg twice daily 49 tablet 12  . memantine (NAMENDA XR) 28 MG CP24 24 hr capsule Take 1 capsule (28 mg total) by mouth every morning. 30 capsule 4  . metFORMIN (GLUCOPHAGE-XR) 500 MG 24 hr tablet take 1 to 2 tablets by mouth twice a day after meals 120 tablet 99  . Multiple Vitamins-Minerals (CENTRUM SILVER PO) Take 1 tablet by mouth daily.     . Omega-3 Fatty Acids (FISH OIL PO) Take 1,000 mg by mouth every other day.    . predniSONE (DELTASONE) 5 MG tablet take 1 tablet by mouth three times a day as directed BY PRESCRIBER 100 tablet 3  . Probiotic Product (RESTORA PO) Take 1 tablet by mouth daily.     . QUEtiapine (SEROQUEL) 100 MG tablet Take 1 tab 3 x day or as directed 90 tablet 5  . verapamil (CALAN-SR) 240 MG CR tablet   0   No current facility-administered medications for this visit.    Previous Psychotropic Medications: Yes   Substance Abuse History in the last 12 months:  No.  Consequences of Substance Abuse: Negative  Medical Decision Making:  New problem, with additional work up planned  Treatment Plan Summary: At this time this patient's problems include nighttime agitation and confusion.  I find no evidence of depression. I find no evidence of  distressing psychosis. At this time we she'll deal with her confusion by beginning her on Namenda for her memory. Her second problem is her agitation. Will go ahead and reduce her Celexa to more appropriate dose of 20 mg and begin titrating her off Seroquel. Because she is not overtly psychotic or violent Seroquel is not quite indicated. It is noted that she has multiple cardiovascular risk factors including hypertension diabetes and therefore we she'll head towards getting her off the Seroquel completely. We should go slow. For her agitation she she'll begin on Ativan 0.5 mg when necessary. Her biggest intervention however will be to refer her to the a Center in [place] for daycare. At this time the family was given education about indications when this patient possibly should be considered for skilled nursing. This is occluded she stopped eating which has not where she's become completely incontinent of urine and blood and bowel. Act this time the patient is functioning fairly well as long she has around-the-clock 24-hour care. This patient to return to see me in 2 months.     Schyler Butikofer, [place] 8/24/20162:18 PM

## 2014-09-23 ENCOUNTER — Other Ambulatory Visit: Payer: Self-pay | Admitting: Internal Medicine

## 2014-09-23 MED ORDER — CITALOPRAM HYDROBROMIDE 20 MG PO TABS: ORAL_TABLET | ORAL | Status: DC

## 2014-09-24 ENCOUNTER — Other Ambulatory Visit: Payer: Self-pay | Admitting: Internal Medicine

## 2014-10-12 ENCOUNTER — Ambulatory Visit: Payer: Medicare Other | Admitting: Podiatry

## 2014-10-19 ENCOUNTER — Encounter: Payer: Self-pay | Admitting: Podiatry

## 2014-10-19 ENCOUNTER — Ambulatory Visit (INDEPENDENT_AMBULATORY_CARE_PROVIDER_SITE_OTHER): Payer: Medicare Other | Admitting: Podiatry

## 2014-10-19 DIAGNOSIS — M79606 Pain in leg, unspecified: Secondary | ICD-10-CM | POA: Diagnosis not present

## 2014-10-19 DIAGNOSIS — B351 Tinea unguium: Secondary | ICD-10-CM

## 2014-10-19 NOTE — Progress Notes (Signed)
Patient ID: [name] [name], female   DOB: [birth year], 81 y.o.   MRN: [id number] Complaint:  Visit Type: Patient returns to my office for continued preventative foot care services. Complaint: Patient states" my nails have grown long and thick and become painful to walk and wear shoes" Patient has been diagnosed with DM with no foot complications. The patient presents for preventative foot care services. No changes to ROS  Podiatric Exam: Vascular: dorsalis pedis and posterior tibial pulses are palpable bilateral. Capillary return is immediate. Temperature gradient is WNL. Skin turgor WNL  Sensorium: Normal Semmes Weinstein monofilament test. Normal tactile sensation bilaterally. Nail Exam: Pt has thick disfigured discolored nails with subungual debris noted bilateral entire nail hallux through fifth toenails Ulcer Exam: There is no evidence of ulcer or pre-ulcerative changes or infection. Orthopedic Exam: Muscle tone and strength are WNL. No limitations in general ROM. No crepitus or effusions noted. Foot type and digits show no abnormalities. Bony prominences are unremarkable. Skin: No Porokeratosis. No infection or ulcers  Diagnosis:  Onychomycosis, , Pain in right toe, pain in left toes  Treatment & Plan Procedures and Treatment: Consent by patient was obtained for treatment procedures. The patient understood the discussion of treatment and procedures well. All questions were answered thoroughly reviewed. Debridement of mycotic and hypertrophic toenails, 1 through 5 bilateral and clearing of subungual debris. No ulceration, no infection noted.  Return Visit-Office Procedure: Patient instructed to return to the office for a follow up visit 10 weeks  for continued evaluation and treatment.

## 2014-11-24 ENCOUNTER — Ambulatory Visit (HOSPITAL_COMMUNITY): Payer: Self-pay | Admitting: Psychiatry

## 2014-12-20 ENCOUNTER — Encounter: Payer: Self-pay | Admitting: Internal Medicine

## 2014-12-20 ENCOUNTER — Ambulatory Visit (INDEPENDENT_AMBULATORY_CARE_PROVIDER_SITE_OTHER): Payer: Medicare Other | Admitting: Internal Medicine

## 2014-12-20 VITALS — BP 138/72 | HR 76 | Temp 97.5°F | Resp 16 | Ht 66.0 in | Wt 166.2 lb

## 2014-12-20 DIAGNOSIS — I10 Essential (primary) hypertension: Secondary | ICD-10-CM | POA: Diagnosis not present

## 2014-12-20 DIAGNOSIS — E1129 Type 2 diabetes mellitus with other diabetic kidney complication: Secondary | ICD-10-CM | POA: Diagnosis not present

## 2014-12-20 DIAGNOSIS — E559 Vitamin D deficiency, unspecified: Secondary | ICD-10-CM | POA: Diagnosis not present

## 2014-12-20 DIAGNOSIS — E782 Mixed hyperlipidemia: Secondary | ICD-10-CM

## 2014-12-20 DIAGNOSIS — E1122 Type 2 diabetes mellitus with diabetic chronic kidney disease: Secondary | ICD-10-CM

## 2014-12-20 DIAGNOSIS — E039 Hypothyroidism, unspecified: Secondary | ICD-10-CM | POA: Diagnosis not present

## 2014-12-20 DIAGNOSIS — N182 Chronic kidney disease, stage 2 (mild): Secondary | ICD-10-CM

## 2014-12-20 DIAGNOSIS — Z79899 Other long term (current) drug therapy: Secondary | ICD-10-CM | POA: Diagnosis not present

## 2014-12-20 NOTE — Progress Notes (Signed)
Patient ID: [name] [name], female   DOB: [birth year], 81 y.o.   MRN: [id number]  Assessment and Plan:  Hypertension:  -Continue medication,  -monitor blood pressure at home.  -Continue DASH diet.   -Reminder to go to the ER if any CP, SOB, nausea, dizziness, severe HA, changes vision/speech, left arm numbness and tingling, and jaw pain.  Cholesterol: -Continue diet and exercise.  -Check cholesterol.   Pre-diabetes: -Continue diet and exercise.  -Check A1C  Vitamin D Def: -check level -continue medications.   Dementia w/ behavioral disturbance -as good as it has been in a while -cont to follow with Geripsych  Continue diet and meds as discussed. Further disposition pending results of labs.  HPI 81 y.o. female  presents for 3 month follow up with hypertension, hyperlipidemia, prediabetes and vitamin D.   Her blood pressure has been controlled at home, today their BP is BP: 138/72 mmHg.   She does workout. She denies chest pain, shortness of breath, dizziness.   She is on cholesterol medication and denies myalgias. Her cholesterol is at goal. The cholesterol last visit was:   Lab Results  Component Value Date   CHOL 290* 06/16/2014   HDL 99 06/16/2014   LDLCALC 167* 06/16/2014   TRIG 118 06/16/2014   CHOLHDL 2.9 06/16/2014     She has been working on diet and exercise for prediabetes, and denies foot ulcerations, hyperglycemia, hypoglycemia , increased appetite, nausea, paresthesia of the feet, polydipsia, polyuria, visual disturbances, vomiting and weight loss. Last A1C in the office was:  Lab Results  Component Value Date   HGBA1C 6.4* 06/16/2014    Patient is on Vitamin D supplement.  Lab Results  Component Value Date   VD25OH 95* 06/25/2013     Patient's daughter reports that she has done very well since we adjusted medications through geriatric psychiatry.  Memory is as good as it has been in a long time.   Her back is still bothering her on the right lower  side.  She reports that it hurts more later on during the daytime. She reports that she is still able to do stuff and is able to do her chores.    Current Medications:  Current Outpatient Prescriptions on File Prior to Visit  Medication Sig Dispense Refill  . Ascorbic Acid (VITAMIN C) 1000 MG tablet Take 1,000 mg by mouth daily.    Marland Kitchen aspirin 81 MG EC tablet Take 81 mg by mouth every Monday, Wednesday, and Friday.     . benazepril (LOTENSIN) 10 MG tablet Take 1 tablet (10 mg total) by mouth daily. 30 tablet 2  . budesonide (PULMICORT) 0.5 MG/2ML nebulizer solution Take 2 mLs (0.5 mg total) by nebulization once. 2 mL 12  . Cholecalciferol (VITAMIN D) 2000 UNITS CAPS Take 2,000 Units by mouth 2 (two) times daily.     . citalopram (CELEXA) 20 MG tablet Take 1 tablet daily 30 tablet 5  . clopidogrel (PLAVIX) 75 MG tablet Take 75 mg by mouth See admin instructions. Take 75 mg by mouth every Tuesday, Thursday and saturday    . haloperidol (HALDOL) 5 MG tablet Take 1 tablet (5 mg total) by mouth 2 (two) times daily. 30 tablet 1  . ipratropium-albuterol (DUONEB) 0.5-2.5 (3) MG/3ML SOLN Take 3 mLs by nebulization every 6 (six) hours as needed. 360 mL 1  . levothyroxine (SYNTHROID, LEVOTHROID) 75 MCG tablet take 1 tablet by mouth once daily 90 tablet 3  . loratadine (CLARITIN) 10 MG tablet Take  10 mg by mouth every morning.     Marland Kitchen LORazepam (ATIVAN) 0.5 MG tablet 1  qday  Prn for agitation 30 tablet 3  . magnesium gluconate (MAGONATE) 500 MG tablet Take 500 mg by mouth daily.    . memantine (NAMENDA TITRATION PAK) tablet pack 5 mg/day for =1 week; 5 mg twice daily for =1 week; 15 mg/day given in 5 mg and 10 mg separated doses for =1 week; then 10 mg twice daily 49 tablet 12  . memantine (NAMENDA XR) 28 MG CP24 24 hr capsule Take 1 capsule (28 mg total) by mouth every morning. 30 capsule 4  . metFORMIN (GLUCOPHAGE-XR) 500 MG 24 hr tablet take 1 to 2 tablets by mouth twice a day after meals 120 tablet 99  .  Multiple Vitamins-Minerals (CENTRUM SILVER PO) Take 1 tablet by mouth daily.     . Omega-3 Fatty Acids (FISH OIL PO) Take 1,000 mg by mouth every other day.    . predniSONE (DELTASONE) 5 MG tablet take 1 tablet by mouth three times a day as directed BY PRESCRIBER 100 tablet 3  . Probiotic Product (RESTORA PO) Take 1 tablet by mouth daily.     . QUEtiapine (SEROQUEL) 100 MG tablet Take 1 tab 3 x day or as directed 90 tablet 5  . verapamil (CALAN-SR) 240 MG CR tablet   0   No current facility-administered medications on file prior to visit.    Medical History:  Past Medical History  Diagnosis Date  . Asthma   . Type II or unspecified type diabetes mellitus without mention of complication, not stated as uncontrolled   . Hyperlipidemia   . Chronic headaches   . Hypertension   . Pulmonary fibrosis (HCC)     Chronic granulomatous changes on CT chest, minimal mediastinal LAN 3/09  . COPD (chronic obstructive pulmonary disease) ([place])   . Vitamin D deficiency   . B12 deficiency   . Allergy   . Dementia   . Depression   . Hypothyroidism   . CKD (chronic kidney disease) stage 3, GFR 30-59 ml/min     Allergies:  Allergies  Allergen Reactions  . Iron     GI  . Meloxicam Other (See Comments)    Abd pain  . Meperidine Hcl Other (See Comments)    agitated  . Pravastatin     myalgias  . Zolpidem Tartrate Other (See Comments)    confusion  . Codeine Palpitations    "heart raced"     Review of Systems:  Review of Systems  Constitutional: Negative for fever, chills and malaise/fatigue.  HENT: Negative for congestion, ear pain and sore throat.   Respiratory: Positive for wheezing. Negative for cough and shortness of breath.   Cardiovascular: Negative for chest pain, palpitations and leg swelling.  Gastrointestinal: Negative for diarrhea, constipation, blood in stool and melena.  Genitourinary: Negative.   Musculoskeletal: Positive for back pain.  Neurological: Negative for  dizziness, sensory change, loss of consciousness and headaches.  Psychiatric/Behavioral: Negative for depression. The patient is not nervous/anxious and does not have insomnia.     Family history- Review and unchanged  Social history- Review and unchanged  Physical Exam: BP 138/72 mmHg  Pulse 76  Temp(Src) 97.5 F (36.4 C)  Resp 16  Ht 5\' 6"  (1.676 m)  Wt 166 lb 3.2 oz (75.388 kg)  BMI 26.84 kg/m2 Wt Readings from Last 3 Encounters:  12/20/14 166 lb 3.2 oz (75.388 kg)  08/04/14 166 lb 12.8 oz (  75.66 kg)  06/30/14 163 lb (73.936 kg)    General Appearance: Well nourished well developed, in no apparent distress. Eyes: PERRLA, EOMs, conjunctiva no swelling or erythema ENT/Mouth: Ear canals normal without obstruction, swelling, erythma, discharge.  TMs normal bilaterally.  Oropharynx moist, clear, without exudate, or postoropharyngeal swelling. Neck: Supple, thyroid normal,no cervical adenopathy  Respiratory: Respiratory effort normal, Breath sounds clear A&P without rhonchi, wheeze, or rale.  No retractions, no accessory usage. Cardio: RRR with no MRGs. Brisk peripheral pulses without edema.  Abdomen: Soft, + BS,  Non tender, no guarding, rebound, hernias, masses. Musculoskeletal: Full ROM, 5/5 strength, Normal gait Skin: Warm, dry without rashes, lesions, ecchymosis.  Neuro: Awake and oriented X 3, Cranial nerves intact. Normal muscle tone, no cerebellar symptoms. Psych: Normal affect poor short term memory and judgement   Starlyn Skeans, PA-C 5:01 PM [facility]

## 2014-12-21 LAB — BASIC METABOLIC PANEL WITH GFR
BUN: 14 mg/dL (ref 7–25)
CALCIUM: 9.1 mg/dL (ref 8.6–10.4)
CHLORIDE: 97 mmol/L — AB (ref 98–110)
CO2: 29 mmol/L (ref 20–31)
Creat: 0.88 mg/dL (ref 0.60–0.88)
GFR, EST NON AFRICAN AMERICAN: 61 mL/min (ref >=60)
GFR, Est African American: 70 mL/min (ref >=60)
Glucose, Bld: 145 mg/dL — ABNORMAL HIGH (ref 65–99)
Potassium: 4.1 mmol/L (ref 3.5–5.3)
Sodium: 134 mmol/L — ABNORMAL LOW (ref 135–146)

## 2014-12-21 LAB — HEMOGLOBIN A1C
Hgb A1c MFr Bld: 6.4 % — ABNORMAL HIGH (ref <5.7)
Mean Plasma Glucose: 137 mg/dL — ABNORMAL HIGH (ref <117)

## 2014-12-21 LAB — HEPATIC FUNCTION PANEL
ALT: 19 U/L (ref 6–29)
AST: 21 U/L (ref 10–35)
Albumin: 4.1 g/dL (ref 3.6–5.1)
Alkaline Phosphatase: 56 U/L (ref 33–130)
BILIRUBIN DIRECT: 0.1 mg/dL (ref <=0.2)
BILIRUBIN INDIRECT: 0.2 mg/dL (ref 0.2–1.2)
Total Bilirubin: 0.3 mg/dL (ref 0.2–1.2)
Total Protein: 6.4 g/dL (ref 6.1–8.1)

## 2014-12-21 LAB — CBC WITH DIFFERENTIAL/PLATELET
Basophils Absolute: 0 10*3/uL (ref 0.0–0.1)
Basophils Relative: 0 % (ref 0–1)
EOS ABS: 0.1 10*3/uL (ref 0.0–0.7)
Eosinophils Relative: 1 % (ref 0–5)
HCT: 35.9 % — ABNORMAL LOW (ref 36.0–46.0)
HEMOGLOBIN: 11.8 g/dL — AB (ref 12.0–15.0)
Lymphocytes Relative: 12 % (ref 12–46)
Lymphs Abs: 0.7 10*3/uL (ref 0.7–4.0)
MCH: 30.6 pg (ref 26.0–34.0)
MCHC: 32.9 g/dL (ref 30.0–36.0)
MCV: 93.2 fL (ref 78.0–100.0)
MPV: 8.8 fL (ref 8.6–12.4)
Monocytes Absolute: 0.5 10*3/uL (ref 0.1–1.0)
Monocytes Relative: 9 % (ref 3–12)
NEUTROS ABS: 4.6 10*3/uL (ref 1.7–7.7)
Neutrophils Relative %: 78 % — ABNORMAL HIGH (ref 43–77)
Platelets: 309 10*3/uL (ref 150–400)
RBC: 3.85 MIL/uL — AB (ref 3.87–5.11)
RDW: 13.2 % (ref 11.5–15.5)
WBC: 5.9 10*3/uL (ref 4.0–10.5)

## 2014-12-21 LAB — LIPID PANEL
CHOL/HDL RATIO: 3 ratio (ref <=5.0)
Cholesterol: 272 mg/dL — ABNORMAL HIGH (ref 125–200)
HDL: 90 mg/dL (ref >=46)
LDL CALC: 140 mg/dL — AB (ref <130)
Triglycerides: 209 mg/dL — ABNORMAL HIGH (ref <150)
VLDL: 42 mg/dL — AB (ref <30)

## 2014-12-21 LAB — TSH: TSH: 0.18 u[IU]/mL — AB (ref 0.350–4.500)

## 2015-01-04 ENCOUNTER — Other Ambulatory Visit: Payer: Self-pay | Admitting: Internal Medicine

## 2015-01-04 DIAGNOSIS — G301 Alzheimer's disease with late onset: Principal | ICD-10-CM

## 2015-01-04 DIAGNOSIS — R451 Restlessness and agitation: Secondary | ICD-10-CM

## 2015-01-04 DIAGNOSIS — F0280 Dementia in other diseases classified elsewhere without behavioral disturbance: Secondary | ICD-10-CM

## 2015-01-04 MED ORDER — LORAZEPAM 0.5 MG PO TABS: ORAL_TABLET | ORAL | Status: DC

## 2015-01-04 MED ORDER — QUETIAPINE FUMARATE 50 MG PO TABS: ORAL_TABLET | ORAL | Status: DC

## 2015-01-05 ENCOUNTER — Other Ambulatory Visit: Payer: Self-pay | Admitting: *Deleted

## 2015-01-05 MED ORDER — QUETIAPINE FUMARATE 50 MG PO TABS: ORAL_TABLET | ORAL | Status: DC

## 2015-01-06 ENCOUNTER — Other Ambulatory Visit: Payer: Self-pay | Admitting: Internal Medicine

## 2015-01-10 ENCOUNTER — Other Ambulatory Visit: Payer: Self-pay | Admitting: Internal Medicine

## 2015-02-03 ENCOUNTER — Other Ambulatory Visit: Payer: Self-pay | Admitting: Internal Medicine

## 2015-02-04 ENCOUNTER — Ambulatory Visit (INDEPENDENT_AMBULATORY_CARE_PROVIDER_SITE_OTHER): Payer: Medicare Other | Admitting: *Deleted

## 2015-02-04 DIAGNOSIS — Z111 Encounter for screening for respiratory tuberculosis: Secondary | ICD-10-CM

## 2015-02-04 DIAGNOSIS — Z23 Encounter for immunization: Secondary | ICD-10-CM | POA: Diagnosis not present

## 2015-02-08 LAB — TB SKIN TEST
Induration: 0 mm
TB Skin Test: NEGATIVE

## 2015-02-10 ENCOUNTER — Ambulatory Visit (INDEPENDENT_AMBULATORY_CARE_PROVIDER_SITE_OTHER): Payer: Medicare Other | Admitting: Internal Medicine

## 2015-02-10 ENCOUNTER — Encounter: Payer: Self-pay | Admitting: Internal Medicine

## 2015-02-10 VITALS — BP 126/64 | HR 80 | Temp 97.8°F | Resp 16 | Ht 66.0 in | Wt 163.0 lb

## 2015-02-10 DIAGNOSIS — S90425A Blister (nonthermal), left lesser toe(s), initial encounter: Secondary | ICD-10-CM | POA: Diagnosis not present

## 2015-02-10 DIAGNOSIS — E039 Hypothyroidism, unspecified: Secondary | ICD-10-CM | POA: Diagnosis not present

## 2015-02-10 DIAGNOSIS — R300 Dysuria: Secondary | ICD-10-CM | POA: Diagnosis not present

## 2015-02-10 NOTE — Addendum Note (Signed)
Addended by: Starlyn Skeans A on: 02/10/2015 04:47 PM   Modules accepted: Orders

## 2015-02-10 NOTE — Progress Notes (Signed)
   Subjective:    Patient ID: [name] [name], female    DOB: January 11, 1930, 82 y.o.   MRN: [id number]  HPI  Patient presents with her daughter in law for evaluation of a sore on her toe.  Her daughter in law reports that the sore appeared around the 9th of this month.  No injury that they are aware of.  It on her left foot and her daughter in law reports that she tends to drag that foot sometimes when she walks.  No discharge or drainage.  She did get a new pair of shoes recently.    She also needs her TSH rechecked as the dose was changed back in November.  She is currently taking 1 tablet on weekdays and a 1/2 tablet on weekends.    Review of Systems  Constitutional: Negative for fever, chills and fatigue.  Respiratory: Negative for chest tightness and shortness of breath.   Cardiovascular: Negative for chest pain, palpitations and leg swelling.  Gastrointestinal: Negative for nausea, vomiting, diarrhea, constipation, abdominal distention and rectal pain.  Genitourinary: Negative for dysuria, urgency, frequency, hematuria and difficulty urinating.       Objective:   Physical Exam  Constitutional: She appears well-developed and well-nourished. No distress.  HENT:  Head: Normocephalic.  Mouth/Throat: Oropharynx is clear and moist. No oropharyngeal exudate.  Eyes: Conjunctivae are normal. No scleral icterus.  Neck: Normal range of motion. Neck supple. No JVD present. No thyromegaly present.  Cardiovascular: Normal rate, regular rhythm, normal heart sounds and intact distal pulses.  Exam reveals no gallop and no friction rub.   No murmur heard. Pulmonary/Chest: Effort normal and breath sounds normal. No respiratory distress. She has no wheezes. She has no rales. She exhibits no tenderness.  Abdominal: Soft. Bowel sounds are normal. She exhibits no distension and no mass. There is no tenderness. There is no rebound and no guarding.  Musculoskeletal: Normal range of motion.  Lymphadenopathy:     She has no cervical adenopathy.  Neurological: She is alert.  Disoriented to time, and place.  Oriented to person.  Cannot answer questions appropriately.  Skin: Skin is warm and dry. She is not diaphoretic.  1 cm irregular blood blister to the tip of the left great toe without discharge.    Nursing note and vitals reviewed.   Filed Vitals:   02/10/15 1426  BP: 126/64  Pulse: 80  Temp: 97.8 F (36.6 C)  Resp: 16   Left great toe blood blister perforated with 21 gauge needle with expression of serosanguinous fluids.      Assessment & Plan:    1. Hypothyroidism, unspecified hypothyroidism type  - TSH  2. Blister of toe, left, initial encounter  -drained here in the office.  Keep clean and dry -likely from new shoes

## 2015-02-11 ENCOUNTER — Other Ambulatory Visit: Payer: Medicare Other

## 2015-02-11 LAB — TSH: TSH: 0.632 u[IU]/mL (ref 0.350–4.500)

## 2015-02-15 ENCOUNTER — Other Ambulatory Visit: Payer: Self-pay | Admitting: Internal Medicine

## 2015-02-15 DIAGNOSIS — R451 Restlessness and agitation: Secondary | ICD-10-CM

## 2015-02-15 MED ORDER — LORAZEPAM 0.5 MG PO TABS: ORAL_TABLET | ORAL | Status: DC

## 2015-02-15 MED ORDER — LORAZEPAM 0.5 MG PO TABS: 0.5000 mg | ORAL_TABLET | Freq: Two times a day (BID) | ORAL | Status: DC

## 2015-02-18 DIAGNOSIS — N390 Urinary tract infection, site not specified: Secondary | ICD-10-CM | POA: Diagnosis not present

## 2015-02-22 ENCOUNTER — Other Ambulatory Visit: Payer: Self-pay | Admitting: Internal Medicine

## 2015-02-25 ENCOUNTER — Encounter: Payer: Self-pay | Admitting: Podiatry

## 2015-02-25 ENCOUNTER — Ambulatory Visit (INDEPENDENT_AMBULATORY_CARE_PROVIDER_SITE_OTHER): Payer: Medicare Other | Admitting: Podiatry

## 2015-02-25 DIAGNOSIS — M79606 Pain in leg, unspecified: Secondary | ICD-10-CM

## 2015-02-25 DIAGNOSIS — B351 Tinea unguium: Secondary | ICD-10-CM | POA: Diagnosis not present

## 2015-02-25 NOTE — Progress Notes (Signed)
Patient ID: [name] [name], female   DOB: [birth year], 82 y.o.   MRN: [id number] Complaint:  Visit Type: Patient returns to my office for continued preventative foot care services. Complaint: Patient states" my nails have grown long and thick and become painful to walk and wear shoes" Patient has been diagnosed with DM with no foot complications. The patient presents for preventative foot care services. No changes to ROS Blackened area on tip left hallux.  No pain noted.  Podiatric Exam: Vascular: dorsalis pedis and posterior tibial pulses are palpable bilateral. Capillary return is immediate. Temperature gradient is WNL. Skin turgor WNL  Sensorium: Normal Semmes Weinstein monofilament test. Normal tactile sensation bilaterally. Nail Exam: Pt has thick disfigured discolored nails with subungual debris noted bilateral entire nail hallux through fifth toenails Ulcer Exam: There is no evidence of ulcer or pre-ulcerative changes or infection. Orthopedic Exam: Muscle tone and strength are WNL. No limitations in general ROM. No crepitus or effusions noted. Foot type and digits show no abnormalities. Bony prominences are unremarkable. Skin: No Porokeratosis. No infection or ulcers.  Blackened area with no redness or swelling or infection.   Diagnosis:  Onychomycosis, , Pain in right toe, pain in left toes  Treatment & Plan Procedures and Treatment: Consent by patient was obtained for treatment procedures. The patient understood the discussion of treatment and procedures well. All questions were answered thoroughly reviewed. Debridement of mycotic and hypertrophic toenails, 1 through 5 bilateral and clearing of subungual debris. No ulceration, no infection noted. Healing skin lesion left hallux. Return Visit-Office Procedure: Patient instructed to return to the office for a follow up visit 10 weeks  for continued evaluation and treatment.  Gardiner Barefoot DPM

## 2015-02-28 ENCOUNTER — Other Ambulatory Visit: Payer: Self-pay | Admitting: *Deleted

## 2015-02-28 ENCOUNTER — Other Ambulatory Visit: Payer: Self-pay | Admitting: Internal Medicine

## 2015-02-28 DIAGNOSIS — G4700 Insomnia, unspecified: Secondary | ICD-10-CM

## 2015-02-28 MED ORDER — HALOPERIDOL 5 MG PO TABS: ORAL_TABLET | ORAL | Status: DC

## 2015-02-28 MED ORDER — TRAZODONE HCL 150 MG PO TABS: ORAL_TABLET | ORAL | Status: DC

## 2015-02-28 MED ORDER — LORAZEPAM 1 MG PO TABS: ORAL_TABLET | ORAL | Status: DC

## 2015-03-02 ENCOUNTER — Other Ambulatory Visit: Payer: Self-pay | Admitting: Internal Medicine

## 2015-03-02 MED ORDER — TRAZODONE HCL 150 MG PO TABS: ORAL_TABLET | ORAL | Status: DC

## 2015-03-02 MED ORDER — HALOPERIDOL 5 MG PO TABS: ORAL_TABLET | ORAL | Status: DC

## 2015-03-03 DIAGNOSIS — M79609 Pain in unspecified limb: Secondary | ICD-10-CM | POA: Diagnosis not present

## 2015-03-03 DIAGNOSIS — M79641 Pain in right hand: Secondary | ICD-10-CM | POA: Diagnosis not present

## 2015-03-09 ENCOUNTER — Ambulatory Visit: Payer: Medicare Other | Admitting: Podiatry

## 2015-03-10 ENCOUNTER — Other Ambulatory Visit: Payer: Self-pay | Admitting: *Deleted

## 2015-03-10 DIAGNOSIS — J449 Chronic obstructive pulmonary disease, unspecified: Secondary | ICD-10-CM | POA: Diagnosis not present

## 2015-03-10 DIAGNOSIS — F329 Major depressive disorder, single episode, unspecified: Secondary | ICD-10-CM | POA: Diagnosis not present

## 2015-03-10 DIAGNOSIS — E119 Type 2 diabetes mellitus without complications: Secondary | ICD-10-CM | POA: Diagnosis not present

## 2015-03-10 DIAGNOSIS — F0390 Unspecified dementia without behavioral disturbance: Secondary | ICD-10-CM | POA: Diagnosis not present

## 2015-03-10 DIAGNOSIS — R2689 Other abnormalities of gait and mobility: Secondary | ICD-10-CM | POA: Diagnosis not present

## 2015-03-10 MED ORDER — FLUCONAZOLE 150 MG PO TABS: ORAL_TABLET | ORAL | Status: DC

## 2015-03-15 DIAGNOSIS — E119 Type 2 diabetes mellitus without complications: Secondary | ICD-10-CM | POA: Diagnosis not present

## 2015-03-15 DIAGNOSIS — J449 Chronic obstructive pulmonary disease, unspecified: Secondary | ICD-10-CM | POA: Diagnosis not present

## 2015-03-15 DIAGNOSIS — F0390 Unspecified dementia without behavioral disturbance: Secondary | ICD-10-CM | POA: Diagnosis not present

## 2015-03-15 DIAGNOSIS — R2689 Other abnormalities of gait and mobility: Secondary | ICD-10-CM | POA: Diagnosis not present

## 2015-03-15 DIAGNOSIS — R3915 Urgency of urination: Secondary | ICD-10-CM | POA: Diagnosis not present

## 2015-03-15 DIAGNOSIS — K5900 Constipation, unspecified: Secondary | ICD-10-CM | POA: Diagnosis not present

## 2015-03-15 DIAGNOSIS — H6121 Impacted cerumen, right ear: Secondary | ICD-10-CM | POA: Diagnosis not present

## 2015-03-15 DIAGNOSIS — R8299 Other abnormal findings in urine: Secondary | ICD-10-CM | POA: Diagnosis not present

## 2015-03-15 DIAGNOSIS — B379 Candidiasis, unspecified: Secondary | ICD-10-CM | POA: Diagnosis not present

## 2015-03-15 DIAGNOSIS — F329 Major depressive disorder, single episode, unspecified: Secondary | ICD-10-CM | POA: Diagnosis not present

## 2015-03-16 DIAGNOSIS — E119 Type 2 diabetes mellitus without complications: Secondary | ICD-10-CM | POA: Diagnosis not present

## 2015-03-16 DIAGNOSIS — F329 Major depressive disorder, single episode, unspecified: Secondary | ICD-10-CM | POA: Diagnosis not present

## 2015-03-16 DIAGNOSIS — F0390 Unspecified dementia without behavioral disturbance: Secondary | ICD-10-CM | POA: Diagnosis not present

## 2015-03-16 DIAGNOSIS — R2689 Other abnormalities of gait and mobility: Secondary | ICD-10-CM | POA: Diagnosis not present

## 2015-03-16 DIAGNOSIS — J449 Chronic obstructive pulmonary disease, unspecified: Secondary | ICD-10-CM | POA: Diagnosis not present

## 2015-03-17 DIAGNOSIS — N390 Urinary tract infection, site not specified: Secondary | ICD-10-CM | POA: Diagnosis not present

## 2015-03-22 DIAGNOSIS — F329 Major depressive disorder, single episode, unspecified: Secondary | ICD-10-CM | POA: Diagnosis not present

## 2015-03-22 DIAGNOSIS — J449 Chronic obstructive pulmonary disease, unspecified: Secondary | ICD-10-CM | POA: Diagnosis not present

## 2015-03-22 DIAGNOSIS — E119 Type 2 diabetes mellitus without complications: Secondary | ICD-10-CM | POA: Diagnosis not present

## 2015-03-22 DIAGNOSIS — F0390 Unspecified dementia without behavioral disturbance: Secondary | ICD-10-CM | POA: Diagnosis not present

## 2015-03-22 DIAGNOSIS — R2689 Other abnormalities of gait and mobility: Secondary | ICD-10-CM | POA: Diagnosis not present

## 2015-03-24 DIAGNOSIS — E119 Type 2 diabetes mellitus without complications: Secondary | ICD-10-CM | POA: Diagnosis not present

## 2015-03-24 DIAGNOSIS — F329 Major depressive disorder, single episode, unspecified: Secondary | ICD-10-CM | POA: Diagnosis not present

## 2015-03-24 DIAGNOSIS — J449 Chronic obstructive pulmonary disease, unspecified: Secondary | ICD-10-CM | POA: Diagnosis not present

## 2015-03-24 DIAGNOSIS — F0390 Unspecified dementia without behavioral disturbance: Secondary | ICD-10-CM | POA: Diagnosis not present

## 2015-03-24 DIAGNOSIS — R05 Cough: Secondary | ICD-10-CM | POA: Diagnosis not present

## 2015-03-24 DIAGNOSIS — R2689 Other abnormalities of gait and mobility: Secondary | ICD-10-CM | POA: Diagnosis not present

## 2015-03-24 DIAGNOSIS — J309 Allergic rhinitis, unspecified: Secondary | ICD-10-CM | POA: Diagnosis not present

## 2015-03-28 ENCOUNTER — Telehealth: Payer: Self-pay | Admitting: *Deleted

## 2015-03-28 NOTE — Telephone Encounter (Signed)
Sent most recent notes to [facility] for PT eval

## 2015-03-29 ENCOUNTER — Ambulatory Visit: Payer: Self-pay | Admitting: Internal Medicine

## 2015-03-29 DIAGNOSIS — J449 Chronic obstructive pulmonary disease, unspecified: Secondary | ICD-10-CM | POA: Diagnosis not present

## 2015-03-29 DIAGNOSIS — E119 Type 2 diabetes mellitus without complications: Secondary | ICD-10-CM | POA: Diagnosis not present

## 2015-03-29 DIAGNOSIS — F0390 Unspecified dementia without behavioral disturbance: Secondary | ICD-10-CM | POA: Diagnosis not present

## 2015-03-29 DIAGNOSIS — R2689 Other abnormalities of gait and mobility: Secondary | ICD-10-CM | POA: Diagnosis not present

## 2015-03-29 DIAGNOSIS — F329 Major depressive disorder, single episode, unspecified: Secondary | ICD-10-CM | POA: Diagnosis not present

## 2015-03-31 DIAGNOSIS — F329 Major depressive disorder, single episode, unspecified: Secondary | ICD-10-CM | POA: Diagnosis not present

## 2015-03-31 DIAGNOSIS — R2689 Other abnormalities of gait and mobility: Secondary | ICD-10-CM | POA: Diagnosis not present

## 2015-03-31 DIAGNOSIS — J449 Chronic obstructive pulmonary disease, unspecified: Secondary | ICD-10-CM | POA: Diagnosis not present

## 2015-03-31 DIAGNOSIS — E119 Type 2 diabetes mellitus without complications: Secondary | ICD-10-CM | POA: Diagnosis not present

## 2015-03-31 DIAGNOSIS — F0390 Unspecified dementia without behavioral disturbance: Secondary | ICD-10-CM | POA: Diagnosis not present

## 2015-04-05 DIAGNOSIS — E119 Type 2 diabetes mellitus without complications: Secondary | ICD-10-CM | POA: Diagnosis not present

## 2015-04-05 DIAGNOSIS — J449 Chronic obstructive pulmonary disease, unspecified: Secondary | ICD-10-CM | POA: Diagnosis not present

## 2015-04-05 DIAGNOSIS — F329 Major depressive disorder, single episode, unspecified: Secondary | ICD-10-CM | POA: Diagnosis not present

## 2015-04-05 DIAGNOSIS — R2689 Other abnormalities of gait and mobility: Secondary | ICD-10-CM | POA: Diagnosis not present

## 2015-04-05 DIAGNOSIS — F0390 Unspecified dementia without behavioral disturbance: Secondary | ICD-10-CM | POA: Diagnosis not present

## 2015-04-07 DIAGNOSIS — F329 Major depressive disorder, single episode, unspecified: Secondary | ICD-10-CM | POA: Diagnosis not present

## 2015-04-07 DIAGNOSIS — R2689 Other abnormalities of gait and mobility: Secondary | ICD-10-CM | POA: Diagnosis not present

## 2015-04-07 DIAGNOSIS — F0390 Unspecified dementia without behavioral disturbance: Secondary | ICD-10-CM | POA: Diagnosis not present

## 2015-04-07 DIAGNOSIS — J449 Chronic obstructive pulmonary disease, unspecified: Secondary | ICD-10-CM | POA: Diagnosis not present

## 2015-04-07 DIAGNOSIS — E119 Type 2 diabetes mellitus without complications: Secondary | ICD-10-CM | POA: Diagnosis not present

## 2015-04-12 DIAGNOSIS — F0390 Unspecified dementia without behavioral disturbance: Secondary | ICD-10-CM | POA: Diagnosis not present

## 2015-04-12 DIAGNOSIS — J449 Chronic obstructive pulmonary disease, unspecified: Secondary | ICD-10-CM | POA: Diagnosis not present

## 2015-04-12 DIAGNOSIS — K5900 Constipation, unspecified: Secondary | ICD-10-CM | POA: Diagnosis not present

## 2015-04-12 DIAGNOSIS — E119 Type 2 diabetes mellitus without complications: Secondary | ICD-10-CM | POA: Diagnosis not present

## 2015-04-12 DIAGNOSIS — F329 Major depressive disorder, single episode, unspecified: Secondary | ICD-10-CM | POA: Diagnosis not present

## 2015-04-12 DIAGNOSIS — I10 Essential (primary) hypertension: Secondary | ICD-10-CM | POA: Diagnosis not present

## 2015-04-12 DIAGNOSIS — R2689 Other abnormalities of gait and mobility: Secondary | ICD-10-CM | POA: Diagnosis not present

## 2015-04-12 DIAGNOSIS — E118 Type 2 diabetes mellitus with unspecified complications: Secondary | ICD-10-CM | POA: Diagnosis not present

## 2015-04-12 DIAGNOSIS — H6121 Impacted cerumen, right ear: Secondary | ICD-10-CM | POA: Diagnosis not present

## 2015-04-12 DIAGNOSIS — B379 Candidiasis, unspecified: Secondary | ICD-10-CM | POA: Diagnosis not present

## 2015-04-14 DIAGNOSIS — E119 Type 2 diabetes mellitus without complications: Secondary | ICD-10-CM | POA: Diagnosis not present

## 2015-04-14 DIAGNOSIS — F0390 Unspecified dementia without behavioral disturbance: Secondary | ICD-10-CM | POA: Diagnosis not present

## 2015-04-14 DIAGNOSIS — J449 Chronic obstructive pulmonary disease, unspecified: Secondary | ICD-10-CM | POA: Diagnosis not present

## 2015-04-14 DIAGNOSIS — R2689 Other abnormalities of gait and mobility: Secondary | ICD-10-CM | POA: Diagnosis not present

## 2015-04-14 DIAGNOSIS — F329 Major depressive disorder, single episode, unspecified: Secondary | ICD-10-CM | POA: Diagnosis not present

## 2015-04-19 DIAGNOSIS — E119 Type 2 diabetes mellitus without complications: Secondary | ICD-10-CM | POA: Diagnosis not present

## 2015-04-19 DIAGNOSIS — R2689 Other abnormalities of gait and mobility: Secondary | ICD-10-CM | POA: Diagnosis not present

## 2015-04-19 DIAGNOSIS — F329 Major depressive disorder, single episode, unspecified: Secondary | ICD-10-CM | POA: Diagnosis not present

## 2015-04-19 DIAGNOSIS — J449 Chronic obstructive pulmonary disease, unspecified: Secondary | ICD-10-CM | POA: Diagnosis not present

## 2015-04-19 DIAGNOSIS — F0390 Unspecified dementia without behavioral disturbance: Secondary | ICD-10-CM | POA: Diagnosis not present

## 2015-04-21 DIAGNOSIS — F0390 Unspecified dementia without behavioral disturbance: Secondary | ICD-10-CM | POA: Diagnosis not present

## 2015-04-21 DIAGNOSIS — J449 Chronic obstructive pulmonary disease, unspecified: Secondary | ICD-10-CM | POA: Diagnosis not present

## 2015-04-21 DIAGNOSIS — R2689 Other abnormalities of gait and mobility: Secondary | ICD-10-CM | POA: Diagnosis not present

## 2015-04-21 DIAGNOSIS — F329 Major depressive disorder, single episode, unspecified: Secondary | ICD-10-CM | POA: Diagnosis not present

## 2015-04-21 DIAGNOSIS — E119 Type 2 diabetes mellitus without complications: Secondary | ICD-10-CM | POA: Diagnosis not present

## 2015-04-23 ENCOUNTER — Encounter: Payer: Self-pay | Admitting: *Deleted

## 2015-04-26 DIAGNOSIS — R2689 Other abnormalities of gait and mobility: Secondary | ICD-10-CM | POA: Diagnosis not present

## 2015-04-26 DIAGNOSIS — F0390 Unspecified dementia without behavioral disturbance: Secondary | ICD-10-CM | POA: Diagnosis not present

## 2015-04-26 DIAGNOSIS — E119 Type 2 diabetes mellitus without complications: Secondary | ICD-10-CM | POA: Diagnosis not present

## 2015-04-26 DIAGNOSIS — F329 Major depressive disorder, single episode, unspecified: Secondary | ICD-10-CM | POA: Diagnosis not present

## 2015-04-26 DIAGNOSIS — J449 Chronic obstructive pulmonary disease, unspecified: Secondary | ICD-10-CM | POA: Diagnosis not present

## 2015-04-27 DIAGNOSIS — R2689 Other abnormalities of gait and mobility: Secondary | ICD-10-CM | POA: Diagnosis not present

## 2015-04-27 DIAGNOSIS — F329 Major depressive disorder, single episode, unspecified: Secondary | ICD-10-CM | POA: Diagnosis not present

## 2015-04-27 DIAGNOSIS — J449 Chronic obstructive pulmonary disease, unspecified: Secondary | ICD-10-CM | POA: Diagnosis not present

## 2015-04-27 DIAGNOSIS — F0390 Unspecified dementia without behavioral disturbance: Secondary | ICD-10-CM | POA: Diagnosis not present

## 2015-04-27 DIAGNOSIS — E119 Type 2 diabetes mellitus without complications: Secondary | ICD-10-CM | POA: Diagnosis not present

## 2015-05-03 DIAGNOSIS — R2689 Other abnormalities of gait and mobility: Secondary | ICD-10-CM | POA: Diagnosis not present

## 2015-05-03 DIAGNOSIS — F329 Major depressive disorder, single episode, unspecified: Secondary | ICD-10-CM | POA: Diagnosis not present

## 2015-05-03 DIAGNOSIS — F0390 Unspecified dementia without behavioral disturbance: Secondary | ICD-10-CM | POA: Diagnosis not present

## 2015-05-03 DIAGNOSIS — J449 Chronic obstructive pulmonary disease, unspecified: Secondary | ICD-10-CM | POA: Diagnosis not present

## 2015-05-03 DIAGNOSIS — E119 Type 2 diabetes mellitus without complications: Secondary | ICD-10-CM | POA: Diagnosis not present

## 2015-05-05 DIAGNOSIS — F0390 Unspecified dementia without behavioral disturbance: Secondary | ICD-10-CM | POA: Diagnosis not present

## 2015-05-05 DIAGNOSIS — E119 Type 2 diabetes mellitus without complications: Secondary | ICD-10-CM | POA: Diagnosis not present

## 2015-05-05 DIAGNOSIS — J449 Chronic obstructive pulmonary disease, unspecified: Secondary | ICD-10-CM | POA: Diagnosis not present

## 2015-05-05 DIAGNOSIS — R2689 Other abnormalities of gait and mobility: Secondary | ICD-10-CM | POA: Diagnosis not present

## 2015-05-05 DIAGNOSIS — F329 Major depressive disorder, single episode, unspecified: Secondary | ICD-10-CM | POA: Diagnosis not present

## 2015-05-06 DIAGNOSIS — E118 Type 2 diabetes mellitus with unspecified complications: Secondary | ICD-10-CM | POA: Diagnosis not present

## 2015-05-06 DIAGNOSIS — I10 Essential (primary) hypertension: Secondary | ICD-10-CM | POA: Diagnosis not present

## 2015-05-10 DIAGNOSIS — E039 Hypothyroidism, unspecified: Secondary | ICD-10-CM | POA: Diagnosis not present

## 2015-05-10 DIAGNOSIS — H6121 Impacted cerumen, right ear: Secondary | ICD-10-CM | POA: Diagnosis not present

## 2015-05-10 DIAGNOSIS — B379 Candidiasis, unspecified: Secondary | ICD-10-CM | POA: Diagnosis not present

## 2015-05-10 DIAGNOSIS — E118 Type 2 diabetes mellitus with unspecified complications: Secondary | ICD-10-CM | POA: Diagnosis not present

## 2015-05-10 DIAGNOSIS — K5900 Constipation, unspecified: Secondary | ICD-10-CM | POA: Diagnosis not present

## 2015-05-12 ENCOUNTER — Other Ambulatory Visit: Payer: Self-pay | Admitting: Internal Medicine

## 2015-05-12 DIAGNOSIS — R609 Edema, unspecified: Secondary | ICD-10-CM

## 2015-05-12 MED ORDER — HYDROCHLOROTHIAZIDE 25 MG PO TABS: 25.0000 mg | ORAL_TABLET | Freq: Every day | ORAL | Status: DC

## 2015-06-07 DIAGNOSIS — H6121 Impacted cerumen, right ear: Secondary | ICD-10-CM | POA: Diagnosis not present

## 2015-06-07 DIAGNOSIS — B379 Candidiasis, unspecified: Secondary | ICD-10-CM | POA: Diagnosis not present

## 2015-06-07 DIAGNOSIS — E039 Hypothyroidism, unspecified: Secondary | ICD-10-CM | POA: Diagnosis not present

## 2015-06-07 DIAGNOSIS — I10 Essential (primary) hypertension: Secondary | ICD-10-CM | POA: Diagnosis not present

## 2015-06-07 DIAGNOSIS — E118 Type 2 diabetes mellitus with unspecified complications: Secondary | ICD-10-CM | POA: Diagnosis not present

## 2015-06-24 DIAGNOSIS — E119 Type 2 diabetes mellitus without complications: Secondary | ICD-10-CM | POA: Diagnosis not present

## 2015-06-24 DIAGNOSIS — I10 Essential (primary) hypertension: Secondary | ICD-10-CM | POA: Diagnosis not present

## 2015-06-24 DIAGNOSIS — E782 Mixed hyperlipidemia: Secondary | ICD-10-CM | POA: Diagnosis not present

## 2015-06-24 DIAGNOSIS — E039 Hypothyroidism, unspecified: Secondary | ICD-10-CM | POA: Diagnosis not present

## 2015-06-24 DIAGNOSIS — R5381 Other malaise: Secondary | ICD-10-CM | POA: Diagnosis not present

## 2015-07-05 DIAGNOSIS — E039 Hypothyroidism, unspecified: Secondary | ICD-10-CM | POA: Diagnosis not present

## 2015-07-05 DIAGNOSIS — E118 Type 2 diabetes mellitus with unspecified complications: Secondary | ICD-10-CM | POA: Diagnosis not present

## 2015-07-05 DIAGNOSIS — H6121 Impacted cerumen, right ear: Secondary | ICD-10-CM | POA: Diagnosis not present

## 2015-07-05 DIAGNOSIS — R600 Localized edema: Secondary | ICD-10-CM | POA: Diagnosis not present

## 2015-07-05 DIAGNOSIS — I10 Essential (primary) hypertension: Secondary | ICD-10-CM | POA: Diagnosis not present

## 2015-07-11 DIAGNOSIS — I10 Essential (primary) hypertension: Secondary | ICD-10-CM | POA: Diagnosis not present

## 2015-07-11 DIAGNOSIS — H6121 Impacted cerumen, right ear: Secondary | ICD-10-CM | POA: Diagnosis not present

## 2015-07-11 DIAGNOSIS — E118 Type 2 diabetes mellitus with unspecified complications: Secondary | ICD-10-CM | POA: Diagnosis not present

## 2015-07-11 DIAGNOSIS — R600 Localized edema: Secondary | ICD-10-CM | POA: Diagnosis not present

## 2015-07-11 DIAGNOSIS — E039 Hypothyroidism, unspecified: Secondary | ICD-10-CM | POA: Diagnosis not present

## 2015-07-12 DIAGNOSIS — R8299 Other abnormal findings in urine: Secondary | ICD-10-CM | POA: Diagnosis not present

## 2015-07-13 DIAGNOSIS — N390 Urinary tract infection, site not specified: Secondary | ICD-10-CM | POA: Diagnosis not present

## 2015-07-14 ENCOUNTER — Encounter: Payer: Self-pay | Admitting: Internal Medicine

## 2015-07-18 ENCOUNTER — Other Ambulatory Visit: Payer: Self-pay | Admitting: Internal Medicine

## 2015-07-26 ENCOUNTER — Other Ambulatory Visit: Payer: Self-pay | Admitting: Internal Medicine

## 2015-07-26 ENCOUNTER — Encounter (HOSPITAL_COMMUNITY): Payer: Self-pay | Admitting: Emergency Medicine

## 2015-07-26 ENCOUNTER — Emergency Department (HOSPITAL_COMMUNITY): Payer: Medicare Other

## 2015-07-26 ENCOUNTER — Inpatient Hospital Stay (HOSPITAL_COMMUNITY)
Admission: EM | Admit: 2015-07-26 | Discharge: 2015-08-01 | DRG: 470 | Disposition: A | Discharge status: Skilled Nursing Facility | Payer: Medicare Other | Attending: Internal Medicine | Admitting: Internal Medicine

## 2015-07-26 DIAGNOSIS — Y92129 Unspecified place in nursing home as the place of occurrence of the external cause: Secondary | ICD-10-CM

## 2015-07-26 DIAGNOSIS — F0390 Unspecified dementia without behavioral disturbance: Secondary | ICD-10-CM | POA: Diagnosis present

## 2015-07-26 DIAGNOSIS — S72002A Fracture of unspecified part of neck of left femur, initial encounter for closed fracture: Secondary | ICD-10-CM | POA: Diagnosis not present

## 2015-07-26 DIAGNOSIS — R0600 Dyspnea, unspecified: Secondary | ICD-10-CM | POA: Diagnosis not present

## 2015-07-26 DIAGNOSIS — Z91048 Other nonmedicinal substance allergy status: Secondary | ICD-10-CM

## 2015-07-26 DIAGNOSIS — Z471 Aftercare following joint replacement surgery: Secondary | ICD-10-CM | POA: Diagnosis not present

## 2015-07-26 DIAGNOSIS — F329 Major depressive disorder, single episode, unspecified: Secondary | ICD-10-CM | POA: Diagnosis present

## 2015-07-26 DIAGNOSIS — E559 Vitamin D deficiency, unspecified: Secondary | ICD-10-CM | POA: Diagnosis not present

## 2015-07-26 DIAGNOSIS — Z888 Allergy status to other drugs, medicaments and biological substances status: Secondary | ICD-10-CM

## 2015-07-26 DIAGNOSIS — Z66 Do not resuscitate: Secondary | ICD-10-CM | POA: Diagnosis present

## 2015-07-26 DIAGNOSIS — S72001A Fracture of unspecified part of neck of right femur, initial encounter for closed fracture: Secondary | ICD-10-CM | POA: Diagnosis not present

## 2015-07-26 DIAGNOSIS — M545 Low back pain: Secondary | ICD-10-CM | POA: Diagnosis not present

## 2015-07-26 DIAGNOSIS — E1122 Type 2 diabetes mellitus with diabetic chronic kidney disease: Secondary | ICD-10-CM | POA: Diagnosis not present

## 2015-07-26 DIAGNOSIS — R2681 Unsteadiness on feet: Secondary | ICD-10-CM | POA: Diagnosis not present

## 2015-07-26 DIAGNOSIS — I447 Left bundle-branch block, unspecified: Secondary | ICD-10-CM | POA: Diagnosis not present

## 2015-07-26 DIAGNOSIS — E785 Hyperlipidemia, unspecified: Secondary | ICD-10-CM | POA: Diagnosis not present

## 2015-07-26 DIAGNOSIS — W19XXXA Unspecified fall, initial encounter: Secondary | ICD-10-CM | POA: Diagnosis present

## 2015-07-26 DIAGNOSIS — Z833 Family history of diabetes mellitus: Secondary | ICD-10-CM | POA: Diagnosis not present

## 2015-07-26 DIAGNOSIS — M6281 Muscle weakness (generalized): Secondary | ICD-10-CM | POA: Diagnosis not present

## 2015-07-26 DIAGNOSIS — Z7984 Long term (current) use of oral hypoglycemic drugs: Secondary | ICD-10-CM

## 2015-07-26 DIAGNOSIS — R1312 Dysphagia, oropharyngeal phase: Secondary | ICD-10-CM | POA: Diagnosis not present

## 2015-07-26 DIAGNOSIS — I517 Cardiomegaly: Secondary | ICD-10-CM | POA: Diagnosis not present

## 2015-07-26 DIAGNOSIS — F0391 Unspecified dementia with behavioral disturbance: Secondary | ICD-10-CM | POA: Diagnosis not present

## 2015-07-26 DIAGNOSIS — K219 Gastro-esophageal reflux disease without esophagitis: Secondary | ICD-10-CM | POA: Diagnosis not present

## 2015-07-26 DIAGNOSIS — N183 Chronic kidney disease, stage 3 (moderate): Secondary | ICD-10-CM | POA: Diagnosis present

## 2015-07-26 DIAGNOSIS — J8410 Pulmonary fibrosis, unspecified: Secondary | ICD-10-CM | POA: Diagnosis not present

## 2015-07-26 DIAGNOSIS — M25552 Pain in left hip: Secondary | ICD-10-CM | POA: Diagnosis not present

## 2015-07-26 DIAGNOSIS — I129 Hypertensive chronic kidney disease with stage 1 through stage 4 chronic kidney disease, or unspecified chronic kidney disease: Secondary | ICD-10-CM | POA: Diagnosis not present

## 2015-07-26 DIAGNOSIS — J449 Chronic obstructive pulmonary disease, unspecified: Secondary | ICD-10-CM | POA: Diagnosis not present

## 2015-07-26 DIAGNOSIS — E538 Deficiency of other specified B group vitamins: Secondary | ICD-10-CM | POA: Diagnosis not present

## 2015-07-26 DIAGNOSIS — E039 Hypothyroidism, unspecified: Secondary | ICD-10-CM | POA: Diagnosis not present

## 2015-07-26 DIAGNOSIS — Z01818 Encounter for other preprocedural examination: Secondary | ICD-10-CM | POA: Diagnosis not present

## 2015-07-26 DIAGNOSIS — S72012A Unspecified intracapsular fracture of left femur, initial encounter for closed fracture: Secondary | ICD-10-CM | POA: Diagnosis not present

## 2015-07-26 DIAGNOSIS — A419 Sepsis, unspecified organism: Secondary | ICD-10-CM | POA: Diagnosis not present

## 2015-07-26 DIAGNOSIS — Z8249 Family history of ischemic heart disease and other diseases of the circulatory system: Secondary | ICD-10-CM | POA: Diagnosis not present

## 2015-07-26 DIAGNOSIS — S199XXA Unspecified injury of neck, initial encounter: Secondary | ICD-10-CM | POA: Diagnosis not present

## 2015-07-26 DIAGNOSIS — R410 Disorientation, unspecified: Secondary | ICD-10-CM | POA: Diagnosis not present

## 2015-07-26 DIAGNOSIS — I10 Essential (primary) hypertension: Secondary | ICD-10-CM | POA: Diagnosis not present

## 2015-07-26 DIAGNOSIS — Z885 Allergy status to narcotic agent status: Secondary | ICD-10-CM

## 2015-07-26 DIAGNOSIS — E876 Hypokalemia: Secondary | ICD-10-CM | POA: Diagnosis not present

## 2015-07-26 DIAGNOSIS — Z79899 Other long term (current) drug therapy: Secondary | ICD-10-CM

## 2015-07-26 DIAGNOSIS — S72009A Fracture of unspecified part of neck of unspecified femur, initial encounter for closed fracture: Secondary | ICD-10-CM | POA: Diagnosis not present

## 2015-07-26 DIAGNOSIS — J45909 Unspecified asthma, uncomplicated: Secondary | ICD-10-CM | POA: Diagnosis present

## 2015-07-26 DIAGNOSIS — S72002D Fracture of unspecified part of neck of left femur, subsequent encounter for closed fracture with routine healing: Secondary | ICD-10-CM | POA: Diagnosis not present

## 2015-07-26 DIAGNOSIS — G309 Alzheimer's disease, unspecified: Secondary | ICD-10-CM | POA: Diagnosis not present

## 2015-07-26 DIAGNOSIS — M542 Cervicalgia: Secondary | ICD-10-CM | POA: Diagnosis not present

## 2015-07-26 DIAGNOSIS — E119 Type 2 diabetes mellitus without complications: Secondary | ICD-10-CM | POA: Diagnosis not present

## 2015-07-26 DIAGNOSIS — Z01810 Encounter for preprocedural cardiovascular examination: Secondary | ICD-10-CM | POA: Diagnosis not present

## 2015-07-26 DIAGNOSIS — S0990XA Unspecified injury of head, initial encounter: Secondary | ICD-10-CM | POA: Diagnosis not present

## 2015-07-26 DIAGNOSIS — Z419 Encounter for procedure for purposes other than remedying health state, unspecified: Secondary | ICD-10-CM

## 2015-07-26 DIAGNOSIS — Z96642 Presence of left artificial hip joint: Secondary | ICD-10-CM | POA: Diagnosis not present

## 2015-07-26 DIAGNOSIS — R278 Other lack of coordination: Secondary | ICD-10-CM | POA: Diagnosis not present

## 2015-07-26 DIAGNOSIS — R0989 Other specified symptoms and signs involving the circulatory and respiratory systems: Secondary | ICD-10-CM

## 2015-07-26 DIAGNOSIS — T148 Other injury of unspecified body region: Secondary | ICD-10-CM | POA: Diagnosis not present

## 2015-07-26 DIAGNOSIS — G9340 Encephalopathy, unspecified: Secondary | ICD-10-CM | POA: Diagnosis not present

## 2015-07-26 LAB — CBC WITH DIFFERENTIAL/PLATELET
BASOS ABS: 0 10*3/uL (ref 0.0–0.1)
BASOS PCT: 0 %
EOS ABS: 0 10*3/uL (ref 0.0–0.7)
EOS PCT: 0 %
HCT: 35.7 % — ABNORMAL LOW (ref 36.0–46.0)
Hemoglobin: 12.2 g/dL (ref 12.0–15.0)
Lymphocytes Relative: 8 %
Lymphs Abs: 1.1 10*3/uL (ref 0.7–4.0)
MCH: 29.8 pg (ref 26.0–34.0)
MCHC: 34.2 g/dL (ref 30.0–36.0)
MCV: 87.3 fL (ref 78.0–100.0)
MONO ABS: 1.1 10*3/uL — AB (ref 0.1–1.0)
Monocytes Relative: 8 %
Neutro Abs: 11.9 10*3/uL — ABNORMAL HIGH (ref 1.7–7.7)
Neutrophils Relative %: 84 %
Platelets: 226 10*3/uL (ref 150–400)
RBC: 4.09 MIL/uL (ref 3.87–5.11)
RDW: 12.9 % (ref 11.5–15.5)
WBC: 14.1 10*3/uL — AB (ref 4.0–10.5)

## 2015-07-26 LAB — COMPREHENSIVE METABOLIC PANEL
ALBUMIN: 3.6 g/dL (ref 3.5–5.0)
ALK PHOS: 95 U/L (ref 38–126)
ALT: 34 U/L (ref 14–54)
ANION GAP: 9 (ref 5–15)
AST: 38 U/L (ref 15–41)
BUN: 12 mg/dL (ref 6–20)
CALCIUM: 9.2 mg/dL (ref 8.9–10.3)
CO2: 23 mmol/L (ref 22–32)
Chloride: 103 mmol/L (ref 101–111)
Creatinine, Ser: 0.73 mg/dL (ref 0.44–1.00)
GFR calc Af Amer: >60 mL/min (ref >60)
GLUCOSE: 202 mg/dL — AB (ref 65–99)
POTASSIUM: 3.5 mmol/L (ref 3.5–5.1)
Sodium: 135 mmol/L (ref 135–145)
Total Bilirubin: 0.9 mg/dL (ref 0.3–1.2)
Total Protein: 6.7 g/dL (ref 6.5–8.1)

## 2015-07-26 LAB — URINE MICROSCOPIC-ADD ON

## 2015-07-26 LAB — TYPE AND SCREEN
ABO/RH(D): A POS
ANTIBODY SCREEN: NEGATIVE

## 2015-07-26 LAB — URINALYSIS, ROUTINE W REFLEX MICROSCOPIC
Bilirubin Urine: NEGATIVE
GLUCOSE, UA: 500 mg/dL — AB
HGB URINE DIPSTICK: NEGATIVE
KETONES UR: 15 mg/dL — AB
Leukocytes, UA: NEGATIVE
Nitrite: NEGATIVE
PROTEIN: 100 mg/dL — AB
Specific Gravity, Urine: 1.018 (ref 1.005–1.030)
pH: 7.5 (ref 5.0–8.0)

## 2015-07-26 LAB — PROTIME-INR
INR: 1.13 (ref 0.00–1.49)
PROTHROMBIN TIME: 14.7 s (ref 11.6–15.2)

## 2015-07-26 MED ORDER — HEPARIN SODIUM (PORCINE) 5000 UNIT/ML IJ SOLN
5000.0000 [IU] | Freq: Three times a day (TID) | INTRAMUSCULAR | Status: DC
  Administered 2015-07-26 – 2015-08-01 (×14): 5000 [IU] via SUBCUTANEOUS
  Filled 2015-07-26 (×11): qty 1

## 2015-07-26 MED ORDER — HYDROMORPHONE HCL 1 MG/ML IJ SOLN
0.5000 mg | INTRAMUSCULAR | Status: DC | PRN
  Administered 2015-07-27: 0.5 mg via INTRAVENOUS
  Filled 2015-07-26: qty 1

## 2015-07-26 MED ORDER — SODIUM CHLORIDE 0.9% FLUSH
3.0000 mL | Freq: Two times a day (BID) | INTRAVENOUS | Status: DC
  Administered 2015-07-27 – 2015-08-01 (×5): 3 mL via INTRAVENOUS

## 2015-07-26 MED ORDER — ONDANSETRON HCL 4 MG PO TABS
4.0000 mg | ORAL_TABLET | Freq: Four times a day (QID) | ORAL | Status: DC | PRN
Start: 2015-07-26 — End: 2015-08-01

## 2015-07-26 MED ORDER — FENTANYL CITRATE (PF) 100 MCG/2ML IJ SOLN
50.0000 ug | INTRAMUSCULAR | Status: DC | PRN
  Administered 2015-07-27: 50 ug via INTRAVENOUS
  Filled 2015-07-26: qty 2

## 2015-07-26 MED ORDER — ONDANSETRON HCL 4 MG/2ML IJ SOLN
4.0000 mg | Freq: Four times a day (QID) | INTRAMUSCULAR | Status: DC | PRN
  Administered 2015-07-27 – 2015-07-28 (×2): 4 mg via INTRAVENOUS
  Filled 2015-07-26 (×2): qty 2

## 2015-07-26 NOTE — ED Notes (Signed)
Pt here from [facility] with known left hip fracture from a mobile x ray. Pt has no known trauma or fall. Pt received 60mcg fentanyl PTA. Pt lethargic on arrival. Pt has alzheimers, but is normally ambulatory.

## 2015-07-26 NOTE — ED Notes (Signed)
Admitting at bedside 

## 2015-07-26 NOTE — H&P (Signed)
History and Physical    [name] [name] [id number] DOB: 1929-05-02 DOA: 07/26/2015  PCP: Alesia Richards, MD   Patient coming from: [facility].  Chief Complaint: Left hip fracture.  HPI: [name] [name] is a 82 y.o. female with medical history significant of asthma, type 2 diabetes, hyperlipidemia, hypertension, chronic headaches, COPD, pulmonary fibrosis, B12 deficiency, vitamin D deficiency, depression, Alzheimer's dementia, hypothyroidism who is usually ambulatory at the nursing home facility where she lives and comes via EMS to the emergency department due to having a left hip fracture diagnosed by a mobile x-ray. There is no known history of fall or any other history of trauma. She is currently sedated due to analgesics and unable to provide any history. Per patient's niece, the patient is usually ambulatory.  ED Course: The patient received analgesics. Repeat x-ray confirms acute left femoral neck comminuted fracture.  Review of Systems: As per HPI otherwise 10 point review of systems negative.    Past Medical History  Diagnosis Date  . Asthma   . Type II or unspecified type diabetes mellitus without mention of complication, not stated as uncontrolled   . Hyperlipidemia   . Chronic headaches   . Hypertension   . Pulmonary fibrosis (HCC)     Chronic granulomatous changes on CT chest, minimal mediastinal LAN 3/09  . COPD (chronic obstructive pulmonary disease) ([place])   . Vitamin D deficiency   . B12 deficiency   . Allergy   . Dementia   . Depression   . Hypothyroidism   . CKD (chronic kidney disease) stage 3, GFR 30-59 ml/min     Past Surgical History  Procedure Laterality Date  . Appendectomy    . Thyroidectomy    . Tubal ligation    . Esophagogastroduodenoscopy  2012    Gastritis  . Breast biopsy      right breast/benign  . Endometrial biopsy  1998    negative     reports that she has never smoked. She has never used smokeless  tobacco. She reports that she does not drink alcohol or use illicit drugs.  Allergies  Allergen Reactions  . Iron     GI  . Meloxicam Other (See Comments)    Abd pain  . Meperidine Hcl Other (See Comments)    agitated  . Pravastatin     myalgias  . Zolpidem Tartrate Other (See Comments)    confusion  . Codeine Palpitations    "heart raced"    Family History  Problem Relation Age of Onset  . Asthma Sister   . Cancer Sister     breast, colon  . Diabetes Mother   . Cancer Mother     breast  . Diabetes Sister   . Cancer Sister 81    breast  . Heart disease Father   . Hypertension Father   . Cancer Brother     leukemia  . Cancer Brother     lung  . Hypertension Brother   . Hyperlipidemia Brother     Prior to Admission medications   Medication Sig Start Date End Date Taking? Authorizing Provider  clonazePAM (KLONOPIN) 0.5 MG tablet Take 0.25 mg by mouth daily as needed for anxiety.   Yes Historical Provider, MD  clonazePAM (KLONOPIN) 1 MG tablet Take 1 mg by mouth at bedtime.   Yes Historical Provider, MD  Cranberry 425 MG CAPS Take 1 capsule by mouth daily.   Yes Historical Provider, MD  docusate sodium (COLACE) 100  MG capsule Take 100 mg by mouth daily.   Yes Historical Provider, MD  levothyroxine (SYNTHROID, LEVOTHROID) 25 MCG tablet Take 25 mcg by mouth daily before breakfast.   Yes Historical Provider, MD  loratadine (CLARITIN) 10 MG tablet Take 10 mg by mouth every morning.    Yes Historical Provider, MD  Melatonin 10 MG CAPS Take 1 capsule by mouth daily.   Yes Historical Provider, MD  sertraline (ZOLOFT) 100 MG tablet Take 100 mg by mouth daily. Take along with the 25mg  tablet to make a total dose of 125mg  daily   Yes Historical Provider, MD  sertraline (ZOLOFT) 25 MG tablet Take 25 mg by mouth daily. Take along with the 100mg  tablet to make a total of 125mg  daily   Yes Historical Provider, MD  traZODone (DESYREL) 100 MG tablet Take 100 mg by mouth at bedtime.    Yes Historical Provider, MD  verapamil (CALAN-SR) 240 MG CR tablet take 1 tablet by mouth once daily for blood pressure 01/06/15  Yes Unk Pinto, MD  benazepril (LOTENSIN) 10 MG tablet Take 1 tablet (10 mg total) by mouth daily. Patient not taking: Reported on 07/26/2015 06/16/14   Starlyn Skeans, PA-C  citalopram (CELEXA) 20 MG tablet Take 1 tablet daily Patient not taking: Reported on 07/26/2015 09/23/14   Unk Pinto, MD  fluconazole (DIFLUCAN) 150 MG tablet Take 1 tablet 2 times a week, on Monday and Thursday, for 4 weeks. Patient not taking: Reported on 07/26/2015 03/10/15   Unk Pinto, MD  haloperidol (HALDOL) 5 MG tablet Take 1 tablet 1 x day with Supper (5pm)  and q4hr prn agitation. (Max 6 total doses/24 hr) Hold supper dose if patient sedate Patient not taking: Reported on 07/26/2015 03/02/15   Unk Pinto, MD  hydrochlorothiazide (HYDRODIURIL) 25 MG tablet Take 1 tablet (25 mg total) by mouth daily. Patient not taking: Reported on 07/26/2015 05/12/15   Unk Pinto, MD  levothyroxine (SYNTHROID, LEVOTHROID) 75 MCG tablet take 1 tablet by mouth once daily Patient not taking: Reported on 07/26/2015 07/27/14   Unk Pinto, MD  metFORMIN (GLUCOPHAGE-XR) 500 MG 24 hr tablet take 1 to 2 tablets by mouth twice a day after meals Patient not taking: Reported on 07/26/2015 03/31/14   Unk Pinto, MD  traZODone (DESYREL) 150 MG tablet Take 1/2   tablet daily at 5PM Patient not taking: Reported on 07/26/2015 03/02/15   Unk Pinto, MD    Physical Exam: Filed Vitals:   07/26/15 1915 07/26/15 1930 07/26/15 1945 07/26/15 2115  BP: 164/78 166/97 170/78 168/74  Pulse: 95 93 93 89  Temp:      TempSrc:      Resp: 18 18 19    SpO2: 99% 99% 98% 98%      Constitutional: NAD, calm, Sedated. Filed Vitals:   07/26/15 1915 07/26/15 1930 07/26/15 1945 07/26/15 2115  BP: 164/78 166/97 170/78 168/74  Pulse: 95 93 93 89  Temp:      TempSrc:      Resp: 18 18 19    SpO2: 99% 99% 98%  98%   Eyes: PERRL, lids and conjunctivae normal ENMT: Mucous membranes are mildly dry. Posterior pharynx clear of any exudate or lesions.Absent dentition. Neck: normal, supple, no masses, no thyromegaly Respiratory: clear to auscultation bilaterally on anterior exam, no wheezing, no crackles. Normal respiratory effort. No accessory muscle use.  Cardiovascular: Regular rate and rhythm, no murmurs / rubs / gallops. No extremity edema. 2+ pedal pulses. No carotid bruits.  Abdomen: no tenderness, no masses  palpated. No hepatosplenomegaly. Bowel sounds positive.  Musculoskeletal: Shortening and external rotation of LLE. Positive distal pulses and capillary refill. Skin: no rashes, lesions, ulcers. No induration Neurologic: Sedated due to analgesics. Unable to fully evaluate. Psychiatric: Sedated due to analgesics.   Labs on Admission: I have personally reviewed following labs and imaging studies  CBC:  Recent Labs Lab 07/26/15 2001  WBC 14.1*  NEUTROABS 11.9*  HGB 12.2  HCT 35.7*  MCV 87.3  PLT [id number]   Basic Metabolic Panel:  Recent Labs Lab 07/26/15 2001  NA 135  K 3.5  CL 103  CO2 23  GLUCOSE 202*  BUN 12  CREATININE 0.73  CALCIUM 9.2   GFR: CrCl cannot be calculated (Unknown ideal weight.). Liver Function Tests:  Recent Labs Lab 07/26/15 2001  AST 38  ALT 34  ALKPHOS 95  BILITOT 0.9  PROT 6.7  ALBUMIN 3.6   No results for input(s): LIPASE, AMYLASE in the last 168 hours. No results for input(s): AMMONIA in the last 168 hours. Coagulation Profile:  Recent Labs Lab 07/26/15 2001  INR 1.13   Urine analysis:    Component Value Date/Time   COLORURINE YELLOW 07/26/2015 2114   APPEARANCEUR CLEAR 07/26/2015 2114   LABSPEC 1.018 07/26/2015 2114   PHURINE 7.5 07/26/2015 2114   GLUCOSEU 500* 07/26/2015 2114   HGBUR NEGATIVE 07/26/2015 2114   BILIRUBINUR NEGATIVE 07/26/2015 2114   KETONESUR 15* 07/26/2015 2114   PROTEINUR 100* 07/26/2015 2114   UROBILINOGEN  0.2 08/04/2014 1544   NITRITE NEGATIVE 07/26/2015 2114   LEUKOCYTESUR NEGATIVE 07/26/2015 2114     Radiological Exams on Admission: Dg Chest 1 View  07/26/2015  CLINICAL DATA:  Left femoral neck fracture. Preoperative respiratory evaluation. EXAM: CHEST 1 VIEW COMPARISON:  08/04/2014 and earlier. FINDINGS: AP supine examination was performed. Suboptimal inspiration. Cardiac silhouette mildly to moderately enlarged, unchanged. Pulmonary venous hypertension with perhaps mild perihilar pulmonary edema. Lungs otherwise clear. No pleural effusions. IMPRESSION: Stable cardiomegaly.  Possible mild perihilar pulmonary edema. Electronically Signed   By: Evangeline Dakin M.D.   On: 07/26/2015 20:48   Ct Head Wo Contrast  07/26/2015  CLINICAL DATA:  Fall.  Trauma. EXAM: CT HEAD WITHOUT CONTRAST TECHNIQUE: Contiguous axial images were obtained from the base of the skull through the vertex without intravenous contrast. COMPARISON:  February 22, 2014 FINDINGS: No subdural, epidural, or subarachnoid hemorrhage. Cerebellum, brainstem, and basal cisterns are normal. Moderate white matter changes are stable. Ventricles and sulci are unchanged. No acute cortical ischemia or infarct. Basal ganglia calcifications are noted. IMPRESSION: No acute intracranial process. Electronically Signed   By: Dorise Bullion III M.D   On: 07/26/2015 20:43   Ct Cervical Spine Wo Contrast  07/26/2015  CLINICAL DATA:  Pain after fall. EXAM: CT CERVICAL SPINE WITHOUT CONTRAST TECHNIQUE: Multidetector CT imaging of the cervical spine was performed without intravenous contrast. Multiplanar CT image reconstructions were also generated. COMPARISON:  None. FINDINGS: No fracture or traumatic malalignment. 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 Multilevel degenerative changes are noted. IMPRESSION: No fracture or traumatic malalignment.  Degenerative change. Electronically Signed   By: Dorise Bullion III M.D   On: 07/26/2015 20:53   Dg  Hip Unilat With Pelvis 2-3 Views Left  07/26/2015  CLINICAL DATA:  82 year old nursing home patient who is unresponsive but was found to have a left hip fracture on a mobile x-ray examination earlier today. EXAM: DG HIP (WITH OR WITHOUT PELVIS) 2-3V LEFT COMPARISON:  None. The mobile x-ray is  not available for comparison. FINDINGS: Acute comminuted subcapital left femoral neck fracture. Hip joint intact with mild joint space narrowing. Included AP pelvis demonstrates no fractures elsewhere. Symmetric mild joint space narrowing in the contralateral right hip. Sacroiliac joints symphysis pubis intact. Degenerative changes involving the visualized lower lumbar spine. IMPRESSION: Acute comminuted subcapital left femoral neck fracture. Electronically Signed   By: Evangeline Dakin M.D.   On: 07/26/2015 20:47    EKG: Independently reviewed. Vent. rate 94 BPM PR interval * ms QRS duration 160 ms QT/QTc 431/539 ms P-R-T axes 95 -71 101 Sinus rhythm Borderline prolonged PR interval Right atrial enlargement Left bundle branch block  Assessment/Plan Principal Problem:   Closed left hip fracture (HCC) Admit to telemetry/inpatient. Continue supplemental oxygen. Continue sedation as needed. Orthopedic surgery has been notified and will evaluate. Keep nothing by mouth. Briefly discuss findings and showed x-ray to the patient's niece.  Active Problems:   Hypothyroidism Resume levothyroxine postop.    Essential hypertension Stable. Resume antihypertensives after surgery.    Asthma As symptomatic at this time. Bronchodilators as needed.    GERD We will start IV famotidine.    Dementia Supportive care. Resume psychotropics medications after surgery.   DVT prophylaxis:  Code Status:  Family Communication:  Disposition Plan:  Consults called:  Admission status:    Reubin Milan MD [facility] Pager [phone].  If 7PM-7AM, please contact  night-coverage [email or web address] Password [facility]  07/26/2015, 10:15 PM

## 2015-07-27 ENCOUNTER — Inpatient Hospital Stay (HOSPITAL_COMMUNITY): Payer: Medicare Other

## 2015-07-27 ENCOUNTER — Other Ambulatory Visit: Payer: Self-pay | Admitting: Internal Medicine

## 2015-07-27 DIAGNOSIS — G9340 Encephalopathy, unspecified: Secondary | ICD-10-CM

## 2015-07-27 DIAGNOSIS — Z01810 Encounter for preprocedural cardiovascular examination: Secondary | ICD-10-CM

## 2015-07-27 DIAGNOSIS — R0600 Dyspnea, unspecified: Secondary | ICD-10-CM

## 2015-07-27 DIAGNOSIS — I447 Left bundle-branch block, unspecified: Secondary | ICD-10-CM

## 2015-07-27 DIAGNOSIS — S72002A Fracture of unspecified part of neck of left femur, initial encounter for closed fracture: Secondary | ICD-10-CM

## 2015-07-27 LAB — ECHOCARDIOGRAM COMPLETE
EERAT: 23.17
EWDT: 264 ms
FS: 32 % (ref 28–44)
IV/PV OW: 1.13
LA diam end sys: 26 mm
LA vol A4C: 39.8 ml
LADIAMINDEX: 1.42 cm/m2
LASIZE: 26 mm
LV E/e' medial: 23.17
LV PW d: 9.63 mm — AB (ref 0.6–1.1)
LV TDI E'LATERAL: 4.13
LVEEAVG: 23.17
LVELAT: 4.13 cm/s
MV Dec: 264
MV pk E vel: 95.7 m/s
MVPG: 4 mmHg
MVPKAVEL: 104 m/s
TAPSE: 21.2 mm
TDI e' medial: 6.96

## 2015-07-27 LAB — COMPREHENSIVE METABOLIC PANEL
ALBUMIN: 3.2 g/dL — AB (ref 3.5–5.0)
ALT: 31 U/L (ref 14–54)
ALT: 32 U/L (ref 14–54)
ANION GAP: 8 (ref 5–15)
AST: 33 U/L (ref 15–41)
AST: 33 U/L (ref 15–41)
Albumin: 3.3 g/dL — ABNORMAL LOW (ref 3.5–5.0)
Alkaline Phosphatase: 87 U/L (ref 38–126)
Alkaline Phosphatase: 105 U/L (ref 38–126)
Anion gap: 8 (ref 5–15)
BILIRUBIN TOTAL: 0.7 mg/dL (ref 0.3–1.2)
BUN: 16 mg/dL (ref 6–20)
BUN: 15 mg/dL (ref 6–20)
CHLORIDE: 106 mmol/L (ref 101–111)
CHLORIDE: 103 mmol/L (ref 101–111)
CO2: 23 mmol/L (ref 22–32)
CO2: 26 mmol/L (ref 22–32)
CREATININE: 0.76 mg/dL (ref 0.44–1.00)
Calcium: 9 mg/dL (ref 8.9–10.3)
Calcium: 8.9 mg/dL (ref 8.9–10.3)
Creatinine, Ser: 0.72 mg/dL (ref 0.44–1.00)
GFR calc Af Amer: >60 mL/min (ref >60)
GFR calc non Af Amer: >60 mL/min (ref >60)
Glucose, Bld: 174 mg/dL — ABNORMAL HIGH (ref 65–99)
Glucose, Bld: 163 mg/dL — ABNORMAL HIGH (ref 65–99)
POTASSIUM: 3.4 mmol/L — AB (ref 3.5–5.1)
POTASSIUM: 3.3 mmol/L — AB (ref 3.5–5.1)
Sodium: 137 mmol/L (ref 135–145)
Sodium: 137 mmol/L (ref 135–145)
TOTAL PROTEIN: 6 g/dL — AB (ref 6.5–8.1)
TOTAL PROTEIN: 6.5 g/dL (ref 6.5–8.1)
Total Bilirubin: 0.9 mg/dL (ref 0.3–1.2)

## 2015-07-27 LAB — CBC WITH DIFFERENTIAL/PLATELET
BASOS ABS: 0 10*3/uL (ref 0.0–0.1)
BASOS PCT: 0 %
Basophils Absolute: 0 10*3/uL (ref 0.0–0.1)
Basophils Relative: 0 %
EOS PCT: 1 %
EOS PCT: 1 %
Eosinophils Absolute: 0.1 10*3/uL (ref 0.0–0.7)
Eosinophils Absolute: 0.1 10*3/uL (ref 0.0–0.7)
HCT: 36.8 % (ref 36.0–46.0)
HEMATOCRIT: 34.1 % — AB (ref 36.0–46.0)
Hemoglobin: 11.5 g/dL — ABNORMAL LOW (ref 12.0–15.0)
Hemoglobin: 12.2 g/dL (ref 12.0–15.0)
LYMPHS ABS: 0.5 10*3/uL — AB (ref 0.7–4.0)
LYMPHS PCT: 4 %
Lymphocytes Relative: 3 %
Lymphs Abs: 0.4 10*3/uL — ABNORMAL LOW (ref 0.7–4.0)
MCH: 29.1 pg (ref 26.0–34.0)
MCH: 29.5 pg (ref 26.0–34.0)
MCHC: 33.7 g/dL (ref 30.0–36.0)
MCHC: 33.2 g/dL (ref 30.0–36.0)
MCV: 86.3 fL (ref 78.0–100.0)
MCV: 89.1 fL (ref 78.0–100.0)
MONO ABS: 1.1 10*3/uL — AB (ref 0.1–1.0)
MONO ABS: 1 10*3/uL (ref 0.1–1.0)
MONOS PCT: 8 %
MONOS PCT: 8 %
Neutro Abs: 11.4 10*3/uL — ABNORMAL HIGH (ref 1.7–7.7)
Neutro Abs: 10.4 10*3/uL — ABNORMAL HIGH (ref 1.7–7.7)
Neutrophils Relative %: 88 %
Neutrophils Relative %: 87 %
PLATELETS: 196 10*3/uL (ref 150–400)
PLATELETS: 209 10*3/uL (ref 150–400)
RBC: 3.95 MIL/uL (ref 3.87–5.11)
RBC: 4.13 MIL/uL (ref 3.87–5.11)
RDW: 13.1 % (ref 11.5–15.5)
RDW: 13.1 % (ref 11.5–15.5)
WBC: 13 10*3/uL — ABNORMAL HIGH (ref 4.0–10.5)
WBC: 12 10*3/uL — ABNORMAL HIGH (ref 4.0–10.5)

## 2015-07-27 LAB — BLOOD GAS, ARTERIAL
Acid-Base Excess: 1.8 mmol/L (ref 0.0–2.0)
BICARBONATE: 25.3 meq/L — AB (ref 20.0–24.0)
Drawn by: 281201
O2 Content: 2 L/min
O2 Saturation: 95 %
PCO2 ART: 36.4 mmHg (ref 35.0–45.0)
PO2 ART: 76 mmHg — AB (ref 80.0–100.0)
Patient temperature: 98.6
TCO2: 26.4 mmol/L (ref 0–100)
pH, Arterial: 7.457 — ABNORMAL HIGH (ref 7.350–7.450)

## 2015-07-27 LAB — LACTIC ACID, PLASMA
LACTIC ACID, VENOUS: 1.5 mmol/L (ref 0.5–1.9)
LACTIC ACID, VENOUS: 1.1 mmol/L (ref 0.5–1.9)

## 2015-07-27 LAB — GLUCOSE, CAPILLARY
GLUCOSE-CAPILLARY: 171 mg/dL — AB (ref 65–99)
GLUCOSE-CAPILLARY: 174 mg/dL — AB (ref 65–99)
GLUCOSE-CAPILLARY: 192 mg/dL — AB (ref 65–99)
GLUCOSE-CAPILLARY: 200 mg/dL — AB (ref 65–99)
Glucose-Capillary: 158 mg/dL — ABNORMAL HIGH (ref 65–99)

## 2015-07-27 LAB — APTT: APTT: 30 s (ref 24–37)

## 2015-07-27 LAB — PROCALCITONIN: Procalcitonin: 0.23 ng/mL

## 2015-07-27 LAB — PROTIME-INR
INR: 1.06 (ref 0.00–1.49)
Prothrombin Time: 14 seconds (ref 11.6–15.2)

## 2015-07-27 LAB — SURGICAL PCR SCREEN
MRSA, PCR: NEGATIVE
STAPHYLOCOCCUS AUREUS: NEGATIVE

## 2015-07-27 LAB — TSH: TSH: 2.624 u[IU]/mL (ref 0.350–4.500)

## 2015-07-27 MED ORDER — KETOROLAC TROMETHAMINE 15 MG/ML IJ SOLN
15.0000 mg | Freq: Four times a day (QID) | INTRAMUSCULAR | Status: AC | PRN
  Administered 2015-07-27 – 2015-08-01 (×10): 15 mg via INTRAVENOUS
  Filled 2015-07-27 (×9): qty 1

## 2015-07-27 MED ORDER — LACTATED RINGERS IV SOLN: INTRAVENOUS | Status: DC

## 2015-07-27 MED ORDER — PIPERACILLIN-TAZOBACTAM 3.375 G IVPB 30 MIN
3.3750 g | Freq: Once | INTRAVENOUS | Status: AC
  Administered 2015-07-27: 3.375 g via INTRAVENOUS
  Filled 2015-07-27: qty 50

## 2015-07-27 MED ORDER — DEXTROSE-NACL 5-0.45 % IV SOLN
100.0000 mL/h | INTRAVENOUS | Status: DC
  Administered 2015-07-27: 100 mL/h via INTRAVENOUS

## 2015-07-27 MED ORDER — VANCOMYCIN HCL IN DEXTROSE 1-5 GM/200ML-% IV SOLN
1000.0000 mg | INTRAVENOUS | Status: DC
  Filled 2015-07-27: qty 200

## 2015-07-27 MED ORDER — CEFAZOLIN SODIUM-DEXTROSE 2-4 GM/100ML-% IV SOLN: 2.0000 g | INTRAVENOUS | Status: DC

## 2015-07-27 MED ORDER — VANCOMYCIN HCL 10 G IV SOLR
1500.0000 mg | Freq: Once | INTRAVENOUS | Status: AC
  Administered 2015-07-27: 1500 mg via INTRAVENOUS
  Filled 2015-07-27: qty 1500

## 2015-07-27 MED ORDER — INSULIN ASPART 100 UNIT/ML ~~LOC~~ SOLN
0.0000 [IU] | Freq: Every day | SUBCUTANEOUS | Status: DC
  Administered 2015-07-28: 2 [IU] via SUBCUTANEOUS
  Administered 2015-07-29: 5 [IU] via SUBCUTANEOUS

## 2015-07-27 MED ORDER — PIPERACILLIN-TAZOBACTAM 3.375 G IVPB
3.3750 g | Freq: Three times a day (TID) | INTRAVENOUS | Status: DC
  Administered 2015-07-27 – 2015-07-30 (×7): 3.375 g via INTRAVENOUS
  Filled 2015-07-27 (×10): qty 50

## 2015-07-27 MED ORDER — NALOXONE HCL 0.4 MG/ML IJ SOLN
INTRAMUSCULAR | Status: AC
  Administered 2015-07-27: 0.2 mg
  Filled 2015-07-27: qty 1

## 2015-07-27 MED ORDER — GLUCERNA SHAKE PO LIQD
237.0000 mL | Freq: Three times a day (TID) | ORAL | Status: DC
  Administered 2015-07-27 – 2015-08-01 (×10): 237 mL via ORAL

## 2015-07-27 MED ORDER — INSULIN ASPART 100 UNIT/ML ~~LOC~~ SOLN
0.0000 [IU] | Freq: Three times a day (TID) | SUBCUTANEOUS | Status: DC
  Administered 2015-07-27: 2 [IU] via SUBCUTANEOUS
  Administered 2015-07-28: 1 [IU] via SUBCUTANEOUS
  Administered 2015-07-29: 5 [IU] via SUBCUTANEOUS
  Administered 2015-07-29 – 2015-07-30 (×2): 2 [IU] via SUBCUTANEOUS
  Administered 2015-07-30: 1 [IU] via SUBCUTANEOUS
  Administered 2015-08-01: 2 [IU] via SUBCUTANEOUS
  Administered 2015-08-01: 1 [IU] via SUBCUTANEOUS

## 2015-07-27 NOTE — Progress Notes (Signed)
Patient has not voided since in and out cath done in ED. Bladder scan showed ~175ml. Patient showing no signs of bladder discomfort. Will recheck patient within appropriate time.

## 2015-07-27 NOTE — Progress Notes (Addendum)
PROGRESS NOTE  [name] [name] [id number] DOB: August 25, 1929 DOA: 07/26/2015 PCP: Alesia Richards, MD   LOS: 1 day   Brief Narrative: 82 y.o. female with medical history significant of asthma, type 2 diabetes, hyperlipidemia, hypertension, chronic headaches, COPD, pulmonary fibrosis, B12 deficiency, vitamin D deficiency, depression, Alzheimer's dementia, hypothyroidism who is usually ambulatory at the nursing home facility where she lives and comes via EMS to the emergency department due to having a left hip fracture  Assessment & Plan: Principal Problem:   Closed left hip fracture ([place]) Active Problems:   Hypothyroidism   Essential hypertension   Asthma   GERD   Dementia  Acute encephalopathy - Patient with progressive lethargy today, rapid response called, patient reevaluated around noon, she does respond some however remains drowsy. Did receive dilaudid for pain, we'll attempt Narcan.  - Obtain an ABG - Obtain chest x-ray   Addendum: paged by RN that patient is febrile, wonder if this is early sepsis, obtain cultures, empiric antibiotics, obtain lactic acid.   LBBB  - obtain 2D echo, I have asked cardiology to evaluate for pre-op. She did have a stress test in 2015 which had a small area worrisome for ischemia - appreciate cardiology input - has some crackles on exam and CXR   Closed left hip fracture [facility]) - Orthopedic surgery consulted, appreciate input. The plan is to tentatively to do hip repair tomorrow pending cardiology clearance  Hypothyroidism - Resume levothyroxine postop.  Essential hypertension - Resume antihypertensives after surgery.  Asthma - As symptomatic at this time. - Bronchodilators as needed.  GERD  Dementia - Supportive care. - Resume psychotropics medications after surgery.   DVT prophylaxis: heparin Code Status: DNR Family Communication: d/w daughter bedside Disposition Plan: TBD  Consultants:   Orthopedic  surgery  Cardiology  Procedures:   2D echo  Foley  Antimicrobials:  None    Subjective: - lethargic, drowsy. Able to minimally nod her head to some questions  Objective: Filed Vitals:   07/26/15 2115 07/26/15 2210 07/27/15 0230 07/27/15 0600  BP: 168/74 169/76 166/70 158/76  Pulse: 89 89 84 78  Temp:  97.4 F (36.3 C) 98.4 F (36.9 C) 98.5 F (36.9 C)  TempSrc:  Oral Axillary Axillary  Resp:  18 17 17   SpO2: 98% 95% 97% 97%   No intake or output data in the 24 hours ending 07/27/15 1257 There were no vitals filed for this visit.  Examination: Constitutional: lethargic, flushed  Filed Vitals:   07/26/15 2115 07/26/15 2210 07/27/15 0230 07/27/15 0600  BP: 168/74 169/76 166/70 158/76  Pulse: 89 89 84 78  Temp:  97.4 F (36.3 C) 98.4 F (36.9 C) 98.5 F (36.9 C)  TempSrc:  Oral Axillary Axillary  Resp:  18 17 17   SpO2: 98% 95% 97% 97%   Eyes: PERRL Respiratory: + crackles, no wheezing. Shallowl respiratory effort. No accessory muscle use.  Cardiovascular: Regular rate and rhythm, no murmurs / rubs / gallops. Trace LE edema. 2+ pedal pulses. Abdomen: no tenderness. Bowel sounds positive.  Musculoskeletal: no clubbing / cyanosis.  Skin: no rashes, lesions, ulcers. No induration Neurologic: Not following commands consistently    Data Reviewed: I have personally reviewed following labs and imaging studies  CBC:  Recent Labs Lab 07/26/15 2001 07/27/15 0740  WBC 14.1* 13.0*  NEUTROABS 11.9* 11.4*  HGB 12.2 11.5*  HCT 35.7* 34.1*  MCV 87.3 86.3  PLT 226 123456   Basic Metabolic Panel:  Recent Labs Lab 07/26/15 2001 07/27/15 0740  NA 135 137  K 3.5 3.4*  CL 103 106  CO2 23 23  GLUCOSE 202* 174*  BUN 12 16  CREATININE 0.73 0.72  CALCIUM 9.2 9.0   GFR: CrCl cannot be calculated (Unknown ideal weight.). Liver Function Tests:  Recent Labs Lab 07/26/15 2001 07/27/15 0740  AST 38 33  ALT 34 31  ALKPHOS 95 87  BILITOT 0.9 0.7  PROT 6.7 6.0*   ALBUMIN 3.6 3.2*   No results for input(s): LIPASE, AMYLASE in the last 168 hours. No results for input(s): AMMONIA in the last 168 hours. Coagulation Profile:  Recent Labs Lab 07/26/15 2001  INR 1.13   Cardiac Enzymes: No results for input(s): CKTOTAL, CKMB, CKMBINDEX, TROPONINI in the last 168 hours. BNP (last 3 results) No results for input(s): PROBNP in the last 8760 hours. HbA1C: No results for input(s): HGBA1C in the last 72 hours. CBG:  Recent Labs Lab 07/27/15 0010 07/27/15 0653 07/27/15 1149  GLUCAP 192* 171* 200*   Lipid Profile: No results for input(s): CHOL, HDL, LDLCALC, TRIG, CHOLHDL, LDLDIRECT in the last 72 hours. Thyroid Function Tests: No results for input(s): TSH, T4TOTAL, FREET4, T3FREE, THYROIDAB in the last 72 hours. Anemia Panel: No results for input(s): VITAMINB12, FOLATE, FERRITIN, TIBC, IRON, RETICCTPCT in the last 72 hours. Urine analysis:    Component Value Date/Time   COLORURINE YELLOW 07/26/2015 2114   APPEARANCEUR CLEAR 07/26/2015 2114   LABSPEC 1.018 07/26/2015 2114   PHURINE 7.5 07/26/2015 2114   GLUCOSEU 500* 07/26/2015 2114   HGBUR NEGATIVE 07/26/2015 2114   BILIRUBINUR NEGATIVE 07/26/2015 2114   KETONESUR 15* 07/26/2015 2114   PROTEINUR 100* 07/26/2015 2114   UROBILINOGEN 0.2 08/04/2014 1544   NITRITE NEGATIVE 07/26/2015 2114   LEUKOCYTESUR NEGATIVE 07/26/2015 2114   Sepsis Labs: Invalid input(s): PROCALCITONIN, LACTICIDVEN  No results found for this or any previous visit (from the past 240 hour(s)).    Radiology Studies: Dg Chest 1 View  07/26/2015  CLINICAL DATA:  Left femoral neck fracture. Preoperative respiratory evaluation. EXAM: CHEST 1 VIEW COMPARISON:  08/04/2014 and earlier. FINDINGS: AP supine examination was performed. Suboptimal inspiration. Cardiac silhouette mildly to moderately enlarged, unchanged. Pulmonary venous hypertension with perhaps mild perihilar pulmonary edema. Lungs otherwise clear. No pleural  effusions. IMPRESSION: Stable cardiomegaly.  Possible mild perihilar pulmonary edema. Electronically Signed   By: Evangeline Dakin M.D.   On: 07/26/2015 20:48   Ct Head Wo Contrast  07/26/2015  CLINICAL DATA:  Fall.  Trauma. EXAM: CT HEAD WITHOUT CONTRAST TECHNIQUE: Contiguous axial images were obtained from the base of the skull through the vertex without intravenous contrast. COMPARISON:  February 22, 2014 FINDINGS: No subdural, epidural, or subarachnoid hemorrhage. Cerebellum, brainstem, and basal cisterns are normal. Moderate white matter changes are stable. Ventricles and sulci are unchanged. No acute cortical ischemia or infarct. Basal ganglia calcifications are noted. IMPRESSION: No acute intracranial process. Electronically Signed   By: Dorise Bullion III M.D   On: 07/26/2015 20:43   Ct Cervical Spine Wo Contrast  07/26/2015  CLINICAL DATA:  Pain after fall. EXAM: CT CERVICAL SPINE WITHOUT CONTRAST TECHNIQUE: Multidetector CT imaging of the cervical spine was performed without intravenous contrast. Multiplanar CT image reconstructions were also generated. COMPARISON:  None. FINDINGS: No fracture or traumatic malalignment. 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 Multilevel degenerative changes are noted. IMPRESSION: No fracture or traumatic malalignment.  Degenerative change. Electronically Signed   By: Dorise Bullion III M.D   On: 07/26/2015 20:53   Dg  Hip Unilat With Pelvis 2-3 Views Left  07/26/2015  CLINICAL DATA:  82 year old nursing home patient who is unresponsive but was found to have a left hip fracture on a mobile x-ray examination earlier today. EXAM: DG HIP (WITH OR WITHOUT PELVIS) 2-3V LEFT COMPARISON:  None. The mobile x-ray is not available for comparison. FINDINGS: Acute comminuted subcapital left femoral neck fracture. Hip joint intact with mild joint space narrowing. Included AP pelvis demonstrates no fractures elsewhere. Symmetric mild joint space narrowing in the  contralateral right hip. Sacroiliac joints symphysis pubis intact. Degenerative changes involving the visualized lower lumbar spine. IMPRESSION: Acute comminuted subcapital left femoral neck fracture. Electronically Signed   By: Evangeline Dakin M.D.   On: 07/26/2015 20:47     Scheduled Meds: . [START ON 07/28/2015]  ceFAZolin (ANCEF) IV  2 g Intravenous To SS-Surg  . feeding supplement (GLUCERNA SHAKE)  237 mL Oral TID BM  . heparin  5,000 Units Subcutaneous Q8H  . insulin aspart  0-5 Units Subcutaneous QHS  . insulin aspart  0-9 Units Subcutaneous TID WC  . naloxone      . sodium chloride flush  3 mL Intravenous Q12H   Continuous Infusions: . lactated ringers         Time spent: 35 minutes    Marzetta Board, MD, PhD [facility] Pager [phone]  If 7PM-7AM, please contact night-coverage [email or web address] Password [facility] 07/27/2015, 12:57 PM

## 2015-07-27 NOTE — Progress Notes (Signed)
Nurse contacted by [place] Monitoring patient experiencing V-Tach. Nurse called rapid response nurse due to patient's breathing and irregular heart rhythm. MD informed. Patient blood glucose 200. Temperature Axillary 98.2, blood pressure 163/64.

## 2015-07-27 NOTE — Progress Notes (Signed)
Patient is to only have Tordol for pain, per MD due to her LOC earlier. Patient was given Narcan earlier and is now more alert with family and nurse.

## 2015-07-27 NOTE — Significant Event (Addendum)
Rapid Response Event Note  Overview: Called to see pt for VT and guppy breathing Time Called: 1232 Arrival Time: 1237 Event Type: Respiratory, Cardiac  Initial Focused Assessment: Pt sleeping, RR even and unlabored, moves extremities, moans, and flickers eyes w/touch of cold hands. Moans in response to questions. Cheeks flushed and warm to touch. BP 163/64, HR 96, O2 98% [place], RR 20.    Interventions: Rectal temp 100.9, Narcan 0.2 mg given per MD order. Dr. Cruzita Lederer at bedside, sepsis protocol initiated. 12 lead reviewed occasional PVC, wide complex regular rate w/P waves appears to be LBBB also seen on previous ED 12 lead.  Plan of Care (if not transferred): Complete new orders, address pain appropriately, call as needed. Hand off given to [place]   Event Summary: Name of Physician Notified: Dr. Cruzita Lederer at [phone]    at    Outcome: Stayed in room and stabalized  Event End Time: [place], Sela Hua

## 2015-07-27 NOTE — Consult Note (Signed)
Cardiology Consult    Patient ID: [name] [name] MRN: [id number], DOB/AGE: 80-Jul-1931   Admit date: 07/26/2015 Date of Consult: 07/27/2015  Primary Physician: Alesia Richards, MD Reason for Consult: Preoperative Clearance Primary Cardiologist: Previously Dr. Ron Parker Requesting Provider: Dr. Cruzita Lederer   History of Present Illness    [name] [name] is a 82 y.o. female with past medical history of Dementia, Type 2 DM, pulmonary fibrosis, COPD, hypothyroidism, and Stage 3 CKD who presented to [facility] ED on 07/26/2015 after having an outpatient X-ray showing a left hip fracture.   The patient currently resides at [facility] and is ambulatory, but was noted to be less active and complaining of left hip pain on 07/26/2015. An x-ray was obtained which showed a left femoral neck fracture and she was transported to [facility] for further evaluation.   Orthopedics has been consulted and are planning to perform a left hip hemiarthroplasty tomorrow. Cardiology is therefore consulted for preoperative clearance.  EKG on admission showed NSR, HR 94, with a LBBB. This is new from her previous EKG in 01/2015. On telemetry she has been having wide-complexes, consistent with her known LBBB. These have been labeled as ventricular tachycardia but the rhythm strips do not match with this, for p-waves are noted and the rhythm is not tachycardiac. Rapid response was called earlier today when she was labeled as being in VT and had labored breathing. In looking at strips during that time, this was again a wide-complex rhythm with p-waves present and not VT.  Her last ischemic evaluation was a NST in 10/2013. The report showed a subtle area of decreased uptake in the anterior wall worrisome for a small area of ischemia. EF was estimated at 73% and was overall an intermediate-risk study. The images were reviewed by Dr. Sallyanne Kuster was he stated "they are not compelling for a true abnormality. If it is present, the  defect is limited in size and mild in severity. I would continue with medical therapy for now. If symptoms recur, best option will then be coronary angiography".  In talking with the patient today, she is currently very sedated likely secondary to pain medications she has been receiving for left hip pain. She does not open her eyes throughout the encounter but does display arm movements. Her daughter-in-law is at the bedside who provides most of the history. She is able to confirm the patient does not have a history of known CAD or previous MI's. Not aware of her complaining of any recent chest pain. She did have what they thought was a "TIA" last week due to weakness and decreased activity with her symptoms resolving within one hour. She is ambulatory at baseline with a walker, "always on the go" according to family. They are unaware of how her hip fracture occurred, for no witnessed falls had been reported.  Past Medical History   Past Medical History  Diagnosis Date  . Asthma   . Type II or unspecified type diabetes mellitus without mention of complication, not stated as uncontrolled   . Hyperlipidemia   . Chronic headaches   . Hypertension   . Pulmonary fibrosis (HCC)     Chronic granulomatous changes on CT chest, minimal mediastinal LAN 3/09  . COPD (chronic obstructive pulmonary disease) ([place])   . Vitamin D deficiency   . B12 deficiency   . Allergy   . Dementia   . Depression   . Hypothyroidism   . CKD (chronic kidney disease) stage 3, GFR  30-59 ml/min     Past Surgical History  Procedure Laterality Date  . Appendectomy    . Thyroidectomy    . Tubal ligation    . Esophagogastroduodenoscopy  2012    Gastritis  . Breast biopsy      right breast/benign  . Endometrial biopsy  1998    negative     Allergies  Allergies  Allergen Reactions  . Iron     GI  . Meloxicam Other (See Comments)    Abd pain  . Meperidine Hcl Other (See Comments)    agitated  . Pravastatin      myalgias  . Zolpidem Tartrate Other (See Comments)    confusion  . Codeine Palpitations    "heart raced"    Inpatient Medications    . feeding supplement (GLUCERNA SHAKE)  237 mL Oral TID BM  . heparin  5,000 Units Subcutaneous Q8H  . insulin aspart  0-5 Units Subcutaneous QHS  . insulin aspart  0-9 Units Subcutaneous TID WC  . piperacillin-tazobactam  3.375 g Intravenous Once  . piperacillin-tazobactam (ZOSYN)  IV  3.375 g Intravenous Q8H  . sodium chloride flush  3 mL Intravenous Q12H  . vancomycin  1,500 mg Intravenous Once  . [START ON 07/28/2015] vancomycin  1,000 mg Intravenous Q24H    Family History    Family History  Problem Relation Age of Onset  . Asthma Sister   . Cancer Sister     breast, colon  . Diabetes Mother   . Cancer Mother     breast  . Diabetes Sister   . Cancer Sister 80    breast  . Heart disease Father   . Hypertension Father   . Cancer Brother     leukemia  . Cancer Brother     lung  . Hypertension Brother   . Hyperlipidemia Brother     Social History    Social History   Social History  . Marital Status: Widowed    Spouse Name: N/A  . Number of Children: N/A  . Years of Education: N/A   Occupational History  . Not on file.   Social History Main Topics  . Smoking status: Never Smoker   . Smokeless tobacco: Never Used  . Alcohol Use: No  . Drug Use: No  . Sexual Activity: Not on file   Other Topics Concern  . Not on file   Social History Narrative     Review of Systems    Unable to be obtained secondary to dementia and sedation (likely secondary to medications).   Physical Exam    Blood pressure 163/64, pulse 78, temperature 100.9 F (38.3 C), temperature source Rectal, resp. rate 17, SpO2 97 %.  General: Elderly Caucasian female appearing in NAD.  Psych: Normal affect. Neuro: Unable to be assessed as she has dementia and is currently sedated secondary to medications.  HEENT: Normal  Neck: Supple without bruits or  JVD. Lungs:  Resp regular and unlabored, CTA without wheezing or rales. Heart: RRR no s3, s4, or murmurs. Abdomen: Soft, non-tender, non-distended, BS + x 4.  Extremities: No clubbing, cyanosis or edema. DP/PT/Radials 2+ and equal bilaterally.  Labs    Troponin (Point of Care Test) No results for input(s): TROPIPOC in the last 72 hours. No results for input(s): CKTOTAL, CKMB, TROPONINI in the last 72 hours. Lab Results  Component Value Date   WBC 13.0* 07/27/2015   HGB 11.5* 07/27/2015   HCT 34.1* 07/27/2015   MCV 86.3  07/27/2015   PLT 196 07/27/2015    Recent Labs Lab 07/27/15 0740  NA 137  K 3.4*  CL 106  CO2 23  BUN 16  CREATININE 0.72  CALCIUM 9.0  PROT 6.0*  BILITOT 0.7  ALKPHOS 87  ALT 31  AST 33  GLUCOSE 174*   Lab Results  Component Value Date   CHOL 272* 12/20/2014   HDL 90 12/20/2014   LDLCALC 140* 12/20/2014   TRIG 209* 12/20/2014   Lab Results  Component Value Date   DDIMER * 03/15/2007    1.40        AT THE INHOUSE ESTABLISHED CUTOFF VALUE OF 0.48 ug/mL FEU, THIS ASSAY HAS BEEN DOCUMENTED IN THE LITERATURE TO HAVE     Radiology Studies    Dg Chest 1 View: 07/26/2015  CLINICAL DATA:  Left femoral neck fracture. Preoperative respiratory evaluation. EXAM: CHEST 1 VIEW COMPARISON:  08/04/2014 and earlier. FINDINGS: AP supine examination was performed. Suboptimal inspiration. Cardiac silhouette mildly to moderately enlarged, unchanged. Pulmonary venous hypertension with perhaps mild perihilar pulmonary edema. Lungs otherwise clear. No pleural effusions. IMPRESSION: Stable cardiomegaly.  Possible mild perihilar pulmonary edema. Electronically Signed   By: Evangeline Dakin M.D.   On: 07/26/2015 20:48   Ct Head Wo Contrast: 07/26/2015  CLINICAL DATA:  Fall.  Trauma. EXAM: CT HEAD WITHOUT CONTRAST TECHNIQUE: Contiguous axial images were obtained from the base of the skull through the vertex without intravenous contrast. COMPARISON:  February 22, 2014  FINDINGS: No subdural, epidural, or subarachnoid hemorrhage. Cerebellum, brainstem, and basal cisterns are normal. Moderate white matter changes are stable. Ventricles and sulci are unchanged. No acute cortical ischemia or infarct. Basal ganglia calcifications are noted. IMPRESSION: No acute intracranial process. Electronically Signed   By: Dorise Bullion III M.D   On: 07/26/2015 20:43   Dg Chest Port 1 View: 07/27/2015  CLINICAL DATA:  Sepsis EXAM: PORTABLE CHEST 1 VIEW COMPARISON:  08/18/2015 FINDINGS: Chronic cardiomegaly and main pulmonary artery enlargement. Interstitial coarsening that is similar to prior, without effusion or Kerley line. No convincing consolidation, limited behind the heart on this portable study. Symmetric biapical pleural thickening. IMPRESSION: No convincing pneumonia. Cardiomegaly and bronchitic or congestive interstitial coarsening. Electronically Signed   By: Monte Fantasia M.D.   On: 07/27/2015 13:59   Dg Hip Unilat With Pelvis 2-3 Views Left: 07/26/2015  CLINICAL DATA:  82 year old nursing home patient who is unresponsive but was found to have a left hip fracture on a mobile x-ray examination earlier today. EXAM: DG HIP (WITH OR WITHOUT PELVIS) 2-3V LEFT COMPARISON:  None. The mobile x-ray is not available for comparison. FINDINGS: Acute comminuted subcapital left femoral neck fracture. Hip joint intact with mild joint space narrowing. Included AP pelvis demonstrates no fractures elsewhere. Symmetric mild joint space narrowing in the contralateral right hip. Sacroiliac joints symphysis pubis intact. Degenerative changes involving the visualized lower lumbar spine. IMPRESSION: Acute comminuted subcapital left femoral neck fracture. Electronically Signed   By: Evangeline Dakin M.D.   On: 07/26/2015 20:47    EKG & Cardiac Imaging    EKG: NSR, HR 94, with a LBBB (new since previous EKG in 01/2015)  Echocardiogram: 11/05/2013 Study Conclusions - Left ventricle: The cavity size  was normal. Wall thickness was normal. Systolic function was normal. The estimated ejection fraction was in the range of 55% to 60%. Wall motion was normal; there were no regional wall motion abnormalities. - Pulmonary arteries: Systolic pressure was mildly increased. PA peak pressure: 44 mm Hg (  S).  Nuclear Stress Test: 10/2013 EXAM: MYOCARDIAL IMAGING WITH SPECT (REST AND PHARMACOLOGIC-STRESS)  GATED LEFT VENTRICULAR WALL MOTION STUDY  LEFT VENTRICULAR EJECTION FRACTION  TECHNIQUE: Standard myocardial SPECT imaging was performed after resting intravenous injection of 10 mCi Tc-55m sestamibi. Subsequently, intravenous infusion of Lexiscan was performed under the supervision of the Cardiology staff. At peak effect of the drug, 30 mCi Tc-73m sestamibi was injected intravenously and standard myocardial SPECT imaging was performed. Quantitative gated imaging was also performed to evaluate left ventricular wall motion, and estimate left ventricular ejection fraction.  COMPARISON: None.  FINDINGS: Baseline EKG: NSR with no ST changes  EKG during Lexiscan infusion: NSR with wandering baseline artifact. Occasional PVC's. No acute ST changes. No chest pain or SOB  Raw images demonstrate increased gut uptake below the diaphragm and mild diaphragmatic attenuation.  Perfusion: Small in size, mild in intensity defect in the anterior wall worrisome for a subtle area of ischemia.  Wall Motion: Normal left ventricular wall motion. No left ventricular dilation.  Left Ventricular Ejection Fraction: 73 %  End diastolic volume 50 ml  End systolic volume 13 ml  IMPRESSION: 1. Subtle area of decreased uptake in the anterior wall worrisome for a small area or ischemia.  2. Normal left ventricular wall motion.  3. Left ventricular ejection fraction 73%  4. Intermediate-risk stress test findings*.   Assessment & Plan    1. Preoperative Clearance for Left  Hip Hemiarthroplasty - presented with a left hip fracture, unknown when fall possibly occurred. Past medical history significant for dementia, Type 2 DM, pulmonary fibrosis, COPD, hypothyroidism, and Stage 3 CKD. No known past MI's or recent anginal symptoms.  - last ischemic evaluation was a NST in 10/2013 with the report showing a subtle area of decreased uptake in the anterior wall worrisome for a small area of ischemia but this was reviewed by Dr. Sallyanne Kuster and he stated "the images are not compelling for a true abnormality. If it is present, the defect is limited in size and mild in severity. I would continue with medical therapy for now. If symptoms recur, best option will then be coronary angiography".  - EKG on admission showed NSR, HR 94, with a LBBB (new since previous tracings). Echo has been obtained with official read pending.  - she would likely be of moderate to high-risk for the procedure due to age, new LBBB, and known COPD and pulmonary fibrosis. However with the risks and benefits of the surgical procedure, would not pursue further cardiac evaluation prior to surgery for QOL would be significantly decreased if hip fx was left unrepaired. Final assessment to come from MD.  2. New LBBB - new when compared to her previous EKG in 01/2015.  - on telemetry she has been having wide-complex QRS patterns, consistent with her known LBBB. These have been labeled as ventricular tachycardia but the rhythm strips do not match with this, for p-waves are noted and the rhythm is not tachycardiac.  3. Dementia - unable to obtain recent ROS. History provided by review of the chart and patient's daughter-in-law at the bedside.    Signed, Erma Heritage, PA-C 07/27/2015, 2:42 PM Pager: [phone]  Patient seen, examined. Available data reviewed. Agree with findings, assessment, and plan as outlined by Bernerd Pho, PA-C. Elderly woman in NAD, somnolent. JVP normal, no carotid bruits, lungs  CTA, heart RRR without murmur, abdomen soft, +BS, no leg swelling. EKG/telemetry review shows NSR with LBBB, PVC's (single). Echo images reviewed, with formal interpretation pending.  On my review this shows normal LV systolic function and no significant valvular abnormalities. I have obtained history from the patient's daughter-in-law. The patient has advanced dementia, but is still physically active. She has reported no symptoms of chest pain or dyspnea with activity. I do not think she has any high-risk cardiac features for hip surgery and it is ok to proceed without further testing at this time. Her most recent nuclear scan is reviewed.   Sherren Mocha, M.D. 07/27/2015 5:25 PM

## 2015-07-27 NOTE — Progress Notes (Signed)
*  PRELIMINARY RESULTS* Echocardiogram 2D Echocardiogram has been performed.  Leavy Cella 07/27/2015, 1:39 PM

## 2015-07-27 NOTE — Consult Note (Signed)
ORTHOPAEDIC CONSULTATION  REQUESTING PHYSICIAN: Caren Griffins, MD  Chief Complaint: left hip pain s/p presumed fall at nursing home / memory care on 07/26/15   Assessment: Principal Problem:   Closed left hip fracture [facility]) Active Problems:   Hypothyroidism   Essential hypertension   Asthma   GERD   Dementia  Acute comminuted subcapital left femoral neck fracture.  Hemiarthroplasty is indicated  Plan: NPO after midnight. Left Hip Hemiarthroplasty planned for P.M. 07/28/15. Weight Bearing Status: NWB.  Plan for WBAT post op VTE px: SCD's and heparin per primary.  HPI: [name] [name] is a 82 y.o. female with a history of asthma, type 2 diabetes, hyperlipidemia, hypertension, chronic headaches, COPD, pulmonary fibrosis, B12 deficiency, vitamin D deficiency, depression, Alzheimer's dementia, hypothyroidism who is usually ambulatory at the memory nursing home facility.    She complains of left hip pain after presumed fall at nursing home / memory care on 07/26/15.  XR shows Acute comminuted subcapital left femoral neck fracture.  Orthopedics was consulted for evaluation.   Past Medical History  Diagnosis Date  . Asthma   . Type II or unspecified type diabetes mellitus without mention of complication, not stated as uncontrolled   . Hyperlipidemia   . Chronic headaches   . Hypertension   . Pulmonary fibrosis (HCC)     Chronic granulomatous changes on CT chest, minimal mediastinal LAN 3/09  . COPD (chronic obstructive pulmonary disease) ([place])   . Vitamin D deficiency   . B12 deficiency   . Allergy   . Dementia   . Depression   . Hypothyroidism   . CKD (chronic kidney disease) stage 3, GFR 30-59 ml/min    Past Surgical History  Procedure Laterality Date  . Appendectomy    . Thyroidectomy    . Tubal ligation    . Esophagogastroduodenoscopy  2012    Gastritis  . Breast biopsy      right breast/benign  . Endometrial biopsy  1998    negative   Social History    Social History  . Marital Status: Widowed    Spouse Name: N/A  . Number of Children: N/A  . Years of Education: N/A   Social History Main Topics  . Smoking status: Never Smoker   . Smokeless tobacco: Never Used  . Alcohol Use: No  . Drug Use: No  . Sexual Activity: Not Asked   Other Topics Concern  . None   Social History Narrative   Family History  Problem Relation Age of Onset  . Asthma Sister   . Cancer Sister     breast, colon  . Diabetes Mother   . Cancer Mother     breast  . Diabetes Sister   . Cancer Sister 46    breast  . Heart disease Father   . Hypertension Father   . Cancer Brother     leukemia  . Cancer Brother     lung  . Hypertension Brother   . Hyperlipidemia Brother    Allergies  Allergen Reactions  . Iron     GI  . Meloxicam Other (See Comments)    Abd pain  . Meperidine Hcl Other (See Comments)    agitated  . Pravastatin     myalgias  . Zolpidem Tartrate Other (See Comments)    confusion  . Codeine Palpitations    "heart raced"   Prior to Admission medications   Medication Sig Start Date End Date Taking? Authorizing Provider  clonazePAM (  KLONOPIN) 0.5 MG tablet Take 0.25 mg by mouth daily as needed for anxiety.   Yes Historical Provider, MD  clonazePAM (KLONOPIN) 1 MG tablet Take 1 mg by mouth at bedtime.   Yes Historical Provider, MD  Cranberry 425 MG CAPS Take 1 capsule by mouth daily.   Yes Historical Provider, MD  docusate sodium (COLACE) 100 MG capsule Take 100 mg by mouth daily.   Yes Historical Provider, MD  levothyroxine (SYNTHROID, LEVOTHROID) 25 MCG tablet Take 25 mcg by mouth daily before breakfast.   Yes Historical Provider, MD  loratadine (CLARITIN) 10 MG tablet Take 10 mg by mouth every morning.    Yes Historical Provider, MD  Melatonin 10 MG CAPS Take 1 capsule by mouth daily.   Yes Historical Provider, MD  sertraline (ZOLOFT) 100 MG tablet Take 100 mg by mouth daily. Take along with the 84m tablet to make a total  dose of 1238mdaily   Yes Historical Provider, MD  sertraline (ZOLOFT) 25 MG tablet Take 25 mg by mouth daily. Take along with the 10080mablet to make a total of 125m60mily   Yes Historical Provider, MD  traZODone (DESYREL) 100 MG tablet Take 100 mg by mouth at bedtime.   Yes Historical Provider, MD  verapamil (CALAN-SR) 240 MG CR tablet take 1 tablet by mouth once daily for blood pressure 01/06/15  Yes WillUnk Pinto  benazepril (LOTENSIN) 10 MG tablet Take 1 tablet (10 mg total) by mouth daily. Patient not taking: Reported on 07/26/2015 06/16/14   CourStarlyn Skeans-C  citalopram (CELEXA) 20 MG tablet Take 1 tablet daily Patient not taking: Reported on 07/26/2015 09/23/14   WillUnk Pinto  fluconazole (DIFLUCAN) 150 MG tablet Take 1 tablet 2 times a week, on Monday and Thursday, for 4 weeks. Patient not taking: Reported on 07/26/2015 03/10/15   WillUnk Pinto  haloperidol (HALDOL) 5 MG tablet Take 1 tablet 1 x day with Supper (5pm)  and q4hr prn agitation. (Max 6 total doses/24 hr) Hold supper dose if patient sedate Patient not taking: Reported on 07/26/2015 03/02/15   WillUnk Pinto  hydrochlorothiazide (HYDRODIURIL) 25 MG tablet Take 1 tablet (25 mg total) by mouth daily. Patient not taking: Reported on 07/26/2015 05/12/15   WillUnk Pinto  levothyroxine (SYNTHROID, LEVOTHROID) 75 MCG tablet take 1 tablet by mouth once daily Patient not taking: Reported on 07/26/2015 07/27/14   WillUnk Pinto  metFORMIN (GLUCOPHAGE-XR) 500 MG 24 hr tablet take 1 to 2 tablets by mouth twice a day after meals Patient not taking: Reported on 07/26/2015 03/31/14   WillUnk Pinto  traZODone (DESYREL) 150 MG tablet Take 1/2   tablet daily at 5PM Patient not taking: Reported on 07/26/2015 03/02/15   WillUnk Pinto   Dg Chest 1 View  07/26/2015  CLINICAL DATA:  Left femoral neck fracture. Preoperative respiratory evaluation. EXAM: CHEST 1 VIEW COMPARISON:  08/04/2014 and earlier. FINDINGS:  AP supine examination was performed. Suboptimal inspiration. Cardiac silhouette mildly to moderately enlarged, unchanged. Pulmonary venous hypertension with perhaps mild perihilar pulmonary edema. Lungs otherwise clear. No pleural effusions. IMPRESSION: Stable cardiomegaly.  Possible mild perihilar pulmonary edema. Electronically Signed   By: ThomEvangeline Dakin.   On: 07/26/2015 20:48   Ct Head Wo Contrast  07/26/2015  CLINICAL DATA:  Fall.  Trauma. EXAM: CT HEAD WITHOUT CONTRAST TECHNIQUE: Contiguous axial images were obtained from the base of the skull through the vertex without intravenous contrast. COMPARISON:  February 22, 2014 FINDINGS:  No subdural, epidural, or subarachnoid hemorrhage. Cerebellum, brainstem, and basal cisterns are normal. Moderate white matter changes are stable. Ventricles and sulci are unchanged. No acute cortical ischemia or infarct. Basal ganglia calcifications are noted. IMPRESSION: No acute intracranial process. Electronically Signed   By: Dorise Bullion III M.D   On: 07/26/2015 20:43   Ct Cervical Spine Wo Contrast  07/26/2015  CLINICAL DATA:  Pain after fall. EXAM: CT CERVICAL SPINE WITHOUT CONTRAST TECHNIQUE: Multidetector CT imaging of the cervical spine was performed without intravenous contrast. Multiplanar CT image reconstructions were also generated. COMPARISON:  None. FINDINGS: No fracture or traumatic malalignment. 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 Multilevel degenerative changes are noted. IMPRESSION: No fracture or traumatic malalignment.  Degenerative change. Electronically Signed   By: Dorise Bullion III M.D   On: 07/26/2015 20:53   Dg Hip Unilat With Pelvis 2-3 Views Left  07/26/2015  CLINICAL DATA:  82 year old nursing home patient who is unresponsive but was found to have a left hip fracture on a mobile x-ray examination earlier today. EXAM: DG HIP (WITH OR WITHOUT PELVIS) 2-3V LEFT COMPARISON:  None. The mobile x-ray is not available for  comparison. FINDINGS: Acute comminuted subcapital left femoral neck fracture. Hip joint intact with mild joint space narrowing. Included AP pelvis demonstrates no fractures elsewhere. Symmetric mild joint space narrowing in the contralateral right hip. Sacroiliac joints symphysis pubis intact. Degenerative changes involving the visualized lower lumbar spine. IMPRESSION: Acute comminuted subcapital left femoral neck fracture. Electronically Signed   By: Evangeline Dakin M.D.   On: 07/26/2015 20:47    Positive ROS: All other systems have been reviewed and were otherwise negative with the exception of those mentioned in the HPI and as above.  Objective: Labs cbc  Recent Labs  07/26/15 2001  WBC 14.1*  HGB 12.2  HCT 35.7*  PLT 226    Labs inflam No results for input(s): CRP in the last 72 hours.  Invalid input(s): ESR  Labs coag  Recent Labs  07/26/15 2001  INR 1.13     Recent Labs  07/26/15 2001  NA 135  K 3.5  CL 103  CO2 23  GLUCOSE 202*  BUN 12  CREATININE 0.73  CALCIUM 9.2    Physical Exam: Filed Vitals:   07/26/15 2210 07/27/15 0230  BP: 169/76 166/70  Pulse: 89 84  Temp: 97.4 F (36.3 C) 98.4 F (36.9 C)  Resp: 18 17   General: Alert, uncomfortable appearing intermittently in no acute distress.  [name] - eldest son at bedside. Cardiovascular: No pedal edema Respiratory: No cyanosis, no use of accessory musculature GI: No organomegaly, abdomen is soft and non-tender Skin: No lesions in the area of chief complaint.  Mild bruising. Neurologic: Does not respond to questions. Psychiatric: Not oriented / intermittently oriented to person at baseline.  MUSCULOSKELETAL:  Pain with palpation and movement of of left hip which is shortened and externally rotated.  Foot warm.  Son reports history of DM neuropathy. Other extremities are atraumatic.   Prudencio Burly III PA-C 07/27/2015 6:25 AM

## 2015-07-27 NOTE — Progress Notes (Signed)
Pharmacy Antibiotic Note  NIKINA HAYER is a 82 y.o. female admitted on 07/26/2015 with encephalopathy. Ortho involved due to closed L hip fracture. Antibiotics are being started empirically. SCr 0.7, eCrCl > 60, WBC 13, low grade fever, blood and urine cx drawn  Plan: -Zosyn 3.375 g IV q8h -Vancomycin 1500 mg IV x1 then 1g/24h -Monitor renal fx, cultures, duration of therapy     Temp (24hrs), Avg:98.7 F (37.1 C), Min:97.4 F (36.3 C), Max:100.9 F (38.3 C)   Recent Labs Lab 07/26/15 2001 07/27/15 0740  WBC 14.1* 13.0*  CREATININE 0.73 0.72    CrCl cannot be calculated (Unknown ideal weight.).    Allergies  Allergen Reactions  . Iron     GI  . Meloxicam Other (See Comments)    Abd pain  . Meperidine Hcl Other (See Comments)    agitated  . Pravastatin     myalgias  . Zolpidem Tartrate Other (See Comments)    confusion  . Codeine Palpitations    "heart raced"    Antimicrobials this admission: 6/28 vancomycin > 6/28 zosyn >  Dose adjustments this admission: NA  Microbiology results: 6/27 blood cx: 6/27 urine cx:   Thank you for allowing pharmacy to be a part of this patient's care.  Harvel Quale 07/27/2015 1:17 PM

## 2015-07-27 NOTE — ED Provider Notes (Signed)
CSN: [id number]     Arrival date & time 07/26/15  1904 History   First MD Initiated Contact with Patient 07/26/15 1908     Chief Complaint  Patient presents with  . Hip Injury     (Consider location/radiation/quality/duration/timing/severity/associated sxs/prior Treatment) HPI Comments: 82 year old female with a history of type 2 diabetes, hyperlipidemia, hypertension, pulmonary fibrosis, COPD, CK D, dementia presents from [facility] with left hip fracture.Reports that patient was in her normal state of health yesterday, however today was not active, and was complaining of pain when they moved her in bed. They obtained an x-ray of her left hip which showed a left femoral neck fracture, and she is transferred here for further care.  It is unknown if patient had a fall, and she would not be able to express this at baseline. She is normally ambulatory with her cane. Today had reported pain.  Yesterday she was in normal state of health, walking around the facility.  Niece reports she was more fatigued and less herself this AM, however now she is significant more sleepy. Pt received fentanyl en route.  Reports she is unsure if she follows commands at baseline, however reports she is only oriented to self.  Denies known nausea, vomiting, black or bloody stools, rash, urinary symptoms.  Unknown if pt had unwitnessed fall, however no hx from the facility regarding this.  Reports pt's family is packing up to return from vacation.   Level V Caveat, Dementia   Past Medical History  Diagnosis Date  . Asthma   . Type II or unspecified type diabetes mellitus without mention of complication, not stated as uncontrolled   . Hyperlipidemia   . Chronic headaches   . Hypertension   . Pulmonary fibrosis (HCC)     Chronic granulomatous changes on CT chest, minimal mediastinal LAN 3/09  . COPD (chronic obstructive pulmonary disease) ([place])   . Vitamin D deficiency   . B12 deficiency   . Allergy   . Dementia    . Depression   . Hypothyroidism   . CKD (chronic kidney disease) stage 3, GFR 30-59 ml/min    Past Surgical History  Procedure Laterality Date  . Appendectomy    . Thyroidectomy    . Tubal ligation    . Esophagogastroduodenoscopy  2012    Gastritis  . Breast biopsy      right breast/benign  . Endometrial biopsy  1998    negative   Family History  Problem Relation Age of Onset  . Asthma Sister   . Cancer Sister     breast, colon  . Diabetes Mother   . Cancer Mother     breast  . Diabetes Sister   . Cancer Sister 67    breast  . Heart disease Father   . Hypertension Father   . Cancer Brother     leukemia  . Cancer Brother     lung  . Hypertension Brother   . Hyperlipidemia Brother    Social History  Substance Use Topics  . Smoking status: Never Smoker   . Smokeless tobacco: Never Used  . Alcohol Use: No   OB History    No data available     Review of Systems  Unable to perform ROS: Mental status change      Allergies  Iron; Meloxicam; Meperidine hcl; Pravastatin; Zolpidem tartrate; and Codeine  Home Medications   Prior to Admission medications   Medication Sig Start Date End Date Taking? Authorizing Provider  clonazePAM (  KLONOPIN) 0.5 MG tablet Take 0.25 mg by mouth daily as needed for anxiety.   Yes Historical Provider, MD  clonazePAM (KLONOPIN) 1 MG tablet Take 1 mg by mouth at bedtime.   Yes Historical Provider, MD  Cranberry 425 MG CAPS Take 1 capsule by mouth daily.   Yes Historical Provider, MD  docusate sodium (COLACE) 100 MG capsule Take 100 mg by mouth daily.   Yes Historical Provider, MD  levothyroxine (SYNTHROID, LEVOTHROID) 25 MCG tablet Take 25 mcg by mouth daily before breakfast.   Yes Historical Provider, MD  loratadine (CLARITIN) 10 MG tablet Take 10 mg by mouth every morning.    Yes Historical Provider, MD  Melatonin 10 MG CAPS Take 1 capsule by mouth daily.   Yes Historical Provider, MD  sertraline (ZOLOFT) 100 MG tablet Take 100 mg  by mouth daily. Take along with the 25mg  tablet to make a total dose of 125mg  daily   Yes Historical Provider, MD  sertraline (ZOLOFT) 25 MG tablet Take 25 mg by mouth daily. Take along with the 100mg  tablet to make a total of 125mg  daily   Yes Historical Provider, MD  traZODone (DESYREL) 100 MG tablet Take 100 mg by mouth at bedtime.   Yes Historical Provider, MD  verapamil (CALAN-SR) 240 MG CR tablet take 1 tablet by mouth once daily for blood pressure 01/06/15  Yes Unk Pinto, MD  benazepril (LOTENSIN) 10 MG tablet Take 1 tablet (10 mg total) by mouth daily. Patient not taking: Reported on 07/26/2015 06/16/14   Starlyn Skeans, PA-C  citalopram (CELEXA) 20 MG tablet Take 1 tablet daily Patient not taking: Reported on 07/26/2015 09/23/14   Unk Pinto, MD  fluconazole (DIFLUCAN) 150 MG tablet Take 1 tablet 2 times a week, on Monday and Thursday, for 4 weeks. Patient not taking: Reported on 07/26/2015 03/10/15   Unk Pinto, MD  haloperidol (HALDOL) 5 MG tablet Take 1 tablet 1 x day with Supper (5pm)  and q4hr prn agitation. (Max 6 total doses/24 hr) Hold supper dose if patient sedate Patient not taking: Reported on 07/26/2015 03/02/15   Unk Pinto, MD  hydrochlorothiazide (HYDRODIURIL) 25 MG tablet Take 1 tablet (25 mg total) by mouth daily. Patient not taking: Reported on 07/26/2015 05/12/15   Unk Pinto, MD  levothyroxine (SYNTHROID, LEVOTHROID) 75 MCG tablet take 1 tablet by mouth once daily Patient not taking: Reported on 07/26/2015 07/27/14   Unk Pinto, MD  metFORMIN (GLUCOPHAGE-XR) 500 MG 24 hr tablet take 1 to 2 tablets by mouth twice a day after meals Patient not taking: Reported on 07/26/2015 03/31/14   Unk Pinto, MD  traZODone (DESYREL) 150 MG tablet Take 1/2   tablet daily at 5PM Patient not taking: Reported on 07/26/2015 03/02/15   Unk Pinto, MD   BP 169/76 mmHg  Pulse 89  Temp(Src) 97.4 F (36.3 C) (Oral)  Resp 18  SpO2 95% Physical Exam   Constitutional: She appears well-developed and well-nourished. She appears listless. No distress.  HENT:  Head: Normocephalic and atraumatic.  Eyes: Conjunctivae are normal. Pupils are equal, round, and reactive to light.  Unable to fully evaluate EOM given pt will not keep eyes open long enough to do so  Neck: Normal range of motion.  Cardiovascular: Normal rate, regular rhythm, normal heart sounds and intact distal pulses.  Exam reveals no gallop and no friction rub.   No murmur heard. Pulmonary/Chest: Effort normal and breath sounds normal. No respiratory distress. She has no wheezes. She has no rales. She  exhibits no tenderness.  Abdominal: Soft. She exhibits no distension. There is no tenderness. There is no guarding.  Musculoskeletal: She exhibits no edema or tenderness.       Right ankle: She exhibits normal pulse.       Left ankle: She exhibits normal pulse.  Left leg shortened and externally rotated  Neurological: She appears listless. GCS eye subscore is 2. GCS verbal subscore is 3. GCS motor subscore is 5.  Patient sleepy, not following commands, however when arms lifted will hold them equally, moves right leg and left toes to obnoxious stimuli, symmetric grimace  Skin: Skin is warm and dry. No rash noted. She is not diaphoretic. No erythema.  Nursing note and vitals reviewed.   ED Course  Procedures (including critical care time) Labs Review Labs Reviewed  CBC WITH DIFFERENTIAL/PLATELET - Abnormal; Notable for the following:    WBC 14.1 (*)    HCT 35.7 (*)    Neutro Abs 11.9 (*)    Monocytes Absolute 1.1 (*)    All other components within normal limits  COMPREHENSIVE METABOLIC PANEL - Abnormal; Notable for the following:    Glucose, Bld 202 (*)    All other components within normal limits  URINALYSIS, ROUTINE W REFLEX MICROSCOPIC (NOT AT [facility]) - Abnormal; Notable for the following:    Glucose, UA 500 (*)    Ketones, ur 15 (*)    Protein, ur 100 (*)    All other  components within normal limits  URINE MICROSCOPIC-ADD ON - Abnormal; Notable for the following:    Squamous Epithelial / LPF 0-5 (*)    Bacteria, UA RARE (*)    All other components within normal limits  GLUCOSE, CAPILLARY - Abnormal; Notable for the following:    Glucose-Capillary 192 (*)    All other components within normal limits  URINE CULTURE  PROTIME-INR  CBC WITH DIFFERENTIAL/PLATELET  COMPREHENSIVE METABOLIC PANEL  TYPE AND SCREEN    Imaging Review Dg Chest 1 View  07/26/2015  CLINICAL DATA:  Left femoral neck fracture. Preoperative respiratory evaluation. EXAM: CHEST 1 VIEW COMPARISON:  08/04/2014 and earlier. FINDINGS: AP supine examination was performed. Suboptimal inspiration. Cardiac silhouette mildly to moderately enlarged, unchanged. Pulmonary venous hypertension with perhaps mild perihilar pulmonary edema. Lungs otherwise clear. No pleural effusions. IMPRESSION: Stable cardiomegaly.  Possible mild perihilar pulmonary edema. Electronically Signed   By: Evangeline Dakin M.D.   On: 07/26/2015 20:48   Ct Head Wo Contrast  07/26/2015  CLINICAL DATA:  Fall.  Trauma. EXAM: CT HEAD WITHOUT CONTRAST TECHNIQUE: Contiguous axial images were obtained from the base of the skull through the vertex without intravenous contrast. COMPARISON:  February 22, 2014 FINDINGS: No subdural, epidural, or subarachnoid hemorrhage. Cerebellum, brainstem, and basal cisterns are normal. Moderate white matter changes are stable. Ventricles and sulci are unchanged. No acute cortical ischemia or infarct. Basal ganglia calcifications are noted. IMPRESSION: No acute intracranial process. Electronically Signed   By: Dorise Bullion III M.D   On: 07/26/2015 20:43   Ct Cervical Spine Wo Contrast  07/26/2015  CLINICAL DATA:  Pain after fall. EXAM: CT CERVICAL SPINE WITHOUT CONTRAST TECHNIQUE: Multidetector CT imaging of the cervical spine was performed without intravenous contrast. Multiplanar CT image  reconstructions were also generated. COMPARISON:  None. FINDINGS: No fracture or traumatic malalignment. 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 Multilevel degenerative changes are noted. IMPRESSION: No fracture or traumatic malalignment.  Degenerative change. Electronically Signed   By: Dorise Bullion III M.D  On: 07/26/2015 20:53   Dg Hip Unilat With Pelvis 2-3 Views Left  07/26/2015  CLINICAL DATA:  82 year old nursing home patient who is unresponsive but was found to have a left hip fracture on a mobile x-ray examination earlier today. EXAM: DG HIP (WITH OR WITHOUT PELVIS) 2-3V LEFT COMPARISON:  None. The mobile x-ray is not available for comparison. FINDINGS: Acute comminuted subcapital left femoral neck fracture. Hip joint intact with mild joint space narrowing. Included AP pelvis demonstrates no fractures elsewhere. Symmetric mild joint space narrowing in the contralateral right hip. Sacroiliac joints symphysis pubis intact. Degenerative changes involving the visualized lower lumbar spine. IMPRESSION: Acute comminuted subcapital left femoral neck fracture. Electronically Signed   By: Evangeline Dakin M.D.   On: 07/26/2015 20:47   I have personally reviewed and evaluated these images and lab results as part of my medical decision-making.   EKG Interpretation   Date/Time:  Tuesday July 26 2015 19:15:57 EDT Ventricular Rate:  94 PR Interval:    QRS Duration: 160 QT Interval:  431 QTC Calculation: 539 R Axis:   -71 Text Interpretation:  Sinus rhythm Borderline prolonged PR interval Right  atrial enlargement Left bundle branch block Left bundle branch is new  since ECG 02/22/2014 Confirmed by [facility] MD, Tawana Pasch ([id number]) on 07/27/2015  2:06:18 AM      MDM   Final diagnoses:  Closed left hip fracture, initial encounter [facility])  Delirium   82 year old female with a history of type 2 diabetes, hyperlipidemia, hypertension, pulmonary fibrosis, COPD, CK D, dementia presents from  [facility] with left hip fracture.Reports that patient was in her normal state of health yesterday, however today was not active, and was complaining of pain when they moved her in bed. They obtained an x-ray of her left hip which showed a left femoral neck fracture, and she is transferred here for further care.  It is unknown if patient had a fall, and she would not be able to express this at baseline. She is normally ambulatory with her cane.   Patient received fentanyl en route to the hospital, and is sleepy on my evaluation. While my exam is limited by her sedation, she appears to have a nonfocal neurologic exam and feel CVA is less likely etiology of symptoms and pain medications likely playing a role by hx.  Pt without infectious symptoms, however urinalysis pending. CBC shows leukocytosis which may be secondary to trauma/fracture however urine pending at this time. CXR not consistent with pneumonia. No new medications per niece.  CT head/CSpine within normal limits. Patient with increased O2 requirement, possibly secondary to pain medications, versus mild CHF by CXR.  Labs otherwise without significant abnormality, mild hypokalemia.  Pt with likely worsened delirium in setting of pain followed by pain medications on background of dementia.   XR here also confirms L femoral neck fx. Pt appears neurovascularly intact.  Consulted Orthopedics, Dr. Percell Miller, who reviewed the images, and will see patient in the AM and schedule surgery for Thursday.    Admitted to Dr. Olevia Bowens, hospitalist who will continue to care for the patient.    Gareth Morgan, MD 07/27/15 [phone]

## 2015-07-27 NOTE — Progress Notes (Addendum)
Initial Nutrition Assessment  DOCUMENTATION CODES:   Not applicable  INTERVENTION:  Provide Glucerna Shake po TID, each supplement provides 220 kcal and 10 grams of protein.  Encourage adequate PO intake.   Recommend obtaining new weight to fully assess weight trends.   NUTRITION DIAGNOSIS:   Increased nutrient needs related to chronic illness as evidenced by estimated needs.  GOAL:   Patient will meet greater than or equal to 90% of their needs  MONITOR:   PO intake, Supplement acceptance, Weight trends, Labs, I & O's  REASON FOR ASSESSMENT:   Consult Assessment of nutrition requirement/status  ASSESSMENT:   82 y.o. female with medical history significant of asthma, type 2 diabetes, hyperlipidemia, hypertension, chronic headaches, COPD, pulmonary fibrosis, B12 deficiency, vitamin D deficiency, depression, Alzheimer's dementia, hypothyroidism who is usually ambulatory at the nursing home facility where she lives and comes via EMS to the emergency department due to having a left hip fracture diagnosed by a mobile x-ray from presumed fall  Plans for surgery tomorrow. Pt was asleep during time of visit and did not wake. Daughter at bedside. Pt is from [facility]. She reports pt usually consumes 3 meals a day with an Ensure at least once daily. She reports pt does have some chewing difficulties at time thus has been mostly consuming soft foods, such as grits/oatmeal. Pt with no po over the past 2 days due to decreased appetite and pain. Diet has just been advanced to a regular diet. Noted no new weight recorded, thus pt was weighed on bed scale which revealed ~140 lbs. Daughter reports ~140 has been her more recent usual weight. Recommend obtaining new weight to fully assess weight trends however. Noted CBG's have been a bit elevated. RD to order Glucerna Shake to aid in caloric and protein needs.   Nutrition-Focused physical exam completed. Findings are no fat  depletion, severe muscle depletion, and mild edema.   Labs and medications reviewed. CBG's 171-200 mg/dL.  Diet Order:  Diet NPO time specified Except for: Sips with Meds Diet NPO time specified Except for: Sips with Meds Diet regular Room service appropriate?: Yes; Fluid consistency:: Thin  Skin:  Reviewed, no issues  Last BM:  Unknown  Height:   Ht Readings from Last 1 Encounters:  02/10/15 5\' 6"  (1.676 m)    Weight:   Wt Readings from Last 1 Encounters:  02/10/15 163 lb (73.936 kg)  07/27/15 140 lbs per bed scale (question accuracy?)  Ideal Body Weight:  59 kg  BMI:  There is no weight on file to calculate BMI.  Estimated Nutritional Needs:   Kcal:  1550-1750  Protein:  70-80 grams  Fluid:  1.5 - 1.7 L/day  EDUCATION NEEDS:   No education needs identified at this time  Corrin Parker, MS, RD, LDN Pager # [phone] After hours/ weekend pager # [phone]

## 2015-07-28 ENCOUNTER — Encounter (HOSPITAL_COMMUNITY): Payer: Self-pay | Admitting: Certified Registered Nurse Anesthetist

## 2015-07-28 ENCOUNTER — Encounter (HOSPITAL_COMMUNITY)
Admission: EM | Disposition: A | Discharge status: Skilled Nursing Facility | Payer: Self-pay | Source: Home / Self Care | Attending: Internal Medicine

## 2015-07-28 ENCOUNTER — Inpatient Hospital Stay (HOSPITAL_COMMUNITY): Payer: Medicare Other

## 2015-07-28 ENCOUNTER — Other Ambulatory Visit: Payer: Self-pay | Admitting: Internal Medicine

## 2015-07-28 ENCOUNTER — Inpatient Hospital Stay (HOSPITAL_COMMUNITY): Payer: Medicare Other | Admitting: Anesthesiology

## 2015-07-28 DIAGNOSIS — E039 Hypothyroidism, unspecified: Secondary | ICD-10-CM

## 2015-07-28 DIAGNOSIS — I10 Essential (primary) hypertension: Secondary | ICD-10-CM

## 2015-07-28 HISTORY — PX: ANTERIOR APPROACH HEMI HIP ARTHROPLASTY: SHX6690

## 2015-07-28 LAB — CBC WITH DIFFERENTIAL/PLATELET
BASOS ABS: 0 10*3/uL (ref 0.0–0.1)
BASOS PCT: 0 %
EOS PCT: 5 %
Eosinophils Absolute: 0.4 10*3/uL (ref 0.0–0.7)
HEMATOCRIT: 32.1 % — AB (ref 36.0–46.0)
Hemoglobin: 10.3 g/dL — ABNORMAL LOW (ref 12.0–15.0)
LYMPHS PCT: 7 %
Lymphs Abs: 0.6 10*3/uL — ABNORMAL LOW (ref 0.7–4.0)
MCH: 28.9 pg (ref 26.0–34.0)
MCHC: 32.1 g/dL (ref 30.0–36.0)
MCV: 89.9 fL (ref 78.0–100.0)
MONO ABS: 0.8 10*3/uL (ref 0.1–1.0)
MONOS PCT: 10 %
Neutro Abs: 6.5 10*3/uL (ref 1.7–7.7)
Neutrophils Relative %: 78 %
PLATELETS: 194 10*3/uL (ref 150–400)
RBC: 3.57 MIL/uL — ABNORMAL LOW (ref 3.87–5.11)
RDW: 13.2 % (ref 11.5–15.5)
WBC: 8.4 10*3/uL (ref 4.0–10.5)

## 2015-07-28 LAB — COMPREHENSIVE METABOLIC PANEL
ALT: 53 U/L (ref 14–54)
ANION GAP: 9 (ref 5–15)
AST: 54 U/L — AB (ref 15–41)
Albumin: 2.7 g/dL — ABNORMAL LOW (ref 3.5–5.0)
Alkaline Phosphatase: 89 U/L (ref 38–126)
BILIRUBIN TOTAL: 0.8 mg/dL (ref 0.3–1.2)
BUN: 23 mg/dL — AB (ref 6–20)
CHLORIDE: 104 mmol/L (ref 101–111)
CO2: 23 mmol/L (ref 22–32)
Calcium: 8.5 mg/dL — ABNORMAL LOW (ref 8.9–10.3)
Creatinine, Ser: 0.85 mg/dL (ref 0.44–1.00)
Glucose, Bld: 124 mg/dL — ABNORMAL HIGH (ref 65–99)
POTASSIUM: 3.3 mmol/L — AB (ref 3.5–5.1)
Sodium: 136 mmol/L (ref 135–145)
TOTAL PROTEIN: 5.4 g/dL — AB (ref 6.5–8.1)

## 2015-07-28 LAB — URINE CULTURE: Culture: NO GROWTH

## 2015-07-28 LAB — GLUCOSE, CAPILLARY
GLUCOSE-CAPILLARY: 123 mg/dL — AB (ref 65–99)
GLUCOSE-CAPILLARY: 125 mg/dL — AB (ref 65–99)
GLUCOSE-CAPILLARY: 102 mg/dL — AB (ref 65–99)
Glucose-Capillary: 223 mg/dL — ABNORMAL HIGH (ref 65–99)

## 2015-07-28 SURGERY — HEMIARTHROPLASTY, HIP, DIRECT ANTERIOR APPROACH, FOR FRACTURE
Anesthesia: Spinal | Site: Hip | Laterality: Left

## 2015-07-28 MED ORDER — PHENYLEPHRINE HCL 10 MG/ML IJ SOLN
10.0000 mg | INTRAVENOUS | Status: DC | PRN
  Administered 2015-07-28: 15 ug/min via INTRAVENOUS

## 2015-07-28 MED ORDER — LIDOCAINE 2% (20 MG/ML) 5 ML SYRINGE
INTRAMUSCULAR | Status: DC | PRN
  Administered 2015-07-28: 40 mg via INTRAVENOUS

## 2015-07-28 MED ORDER — ACETAMINOPHEN 325 MG PO TABS
650.0000 mg | ORAL_TABLET | Freq: Four times a day (QID) | ORAL | Status: DC | PRN
  Administered 2015-07-29 – 2015-07-31 (×3): 650 mg via ORAL
  Filled 2015-07-28 (×7): qty 2

## 2015-07-28 MED ORDER — BUPIVACAINE HCL (PF) 0.25 % IJ SOLN
INTRAMUSCULAR | Status: AC
  Filled 2015-07-28: qty 30

## 2015-07-28 MED ORDER — CHLORHEXIDINE GLUCONATE 4 % EX LIQD
60.0000 mL | Freq: Once | CUTANEOUS | Status: DC
  Filled 2015-07-28 (×2): qty 60

## 2015-07-28 MED ORDER — METOCLOPRAMIDE HCL 5 MG PO TABS: 5.0000 mg | ORAL_TABLET | Freq: Three times a day (TID) | ORAL | Status: DC | PRN

## 2015-07-28 MED ORDER — DEXAMETHASONE SODIUM PHOSPHATE 10 MG/ML IJ SOLN
10.0000 mg | Freq: Once | INTRAMUSCULAR | Status: AC
  Administered 2015-07-29: 10 mg via INTRAVENOUS
  Filled 2015-07-28: qty 1

## 2015-07-28 MED ORDER — CEFAZOLIN SODIUM-DEXTROSE 2-4 GM/100ML-% IV SOLN
INTRAVENOUS | Status: AC
Start: 2015-07-28 — End: 2015-07-28
  Administered 2015-07-28: 2 g via INTRAVENOUS
  Filled 2015-07-28: qty 100

## 2015-07-28 MED ORDER — BUPIVACAINE HCL 0.25 % IJ SOLN
INTRAMUSCULAR | Status: DC | PRN
  Administered 2015-07-28: 30 mL via INTRA_ARTICULAR

## 2015-07-28 MED ORDER — PROPOFOL 500 MG/50ML IV EMUL
INTRAVENOUS | Status: DC | PRN
Start: 2015-07-28 — End: 2015-07-28
  Administered 2015-07-28: 50 ug/kg/min via INTRAVENOUS

## 2015-07-28 MED ORDER — OMEPRAZOLE 20 MG PO CPDR: 20.0000 mg | DELAYED_RELEASE_CAPSULE | Freq: Every day | ORAL | Status: AC

## 2015-07-28 MED ORDER — LACTATED RINGERS IV SOLN
INTRAVENOUS | Status: DC
  Administered 2015-07-28: 10:00:00 via INTRAVENOUS

## 2015-07-28 MED ORDER — FENTANYL CITRATE (PF) 100 MCG/2ML IJ SOLN
INTRAMUSCULAR | Status: DC | PRN
Start: 2015-07-28 — End: 2015-07-28
  Administered 2015-07-28: 25 ug via INTRAVENOUS

## 2015-07-28 MED ORDER — ACETAMINOPHEN 500 MG PO TABS
1000.0000 mg | ORAL_TABLET | Freq: Once | ORAL | Status: DC
  Filled 2015-07-28: qty 2

## 2015-07-28 MED ORDER — POVIDONE-IODINE 10 % EX SWAB
2.0000 "application " | Freq: Once | CUTANEOUS | Status: AC
  Administered 2015-07-28: 2 via TOPICAL

## 2015-07-28 MED ORDER — LACTATED RINGERS IV SOLN
INTRAVENOUS | Status: DC | PRN
  Administered 2015-07-28 (×2): via INTRAVENOUS

## 2015-07-28 MED ORDER — BUPIVACAINE HCL (PF) 0.5 % IJ SOLN
INTRAMUSCULAR | Status: DC | PRN
Start: 2015-07-28 — End: 2015-07-28
  Administered 2015-07-28: 3 mL via INTRATHECAL

## 2015-07-28 MED ORDER — 0.9 % SODIUM CHLORIDE (POUR BTL) OPTIME
TOPICAL | Status: DC | PRN
  Administered 2015-07-28: 1000 mL

## 2015-07-28 MED ORDER — KETOROLAC TROMETHAMINE 30 MG/ML IJ SOLN
INTRAMUSCULAR | Status: DC | PRN
  Administered 2015-07-28: 30 mg via INTRA_ARTICULAR

## 2015-07-28 MED ORDER — LORAZEPAM 2 MG/ML IJ SOLN
0.5000 mg | Freq: Four times a day (QID) | INTRAMUSCULAR | Status: DC | PRN
  Administered 2015-07-28: 0.5 mg via INTRAVENOUS
  Filled 2015-07-28: qty 1

## 2015-07-28 MED ORDER — LACTATED RINGERS IV SOLN
INTRAVENOUS | Status: DC
  Administered 2015-07-28: 16:00:00 via INTRAVENOUS

## 2015-07-28 MED ORDER — FENTANYL CITRATE (PF) 250 MCG/5ML IJ SOLN
INTRAMUSCULAR | Status: AC
  Filled 2015-07-28: qty 5

## 2015-07-28 MED ORDER — FENTANYL CITRATE (PF) 100 MCG/2ML IJ SOLN
25.0000 ug | INTRAMUSCULAR | Status: DC | PRN
Start: 2015-07-28 — End: 2015-07-28

## 2015-07-28 MED ORDER — METOCLOPRAMIDE HCL 5 MG/ML IJ SOLN: 10.0000 mg | Freq: Once | INTRAMUSCULAR | Status: DC | PRN

## 2015-07-28 MED ORDER — ASPIRIN EC 325 MG PO TBEC: 325.0000 mg | DELAYED_RELEASE_TABLET | Freq: Every day | ORAL | Status: DC

## 2015-07-28 MED ORDER — EPHEDRINE SULFATE-NACL 50-0.9 MG/10ML-% IV SOSY
PREFILLED_SYRINGE | INTRAVENOUS | Status: DC | PRN
  Administered 2015-07-28: 5 mg via INTRAVENOUS
  Administered 2015-07-28 (×2): 2.5 mg via INTRAVENOUS

## 2015-07-28 MED ORDER — METOCLOPRAMIDE HCL 5 MG/ML IJ SOLN: 5.0000 mg | Freq: Three times a day (TID) | INTRAMUSCULAR | Status: DC | PRN

## 2015-07-28 MED ORDER — DOCUSATE SODIUM 100 MG PO CAPS
100.0000 mg | ORAL_CAPSULE | Freq: Two times a day (BID) | ORAL | Status: DC
  Administered 2015-07-28 – 2015-07-29 (×2): 100 mg via ORAL
  Filled 2015-07-28 (×3): qty 1

## 2015-07-28 MED ORDER — PROPOFOL 10 MG/ML IV BOLUS
INTRAVENOUS | Status: AC
  Filled 2015-07-28: qty 20

## 2015-07-28 MED ORDER — KETOROLAC TROMETHAMINE 30 MG/ML IJ SOLN
INTRAMUSCULAR | Status: AC
  Filled 2015-07-28: qty 1

## 2015-07-28 MED ORDER — ACETAMINOPHEN 650 MG RE SUPP
650.0000 mg | Freq: Four times a day (QID) | RECTAL | Status: DC | PRN
  Filled 2015-07-28: qty 1

## 2015-07-28 MED ORDER — BISACODYL 10 MG RE SUPP: 10.0000 mg | Freq: Every day | RECTAL | Status: DC | PRN

## 2015-07-28 SURGICAL SUPPLY — 62 items
BIT DRILL 7/64X5 DISP (BIT) ×1 IMPLANT
BLADE SAGITTAL 25.0X1.27X90 (BLADE) ×3 IMPLANT
BLADE SAGITTAL 25.0X1.27X90MM (BLADE) ×1
CAPT HIP HEMI 2 ×4 IMPLANT
CLOSURE STERI-STRIP 1/2X4 (GAUZE/BANDAGES/DRESSINGS) ×2
CLOSURE WOUND 1/2 X4 (GAUZE/BANDAGES/DRESSINGS) ×1
CLSR STERI-STRIP ANTIMIC 1/2X4 (GAUZE/BANDAGES/DRESSINGS) ×6 IMPLANT
COVER SURGICAL LIGHT HANDLE (MISCELLANEOUS) ×4 IMPLANT
DRAPE C-ARM 42X72 X-RAY (DRAPES) ×3 IMPLANT
DRAPE IMP U-DRAPE 54X76 (DRAPES) ×4 IMPLANT
DRAPE ORTHO SPLIT 77X108 STRL (DRAPES)
DRAPE STERI IOBAN 125X83 (DRAPES) ×3 IMPLANT
DRAPE SURG ORHT 6 SPLT 77X108 (DRAPES) ×2 IMPLANT
DRAPE U-SHAPE 47X51 STRL (DRAPES) ×10 IMPLANT
DRSG MEPILEX BORDER 4X8 (GAUZE/BANDAGES/DRESSINGS) ×4 IMPLANT
DURAPREP 26ML APPLICATOR (WOUND CARE) ×4 IMPLANT
ELECT BLADE 4.0 EZ CLEAN MEGAD (MISCELLANEOUS) ×4
ELECT CAUTERY BLADE 6.4 (BLADE) ×4 IMPLANT
ELECT REM PT RETURN 9FT ADLT (ELECTROSURGICAL) ×4
ELECTRODE BLDE 4.0 EZ CLN MEGD (MISCELLANEOUS) ×2 IMPLANT
ELECTRODE REM PT RTRN 9FT ADLT (ELECTROSURGICAL) ×2 IMPLANT
FACESHIELD WRAPAROUND (MASK) ×4 IMPLANT
FACESHIELD WRAPAROUND OR TEAM (MASK) ×1 IMPLANT
GLOVE BIO SURGEON STRL SZ 6.5 (GLOVE) ×4 IMPLANT
GLOVE BIO SURGEON STRL SZ7 (GLOVE) ×4 IMPLANT
GLOVE BIO SURGEON STRL SZ7.5 (GLOVE) ×3 IMPLANT
GLOVE BIO SURGEONS STRL SZ 6.5 (GLOVE) ×2
GLOVE BIOGEL PI IND STRL 6.5 (GLOVE) ×2 IMPLANT
GLOVE BIOGEL PI IND STRL 7.0 (GLOVE) ×2 IMPLANT
GLOVE BIOGEL PI IND STRL 8 (GLOVE) ×2 IMPLANT
GLOVE BIOGEL PI INDICATOR 6.5 (GLOVE) ×4
GLOVE BIOGEL PI INDICATOR 7.0 (GLOVE) ×2
GLOVE BIOGEL PI INDICATOR 8 (GLOVE) ×2
GOWN STRL REUS W/ TWL LRG LVL3 (GOWN DISPOSABLE) ×3 IMPLANT
GOWN STRL REUS W/ TWL XL LVL3 (GOWN DISPOSABLE) ×4 IMPLANT
GOWN STRL REUS W/TWL LRG LVL3 (GOWN DISPOSABLE) ×8
GOWN STRL REUS W/TWL XL LVL3 (GOWN DISPOSABLE) ×8
HIP CAPITATED HEMI 2 ×1 IMPLANT
KIT BASIN OR (CUSTOM PROCEDURE TRAY) ×4 IMPLANT
KIT ROOM TURNOVER OR (KITS) ×4 IMPLANT
MANIFOLD NEPTUNE II (INSTRUMENTS) ×4 IMPLANT
NDL 18GX1X1/2 (RX/OR ONLY) (NEEDLE) IMPLANT
NEEDLE 18GX1X1/2 (RX/OR ONLY) (NEEDLE) ×4 IMPLANT
NEEDLE 22X1 1/2 (OR ONLY) (NEEDLE) ×3 IMPLANT
NS IRRIG 1000ML POUR BTL (IV SOLUTION) ×4 IMPLANT
PACK TOTAL JOINT (CUSTOM PROCEDURE TRAY) ×4 IMPLANT
PACK UNIVERSAL I (CUSTOM PROCEDURE TRAY) ×4 IMPLANT
PAD ARMBOARD 7.5X6 YLW CONV (MISCELLANEOUS) ×8 IMPLANT
PILLOW ABDUCTION HIP (SOFTGOODS) ×4 IMPLANT
RETRIEVER SUT HEWSON (MISCELLANEOUS) ×1 IMPLANT
STRIP CLOSURE SKIN 1/2X4 (GAUZE/BANDAGES/DRESSINGS) ×2 IMPLANT
SUT FIBERWIRE #2 38 REV NDL BL (SUTURE) ×8
SUT MNCRL AB 4-0 PS2 18 (SUTURE) ×4 IMPLANT
SUT MON AB 2-0 CT1 36 (SUTURE) ×4 IMPLANT
SUT VIC AB 1 CT1 27 (SUTURE) ×4
SUT VIC AB 1 CT1 27XBRD ANBCTR (SUTURE) ×2 IMPLANT
SUTURE FIBERWR#2 38 REV NDL BL (SUTURE) ×4 IMPLANT
SYR 5ML LL (SYRINGE) ×3 IMPLANT
SYRINGE 60CC LL (MISCELLANEOUS) ×3 IMPLANT
TOWEL OR 17X24 6PK STRL BLUE (TOWEL DISPOSABLE) ×4 IMPLANT
TOWEL OR 17X26 10 PK STRL BLUE (TOWEL DISPOSABLE) ×4 IMPLANT
YANKAUER SUCT BULB TIP NO VENT (SUCTIONS) ×3 IMPLANT

## 2015-07-28 NOTE — Progress Notes (Signed)
PROGRESS NOTE  [name] [name] [id number] DOB: 06-17-1929 DOA: 07/26/2015 PCP: Alesia Richards, MD   LOS: 2 days   Brief Narrative: 82 y.o. female with medical history significant of asthma, type 2 diabetes, hyperlipidemia, hypertension, chronic headaches, COPD, pulmonary fibrosis, B12 deficiency, vitamin D deficiency, depression, Alzheimer's dementia, hypothyroidism who is usually ambulatory at the nursing home facility where she lives and comes via EMS to the emergency department due to having a left hip fracture  Assessment & Plan: Principal Problem:   Closed left hip fracture ([place]) Active Problems:   Hypothyroidism   Essential hypertension   Asthma   GERD   Dementia  Acute encephalopathy / febrile episode 6/28 - Patient with progressive lethargy 6/28, rapid response called, had a febrile episode and antibiotics were started - cultures obtained, negative - continue Zosyn today, stop vancomycin as MRSA negative - mental status improved today   Closed left hip fracture [facility]) - Orthopedic surgery consulted, appreciate input. The plan is to tentatively to do hip repair tomorrow pending cardiology clearance  LBBB  - 2D echo done 6/28, normal EF, no WMA, grade 1 DD - cardiology evaluated pre-op  Hypothyroidism - Resume levothyroxine tomorrow  Essential hypertension - Resume antihypertensives tomorrow  Asthma - As symptomatic at this time. - Bronchodilators as needed.  GERD  Dementia - Supportive care. - Resume psychotropics medications after surgery.   DVT prophylaxis: heparin Code Status: DNR Family Communication: d/w son bedside Disposition Plan: TBD  Consultants:   Orthopedic surgery  Cardiology  Procedures:   2D echo  Foley  Antimicrobials:  Zosyn 6/28 >>  Vancomycin 6/28 >> 6/29    Subjective: - keeping her eyes closed but nods to questions. Interacts well with her son - denies pain  Objective: Filed Vitals:   07/27/15 2030  07/28/15 0009 07/28/15 0621 07/28/15 0900  BP: 143/62  103/55 117/69  Pulse: 90 91 86 86  Temp: 98.3 F (36.8 C)  98.2 F (36.8 C) 99.1 F (37.3 C)  TempSrc: Axillary  Axillary Oral  Resp:    16  SpO2: 94% 94% 94% 95%    Intake/Output Summary (Last 24 hours) at 07/28/15 1300 Last data filed at 07/28/15 1245  Gross per 24 hour  Intake   1000 ml  Output    550 ml  Net    450 ml   There were no vitals filed for this visit.  Examination: Constitutional: NAD, eyes closed but interactive and answering questions Filed Vitals:   07/27/15 2030 07/28/15 0009 07/28/15 0621 07/28/15 0900  BP: 143/62  103/55 117/69  Pulse: 90 91 86 86  Temp: 98.3 F (36.8 C)  98.2 F (36.8 C) 99.1 F (37.3 C)  TempSrc: Axillary  Axillary Oral  Resp:    16  SpO2: 94% 94% 94% 95%   Eyes: PERRL Respiratory:  No crackles, no wheezing. [place] respiratory effort. No accessory muscle use.  Cardiovascular: Regular rate and rhythm, no murmurs / rubs / gallops. Trace LE edema. 2+ pedal pulses. Abdomen: no tenderness. Bowel sounds positive.  Musculoskeletal: no clubbing / cyanosis.  Skin: no rashes, lesions, ulcers. No induration Neurologic: non focal    Data Reviewed: I have personally reviewed following labs and imaging studies  CBC:  Recent Labs Lab 07/26/15 2001 07/27/15 0740 07/27/15 1322 07/28/15 0356  WBC 14.1* 13.0* 12.0* 8.4  NEUTROABS 11.9* 11.4* 10.4* 6.5  HGB 12.2 11.5* 12.2 10.3*  HCT 35.7* 34.1* 36.8 32.1*  MCV 87.3 86.3 89.1 89.9  PLT 226 196 209  [id number]   Basic Metabolic Panel:  Recent Labs Lab 07/26/15 2001 07/27/15 0740 07/27/15 1322 07/28/15 0356  NA 135 137 137 136  K 3.5 3.4* 3.3* 3.3*  CL 103 106 103 104  CO2 23 23 26 23   GLUCOSE 202* 174* 163* 124*  BUN 12 16 15  23*  CREATININE 0.73 0.72 0.76 0.85  CALCIUM 9.2 9.0 8.9 8.5*   GFR: CrCl cannot be calculated (Unknown ideal weight.). Liver Function Tests:  Recent Labs Lab 07/26/15 2001 07/27/15 0740  07/27/15 1322 07/28/15 0356  AST 38 33 33 54*  ALT 34 31 32 53  ALKPHOS 95 87 105 89  BILITOT 0.9 0.7 0.9 0.8  PROT 6.7 6.0* 6.5 5.4*  ALBUMIN 3.6 3.2* 3.3* 2.7*   No results for input(s): LIPASE, AMYLASE in the last 168 hours. No results for input(s): AMMONIA in the last 168 hours. Coagulation Profile:  Recent Labs Lab 07/26/15 2001 07/27/15 1322  INR 1.13 1.06   Cardiac Enzymes: No results for input(s): CKTOTAL, CKMB, CKMBINDEX, TROPONINI in the last 168 hours. BNP (last 3 results) No results for input(s): PROBNP in the last 8760 hours. HbA1C: No results for input(s): HGBA1C in the last 72 hours. CBG:  Recent Labs Lab 07/27/15 1149 07/27/15 1648 07/27/15 2203 07/28/15 0613 07/28/15 0920  GLUCAP 200* 174* 158* 123* 125*   Lipid Profile: No results for input(s): CHOL, HDL, LDLCALC, TRIG, CHOLHDL, LDLDIRECT in the last 72 hours. Thyroid Function Tests:  Recent Labs  07/27/15 1322  TSH 2.624   Anemia Panel: No results for input(s): VITAMINB12, FOLATE, FERRITIN, TIBC, IRON, RETICCTPCT in the last 72 hours. Urine analysis:    Component Value Date/Time   COLORURINE YELLOW 07/26/2015 2114   APPEARANCEUR CLEAR 07/26/2015 2114   LABSPEC 1.018 07/26/2015 2114   PHURINE 7.5 07/26/2015 2114   GLUCOSEU 500* 07/26/2015 2114   HGBUR NEGATIVE 07/26/2015 2114   BILIRUBINUR NEGATIVE 07/26/2015 2114   KETONESUR 15* 07/26/2015 2114   PROTEINUR 100* 07/26/2015 2114   UROBILINOGEN 0.2 08/04/2014 1544   NITRITE NEGATIVE 07/26/2015 2114   LEUKOCYTESUR NEGATIVE 07/26/2015 2114   Sepsis Labs: Invalid input(s): PROCALCITONIN, LACTICIDVEN  Recent Results (from the past 240 hour(s))  Urine culture     Status: None   Collection Time: 07/26/15  9:14 PM  Result Value Ref Range Status   Specimen Description URINE, CATHETERIZED  Final   Special Requests NONE  Final   Culture NO GROWTH  Final   Report Status 07/28/2015 FINAL  Final  Culture, blood (x 2)     Status: None  (Preliminary result)   Collection Time: 07/27/15  1:30 PM  Result Value Ref Range Status   Specimen Description BLOOD RIGHT ANTECUBITAL  Final   Special Requests BOTTLES DRAWN AEROBIC ONLY 5ML  Final   Culture NO GROWTH < 24 HOURS  Final   Report Status PENDING  Incomplete  Culture, blood (x 2)     Status: None (Preliminary result)   Collection Time: 07/27/15  1:38 PM  Result Value Ref Range Status   Specimen Description BLOOD RIGHT HAND  Final   Special Requests IN PEDIATRIC BOTTLE 2.5ML  Final   Culture NO GROWTH < 24 HOURS  Final   Report Status PENDING  Incomplete  Surgical pcr screen     Status: None   Collection Time: 07/27/15  8:33 PM  Result Value Ref Range Status   MRSA, PCR NEGATIVE NEGATIVE Final   Staphylococcus aureus NEGATIVE NEGATIVE Final    Comment:  The Xpert SA Assay (FDA approved for NASAL specimens in patients over 27 years of age), is one component of a comprehensive surveillance program.  Test performance has been validated by [facility] for patients greater than or equal to 50 year old. It is not intended to diagnose infection nor to guide or monitor treatment.       Radiology Studies: Dg Chest 1 View  07/26/2015  CLINICAL DATA:  Left femoral neck fracture. Preoperative respiratory evaluation. EXAM: CHEST 1 VIEW COMPARISON:  08/04/2014 and earlier. FINDINGS: AP supine examination was performed. Suboptimal inspiration. Cardiac silhouette mildly to moderately enlarged, unchanged. Pulmonary venous hypertension with perhaps mild perihilar pulmonary edema. Lungs otherwise clear. No pleural effusions. IMPRESSION: Stable cardiomegaly.  Possible mild perihilar pulmonary edema. Electronically Signed   By: Evangeline Dakin M.D.   On: 07/26/2015 20:48   Ct Head Wo Contrast  07/26/2015  CLINICAL DATA:  Fall.  Trauma. EXAM: CT HEAD WITHOUT CONTRAST TECHNIQUE: Contiguous axial images were obtained from the base of the skull through the vertex without  intravenous contrast. COMPARISON:  February 22, 2014 FINDINGS: No subdural, epidural, or subarachnoid hemorrhage. Cerebellum, brainstem, and basal cisterns are normal. Moderate white matter changes are stable. Ventricles and sulci are unchanged. No acute cortical ischemia or infarct. Basal ganglia calcifications are noted. IMPRESSION: No acute intracranial process. Electronically Signed   By: Dorise Bullion III M.D   On: 07/26/2015 20:43   Ct Cervical Spine Wo Contrast  07/26/2015  CLINICAL DATA:  Pain after fall. EXAM: CT CERVICAL SPINE WITHOUT CONTRAST TECHNIQUE: Multidetector CT imaging of the cervical spine was performed without intravenous contrast. Multiplanar CT image reconstructions were also generated. COMPARISON:  None. FINDINGS: No fracture or traumatic malalignment. 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 Multilevel degenerative changes are noted. IMPRESSION: No fracture or traumatic malalignment.  Degenerative change. Electronically Signed   By: Dorise Bullion III M.D   On: 07/26/2015 20:53   Dg Chest Port 1 View  07/27/2015  CLINICAL DATA:  Sepsis EXAM: PORTABLE CHEST 1 VIEW COMPARISON:  08/18/2015 FINDINGS: Chronic cardiomegaly and main pulmonary artery enlargement. Interstitial coarsening that is similar to prior, without effusion or Kerley line. No convincing consolidation, limited behind the heart on this portable study. Symmetric biapical pleural thickening. IMPRESSION: No convincing pneumonia. Cardiomegaly and bronchitic or congestive interstitial coarsening. Electronically Signed   By: Monte Fantasia M.D.   On: 07/27/2015 13:59   Dg Hip Unilat With Pelvis 2-3 Views Left  07/26/2015  CLINICAL DATA:  82 year old nursing home patient who is unresponsive but was found to have a left hip fracture on a mobile x-ray examination earlier today. EXAM: DG HIP (WITH OR WITHOUT PELVIS) 2-3V LEFT COMPARISON:  None. The mobile x-ray is not available for comparison. FINDINGS: Acute  comminuted subcapital left femoral neck fracture. Hip joint intact with mild joint space narrowing. Included AP pelvis demonstrates no fractures elsewhere. Symmetric mild joint space narrowing in the contralateral right hip. Sacroiliac joints symphysis pubis intact. Degenerative changes involving the visualized lower lumbar spine. IMPRESSION: Acute comminuted subcapital left femoral neck fracture. Electronically Signed   By: Evangeline Dakin M.D.   On: 07/26/2015 20:47     Scheduled Meds: . acetaminophen  1,000 mg Oral Once  . chlorhexidine  60 mL Topical Once  . [MAR Hold] feeding supplement (GLUCERNA SHAKE)  237 mL Oral TID BM  . [MAR Hold] heparin  5,000 Units Subcutaneous Q8H  . [MAR Hold] insulin aspart  0-5 Units Subcutaneous QHS  . [facility]  Hold] insulin aspart  0-9 Units Subcutaneous TID WC  . [MAR Hold] piperacillin-tazobactam (ZOSYN)  IV  3.375 g Intravenous Q8H  . [MAR Hold] sodium chloride flush  3 mL Intravenous Q12H  . [MAR Hold] vancomycin  1,000 mg Intravenous Q24H   Continuous Infusions: . lactated ringers 20 mL/hr at 07/28/15 [place], MD, PhD [facility] Pager [phone]  If 7PM-7AM, please contact night-coverage [email or web address] Password [email or web address] 07/28/2015, 1:00 PM

## 2015-07-28 NOTE — Op Note (Signed)
07/26/2015 - 07/28/2015  12:30 PM  PATIENT:  [name] [name]   MRN: [id number]  PRE-OPERATIVE DIAGNOSIS:  Left hip fracture  POST-OPERATIVE DIAGNOSIS:  Left hip fracture  PROCEDURE:  Procedure(s): ANTERIOR APPROACH HEMI HIP ARTHROPLASTY  PREOPERATIVE INDICATIONS:    [name] [name] is an 82 y.o. female who has a diagnosis of Closed left hip fracture ([place]) and elected for surgical management after failing conservative treatment.  The risks benefits and alternatives were discussed with the patient including but not limited to the risks of nonoperative treatment, versus surgical intervention including infection, bleeding, nerve injury, periprosthetic fracture, the need for revision surgery, dislocation, leg length discrepancy, blood clots, cardiopulmonary complications, morbidity, mortality, among others, and they were willing to proceed.     OPERATIVE REPORT     SURGEON:   Makenzy Krist, Ernesta Amble, MD    ASSISTANT:  Roxan Hockey, PA-C, She was present and scrubbed throughout the case, critical for completion in a timely fashion, and for retraction, instrumentation, and closure.     ANESTHESIA:  General    COMPLICATIONS:  None.     COMPONENTS:  [employer] acolade fit femur size 4, 49+0 head    PROCEDURE IN DETAIL:   The patient was met in the holding area and  identified.  The appropriate hip was identified and marked at the operative site.  The patient was then transported to the OR  and  placed under general anesthesia.  At that point, the patient was  placed in the supine position and  secured to the operating room table and all bony prominences padded. He received pre-operative antibiotics    The operative lower extremity was prepped from the iliac crest to the distal leg.  Sterile draping was performed.  Time out was performed prior to incision.      Skin incision was made just 2 cm lateral to the ASIS  extending in line with the tensor fascia lata. Electrocautery was used to control all  bleeders. I dissected down sharply to the fascia of the tensor fascia lata was confirmed that the muscle fibers beneath were running posteriorly. I then incised the fascia over the superficial tensor fascia lata in line with the incision. The fascia was elevated off the anterior aspect of the muscle the muscle was retracted posteriorly and protected throughout the case. I then used electrocautery to incise the tensor fascia lata fascia control and all bleeders. Immediately visible was the fat over top of the anterior neck and capsule.  I removed the anterior fat from the capsule and elevated the rectus muscle off of the anterior capsule. I then removed a large time of capsule. The retractors were then placed over the anterior acetabulum as well as around the superior and inferior neck.  I then removed a section of the femoral neck and a napkin ring fashion. Then used the power course to remove the femoral head from the acetabulum and thoroughly irrigated the acetabulum. I sized the femoral head.    I then abducted the leg and released the external rotators from the posterior femur allowing it to be easily delivered up lateral and anterior to the acetabulum for preparation of the femoral canal.    I then prepared the proximal femur using the cookie-cutter and then sequentially reamed and broached.  A trial broach, neck, and head was utilized, and I reduced the hip and it was found to have excellent stability with functional range of motion..  I then impacted the real femoral prosthesis into place  into the appropriate version, slightly anteverted to the normal anatomy, and I impacted the real head ball into place. The hip was then reduced and taken through functional range of motion and found to have excellent stability. Leg lengths were restored.  I then irrigated the hip copiously again with, and repaired the fascia with Vicryl, followed by monocryl for the subcutaneous tissue, Monocryl for the skin,  Steri-Strips and sterile gauze. The patient was then awakened and returned to PACU in stable and satisfactory condition. There were no complications.  POST OPERATIVE PLAN: WBAT, DVT px: SCD's/TED and chemical px  Edmonia Lynch, MD Orthopedic Surgeon [phone]   This note was generated using a template and dragon dictation system. In light of that, I have reviewed the note and all aspects of it are applicable to this case. Any dictation errors are due to the computerized dictation system.

## 2015-07-28 NOTE — Anesthesia Preprocedure Evaluation (Addendum)
Anesthesia Evaluation  Patient identified by MRN, date of birth, ID band Patient awake    Reviewed: Allergy & Precautions, NPO status , Patient's Chart, lab work & pertinent test results  Airway Mallampati: II  TM Distance: >3 FB Neck ROM: Full    Dental no notable dental hx.    Pulmonary asthma , COPD,  Pulmonary fibrosis   Pulmonary exam normal breath sounds clear to auscultation       Cardiovascular hypertension, Pt. on medications Normal cardiovascular exam Rhythm:Regular Rate:Normal  LBBB  ECHO 07/27/15  - Left ventricle: The cavity size was normal. There was mild focal basal hypertrophy of the septum. Systolic function was normal. The estimated ejection fraction was in the range of 55% to 60%. Wall motion was normal; there were no regional wall motion abnormalities. Doppler parameters are consistent with abnormal left ventricular relaxation (grade 1 diastolic dysfunction). Doppler parameters are consistent with elevated ventricular end-diastolic filling pressure. - Aortic valve: Trileaflet; mildly thickened, mildly calcified leaflets. There was no regurgitation. - Aortic root: The aortic root was normal in size. - Right ventricle: The cavity size was normal. Wall thickness was normal. Systolic function was normal. - Right atrium: The atrium was normal in size. - Tricuspid valve: There was trivial regurgitation. - Pulmonic valve: There was no regurgitation. - Pulmonary arteries: Systolic pressure was within the normal range. - Inferior vena cava: The vessel was normal in size. - Pericardium, extracardiac: There was no pericardial effusion.   Neuro/Psych Alzheimers dementia negative neurological ROS  negative psych ROS   GI/Hepatic negative GI ROS, Neg liver ROS,   Endo/Other  diabetes, Type 2, Oral Hypoglycemic Agents  Renal/GU negative Renal ROS  negative genitourinary   Musculoskeletal negative  musculoskeletal ROS (+)   Abdominal   Peds negative pediatric ROS (+)  Hematology negative hematology ROS (+)   Anesthesia Other Findings   Reproductive/Obstetrics negative OB ROS                           Anesthesia Physical Anesthesia Plan  ASA: III  Anesthesia Plan: Spinal   Post-op Pain Management:    Induction:   Airway Management Planned: Simple Face Mask  Additional Equipment:   Intra-op Plan:   Post-operative Plan:   Informed Consent: I have reviewed the patients History and Physical, chart, labs and discussed the procedure including the risks, benefits and alternatives for the proposed anesthesia with the patient or authorized representative who has indicated his/her understanding and acceptance.   Dental advisory given  Plan Discussed with: CRNA  Anesthesia Plan Comments:         Anesthesia Quick Evaluation

## 2015-07-28 NOTE — Transfer of Care (Signed)
Immediate Anesthesia Transfer of Care Note  Patient: [name] [name]  Procedure(s) Performed: Procedure(s): ANTERIOR APPROACH HEMI HIP ARTHROPLASTY (Left)  Patient Location: PACU  Anesthesia Type:Spinal  Level of Consciousness: responds to stimulation, sleeping, oriented to person only   Airway & Oxygen Therapy: Patient Spontanous Breathing and Patient connected to nasal cannula oxygen  Post-op Assessment: Report given to RN and Post -op Vital signs reviewed and stable  Post vital signs: Reviewed and stable  Last Vitals:  Filed Vitals:   07/28/15 0900 07/28/15 1310  BP: 117/69 98/51  Pulse: 86 62  Temp: 37.3 C 36.4 C  Resp: 16 11    Last Pain:  Filed Vitals:   07/28/15 1314  PainSc: Asleep         Complications: No apparent anesthesia complications

## 2015-07-28 NOTE — Anesthesia Procedure Notes (Signed)
Spinal Patient location during procedure: OR Staffing Anesthesiologist: Misheel Gowans Performed by: anesthesiologist  Preanesthetic Checklist Completed: patient identified, site marked, surgical consent, pre-op evaluation, timeout performed, IV checked, risks and benefits discussed and monitors and equipment checked Spinal Block Patient position: right lateral decubitus Prep: Betadine Patient monitoring: heart rate, continuous pulse ox and blood pressure Approach: left paramedian Location: L2-3 Injection technique: single-shot Needle Needle type: Spinocan  Needle gauge: 22 G Needle length: 9 cm Additional Notes Expiration date of kit checked and confirmed. Patient tolerated procedure well, without complications.     

## 2015-07-28 NOTE — H&P (View-Only) (Signed)
ORTHOPAEDIC CONSULTATION  REQUESTING PHYSICIAN: Caren Griffins, MD  Chief Complaint: left hip pain s/p presumed fall at nursing home / memory care on 07/26/15   Assessment: Principal Problem:   Closed left hip fracture [facility]) Active Problems:   Hypothyroidism   Essential hypertension   Asthma   GERD   Dementia  Acute comminuted subcapital left femoral neck fracture.  Hemiarthroplasty is indicated  Plan: NPO after midnight. Left Hip Hemiarthroplasty planned for P.M. 07/28/15. Weight Bearing Status: NWB.  Plan for WBAT post op VTE px: SCD's and heparin per primary.  HPI: [name] [name] is a 82 y.o. female with a history of asthma, type 2 diabetes, hyperlipidemia, hypertension, chronic headaches, COPD, pulmonary fibrosis, B12 deficiency, vitamin D deficiency, depression, Alzheimer's dementia, hypothyroidism who is usually ambulatory at the memory nursing home facility.    She complains of left hip pain after presumed fall at nursing home / memory care on 07/26/15.  XR shows Acute comminuted subcapital left femoral neck fracture.  Orthopedics was consulted for evaluation.   Past Medical History  Diagnosis Date  . Asthma   . Type II or unspecified type diabetes mellitus without mention of complication, not stated as uncontrolled   . Hyperlipidemia   . Chronic headaches   . Hypertension   . Pulmonary fibrosis (HCC)     Chronic granulomatous changes on CT chest, minimal mediastinal LAN 3/09  . COPD (chronic obstructive pulmonary disease) ([place])   . Vitamin D deficiency   . B12 deficiency   . Allergy   . Dementia   . Depression   . Hypothyroidism   . CKD (chronic kidney disease) stage 3, GFR 30-59 ml/min    Past Surgical History  Procedure Laterality Date  . Appendectomy    . Thyroidectomy    . Tubal ligation    . Esophagogastroduodenoscopy  2012    Gastritis  . Breast biopsy      right breast/benign  . Endometrial biopsy  1998    negative   Social History    Social History  . Marital Status: Widowed    Spouse Name: N/A  . Number of Children: N/A  . Years of Education: N/A   Social History Main Topics  . Smoking status: Never Smoker   . Smokeless tobacco: Never Used  . Alcohol Use: No  . Drug Use: No  . Sexual Activity: Not Asked   Other Topics Concern  . None   Social History Narrative   Family History  Problem Relation Age of Onset  . Asthma Sister   . Cancer Sister     breast, colon  . Diabetes Mother   . Cancer Mother     breast  . Diabetes Sister   . Cancer Sister 46    breast  . Heart disease Father   . Hypertension Father   . Cancer Brother     leukemia  . Cancer Brother     lung  . Hypertension Brother   . Hyperlipidemia Brother    Allergies  Allergen Reactions  . Iron     GI  . Meloxicam Other (See Comments)    Abd pain  . Meperidine Hcl Other (See Comments)    agitated  . Pravastatin     myalgias  . Zolpidem Tartrate Other (See Comments)    confusion  . Codeine Palpitations    "heart raced"   Prior to Admission medications   Medication Sig Start Date End Date Taking? Authorizing Provider  clonazePAM (  KLONOPIN) 0.5 MG tablet Take 0.25 mg by mouth daily as needed for anxiety.   Yes Historical Provider, MD  clonazePAM (KLONOPIN) 1 MG tablet Take 1 mg by mouth at bedtime.   Yes Historical Provider, MD  Cranberry 425 MG CAPS Take 1 capsule by mouth daily.   Yes Historical Provider, MD  docusate sodium (COLACE) 100 MG capsule Take 100 mg by mouth daily.   Yes Historical Provider, MD  levothyroxine (SYNTHROID, LEVOTHROID) 25 MCG tablet Take 25 mcg by mouth daily before breakfast.   Yes Historical Provider, MD  loratadine (CLARITIN) 10 MG tablet Take 10 mg by mouth every morning.    Yes Historical Provider, MD  Melatonin 10 MG CAPS Take 1 capsule by mouth daily.   Yes Historical Provider, MD  sertraline (ZOLOFT) 100 MG tablet Take 100 mg by mouth daily. Take along with the 25mg tablet to make a total  dose of 125mg daily   Yes Historical Provider, MD  sertraline (ZOLOFT) 25 MG tablet Take 25 mg by mouth daily. Take along with the 100mg tablet to make a total of 125mg daily   Yes Historical Provider, MD  traZODone (DESYREL) 100 MG tablet Take 100 mg by mouth at bedtime.   Yes Historical Provider, MD  verapamil (CALAN-SR) 240 MG CR tablet take 1 tablet by mouth once daily for blood pressure 01/06/15  Yes William McKeown, MD  benazepril (LOTENSIN) 10 MG tablet Take 1 tablet (10 mg total) by mouth daily. Patient not taking: Reported on 07/26/2015 06/16/14   Courtney Forcucci, PA-C  citalopram (CELEXA) 20 MG tablet Take 1 tablet daily Patient not taking: Reported on 07/26/2015 09/23/14   William McKeown, MD  fluconazole (DIFLUCAN) 150 MG tablet Take 1 tablet 2 times a week, on Monday and Thursday, for 4 weeks. Patient not taking: Reported on 07/26/2015 03/10/15   William McKeown, MD  haloperidol (HALDOL) 5 MG tablet Take 1 tablet 1 x day with Supper (5pm)  and q4hr prn agitation. (Max 6 total doses/24 hr) Hold supper dose if patient sedate Patient not taking: Reported on 07/26/2015 03/02/15   William McKeown, MD  hydrochlorothiazide (HYDRODIURIL) 25 MG tablet Take 1 tablet (25 mg total) by mouth daily. Patient not taking: Reported on 07/26/2015 05/12/15   William McKeown, MD  levothyroxine (SYNTHROID, LEVOTHROID) 75 MCG tablet take 1 tablet by mouth once daily Patient not taking: Reported on 07/26/2015 07/27/14   William McKeown, MD  metFORMIN (GLUCOPHAGE-XR) 500 MG 24 hr tablet take 1 to 2 tablets by mouth twice a day after meals Patient not taking: Reported on 07/26/2015 03/31/14   William McKeown, MD  traZODone (DESYREL) 150 MG tablet Take 1/2   tablet daily at 5PM Patient not taking: Reported on 07/26/2015 03/02/15   William McKeown, MD   Dg Chest 1 View  07/26/2015  CLINICAL DATA:  Left femoral neck fracture. Preoperative respiratory evaluation. EXAM: CHEST 1 VIEW COMPARISON:  08/04/2014 and earlier. FINDINGS:  AP supine examination was performed. Suboptimal inspiration. Cardiac silhouette mildly to moderately enlarged, unchanged. Pulmonary venous hypertension with perhaps mild perihilar pulmonary edema. Lungs otherwise clear. No pleural effusions. IMPRESSION: Stable cardiomegaly.  Possible mild perihilar pulmonary edema. Electronically Signed   By: Thomas  Lawrence M.D.   On: 07/26/2015 20:48   Ct Head Wo Contrast  07/26/2015  CLINICAL DATA:  Fall.  Trauma. EXAM: CT HEAD WITHOUT CONTRAST TECHNIQUE: Contiguous axial images were obtained from the base of the skull through the vertex without intravenous contrast. COMPARISON:  February 22, 2014 FINDINGS:   No subdural, epidural, or subarachnoid hemorrhage. Cerebellum, brainstem, and basal cisterns are normal. Moderate white matter changes are stable. Ventricles and sulci are unchanged. No acute cortical ischemia or infarct. Basal ganglia calcifications are noted. IMPRESSION: No acute intracranial process. Electronically Signed   By: Dorise Bullion III M.D   On: 07/26/2015 20:43   Ct Cervical Spine Wo Contrast  07/26/2015  CLINICAL DATA:  Pain after fall. EXAM: CT CERVICAL SPINE WITHOUT CONTRAST TECHNIQUE: Multidetector CT imaging of the cervical spine was performed without intravenous contrast. Multiplanar CT image reconstructions were also generated. COMPARISON:  None. FINDINGS: No fracture or traumatic malalignment. 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 Multilevel degenerative changes are noted. IMPRESSION: No fracture or traumatic malalignment.  Degenerative change. Electronically Signed   By: Dorise Bullion III M.D   On: 07/26/2015 20:53   Dg Hip Unilat With Pelvis 2-3 Views Left  07/26/2015  CLINICAL DATA:  82 year old nursing home patient who is unresponsive but was found to have a left hip fracture on a mobile x-ray examination earlier today. EXAM: DG HIP (WITH OR WITHOUT PELVIS) 2-3V LEFT COMPARISON:  None. The mobile x-ray is not available for  comparison. FINDINGS: Acute comminuted subcapital left femoral neck fracture. Hip joint intact with mild joint space narrowing. Included AP pelvis demonstrates no fractures elsewhere. Symmetric mild joint space narrowing in the contralateral right hip. Sacroiliac joints symphysis pubis intact. Degenerative changes involving the visualized lower lumbar spine. IMPRESSION: Acute comminuted subcapital left femoral neck fracture. Electronically Signed   By: Evangeline Dakin M.D.   On: 07/26/2015 20:47    Positive ROS: All other systems have been reviewed and were otherwise negative with the exception of those mentioned in the HPI and as above.  Objective: Labs cbc  Recent Labs  07/26/15 2001  WBC 14.1*  HGB 12.2  HCT 35.7*  PLT 226    Labs inflam No results for input(s): CRP in the last 72 hours.  Invalid input(s): ESR  Labs coag  Recent Labs  07/26/15 2001  INR 1.13     Recent Labs  07/26/15 2001  NA 135  K 3.5  CL 103  CO2 23  GLUCOSE 202*  BUN 12  CREATININE 0.73  CALCIUM 9.2    Physical Exam: Filed Vitals:   07/26/15 2210 07/27/15 0230  BP: 169/76 166/70  Pulse: 89 84  Temp: 97.4 F (36.3 C) 98.4 F (36.9 C)  Resp: 18 17   General: Alert, uncomfortable appearing intermittently in no acute distress.  [name] - eldest son at bedside. Cardiovascular: No pedal edema Respiratory: No cyanosis, no use of accessory musculature GI: No organomegaly, abdomen is soft and non-tender Skin: No lesions in the area of chief complaint.  Mild bruising. Neurologic: Does not respond to questions. Psychiatric: Not oriented / intermittently oriented to person at baseline.  MUSCULOSKELETAL:  Pain with palpation and movement of of left hip which is shortened and externally rotated.  Foot warm.  Son reports history of DM neuropathy. Other extremities are atraumatic.   Prudencio Burly III PA-C 07/27/2015 6:25 AM

## 2015-07-28 NOTE — Anesthesia Postprocedure Evaluation (Signed)
Anesthesia Post Note  Patient: [name] [name]  Procedure(s) Performed: Procedure(s) (LRB): ANTERIOR APPROACH HEMI HIP ARTHROPLASTY (Left)  Patient location during evaluation: PACU Anesthesia Type: Spinal Level of consciousness: awake and alert Pain management: pain level controlled Vital Signs Assessment: post-procedure vital signs reviewed and stable Respiratory status: spontaneous breathing and respiratory function stable Cardiovascular status: blood pressure returned to baseline and stable Postop Assessment: no headache, no backache and spinal receding Anesthetic complications: no    Last Vitals:  Filed Vitals:   07/28/15 1400 07/28/15 1415  BP: 122/64 131/58  Pulse: 73 73  Temp:    Resp: 13 13    Last Pain:  Filed Vitals:   07/28/15 1430  PainSc: 0-No pain                 Montez Hageman

## 2015-07-28 NOTE — Care Management Important Message (Signed)
Important Message  Patient Details  Name: [name] [name] MRN: [id number] Date of Birth: 01-18-1930   Medicare Important Message Given:  Yes    Loann Quill 07/28/2015, 9:57 AM

## 2015-07-28 NOTE — Interval H&P Note (Signed)
History and Physical Interval Note:  07/28/2015 9:37 AM  [name] [name]  has presented today for surgery, with the diagnosis of Left hip fracture  The various methods of treatment have been discussed with the patient and family. After consideration of risks, benefits and other options for treatment, the patient has consented to  Procedure(s): ARTHROPLASTY BIPOLAR HIP (HEMIARTHROPLASTY) (Left) as a surgical intervention .  The patient's history has been reviewed, patient examined, no change in status, stable for surgery.  I have reviewed the patient's chart and labs.  Questions were answered to the patient's satisfaction.     Ananth Fiallos D

## 2015-07-28 NOTE — Progress Notes (Signed)
Family concerned about patient becoming restless- MD paged

## 2015-07-29 DIAGNOSIS — F0391 Unspecified dementia with behavioral disturbance: Secondary | ICD-10-CM

## 2015-07-29 LAB — CBC WITH DIFFERENTIAL/PLATELET
Basophils Absolute: 0 10*3/uL (ref 0.0–0.1)
Basophils Relative: 0 %
EOS PCT: 5 %
Eosinophils Absolute: 0.4 10*3/uL (ref 0.0–0.7)
HEMATOCRIT: 31 % — AB (ref 36.0–46.0)
Hemoglobin: 10.3 g/dL — ABNORMAL LOW (ref 12.0–15.0)
LYMPHS ABS: 0.4 10*3/uL — AB (ref 0.7–4.0)
LYMPHS PCT: 5 %
MCH: 30.2 pg (ref 26.0–34.0)
MCHC: 33.2 g/dL (ref 30.0–36.0)
MCV: 90.9 fL (ref 78.0–100.0)
MONO ABS: 0.6 10*3/uL (ref 0.1–1.0)
MONOS PCT: 7 %
NEUTROS ABS: 7.2 10*3/uL (ref 1.7–7.7)
Neutrophils Relative %: 83 %
Platelets: 178 10*3/uL (ref 150–400)
RBC: 3.41 MIL/uL — ABNORMAL LOW (ref 3.87–5.11)
RDW: 13.2 % (ref 11.5–15.5)
WBC: 8.7 10*3/uL (ref 4.0–10.5)

## 2015-07-29 LAB — GLUCOSE, CAPILLARY
GLUCOSE-CAPILLARY: 154 mg/dL — AB (ref 65–99)
GLUCOSE-CAPILLARY: 251 mg/dL — AB (ref 65–99)
GLUCOSE-CAPILLARY: 151 mg/dL — AB (ref 65–99)
Glucose-Capillary: 359 mg/dL — ABNORMAL HIGH (ref 65–99)

## 2015-07-29 LAB — COMPREHENSIVE METABOLIC PANEL
ALT: 53 U/L (ref 14–54)
AST: 78 U/L — AB (ref 15–41)
Albumin: 2.6 g/dL — ABNORMAL LOW (ref 3.5–5.0)
Alkaline Phosphatase: 155 U/L — ABNORMAL HIGH (ref 38–126)
Anion gap: 10 (ref 5–15)
BILIRUBIN TOTAL: 0.9 mg/dL (ref 0.3–1.2)
BUN: 24 mg/dL — AB (ref 6–20)
CO2: 22 mmol/L (ref 22–32)
Calcium: 8.1 mg/dL — ABNORMAL LOW (ref 8.9–10.3)
Chloride: 105 mmol/L (ref 101–111)
Creatinine, Ser: 1.01 mg/dL — ABNORMAL HIGH (ref 0.44–1.00)
GFR, EST AFRICAN AMERICAN: 58 mL/min — AB (ref >60)
GFR, EST NON AFRICAN AMERICAN: 50 mL/min — AB (ref >60)
Glucose, Bld: 169 mg/dL — ABNORMAL HIGH (ref 65–99)
POTASSIUM: 3.3 mmol/L — AB (ref 3.5–5.1)
Sodium: 137 mmol/L (ref 135–145)
TOTAL PROTEIN: 5.4 g/dL — AB (ref 6.5–8.1)

## 2015-07-29 MED ORDER — LEVOTHYROXINE SODIUM 25 MCG PO TABS
25.0000 ug | ORAL_TABLET | Freq: Every day | ORAL | Status: DC
  Administered 2015-07-30 – 2015-08-01 (×3): 25 ug via ORAL
  Filled 2015-07-29 (×3): qty 1

## 2015-07-29 MED ORDER — SERTRALINE HCL 100 MG PO TABS: 100.0000 mg | ORAL_TABLET | Freq: Every day | ORAL | Status: DC

## 2015-07-29 MED ORDER — LORATADINE 10 MG PO TABS
10.0000 mg | ORAL_TABLET | Freq: Every morning | ORAL | Status: DC
  Administered 2015-07-30 – 2015-08-01 (×3): 10 mg via ORAL
  Filled 2015-07-29 (×3): qty 1

## 2015-07-29 MED ORDER — SERTRALINE HCL 25 MG PO TABS: 25.0000 mg | ORAL_TABLET | Freq: Every day | ORAL | Status: DC

## 2015-07-29 MED ORDER — CRANBERRY 425 MG PO CAPS: 1.0000 | ORAL_CAPSULE | Freq: Every day | ORAL | Status: DC

## 2015-07-29 MED ORDER — CLONAZEPAM 1 MG PO TABS
1.0000 mg | ORAL_TABLET | Freq: Every day | ORAL | Status: DC
  Administered 2015-07-29 – 2015-07-30 (×2): 1 mg via ORAL
  Filled 2015-07-29 (×2): qty 1

## 2015-07-29 MED ORDER — TRAZODONE HCL 100 MG PO TABS
100.0000 mg | ORAL_TABLET | Freq: Every day | ORAL | Status: DC
  Administered 2015-07-29 – 2015-07-30 (×2): 100 mg via ORAL
  Filled 2015-07-29 (×2): qty 1

## 2015-07-29 MED ORDER — VERAPAMIL HCL ER 240 MG PO TBCR
240.0000 mg | EXTENDED_RELEASE_TABLET | Freq: Every day | ORAL | Status: DC
  Administered 2015-07-30 – 2015-08-01 (×3): 240 mg via ORAL
  Filled 2015-07-29 (×3): qty 1

## 2015-07-29 MED ORDER — CLONAZEPAM 0.5 MG PO TABS
0.2500 mg | ORAL_TABLET | Freq: Every day | ORAL | Status: DC | PRN
  Administered 2015-07-29 – 2015-07-30 (×2): 0.25 mg via ORAL
  Filled 2015-07-29 (×2): qty 1

## 2015-07-29 MED ORDER — SERTRALINE HCL 25 MG PO TABS
125.0000 mg | ORAL_TABLET | Freq: Every day | ORAL | Status: DC
  Administered 2015-07-29 – 2015-08-01 (×4): 125 mg via ORAL
  Filled 2015-07-29 (×4): qty 1

## 2015-07-29 NOTE — Clinical Social Work Placement (Signed)
   CLINICAL SOCIAL WORK PLACEMENT  NOTE  Date:  07/29/2015  Patient Details  Name: [name] [name] MRN: [id number] Date of Birth: 08-18-1929  Clinical Social Work is seeking post-discharge placement for this patient at the [place] level of care (*CSW will initial, date and re-position this form in  chart as items are completed):  Yes   Patient/family provided with [place] Work Department's list of facilities offering this level of care within the geographic area requested by the patient (or if unable, by the patient's family).  Yes   Patient/family informed of their freedom to choose among providers that offer the needed level of care, that participate in Medicare, Medicaid or managed care program needed by the patient, have an available bed and are willing to accept the patient.  Yes   Patient/family informed of [place]'s ownership interest in [facility] and [facility], as well as of the fact that they are under no obligation to receive care at these facilities.  PASRR submitted to EDS on 07/29/15     PASRR number received on       Existing PASRR number confirmed on       FL2 transmitted to all facilities in geographic area requested by pt/family on 07/29/15     FL2 transmitted to all facilities within larger geographic area on       Patient informed that his/her managed care company has contracts with or will negotiate with certain facilities, including the following:            Patient/family informed of bed offers received.  Patient chooses bed at       Physician recommends and patient chooses bed at      Patient to be transferred to   on  .  Patient to be transferred to facility by       Patient family notified on   of transfer.  Name of family member notified:        PHYSICIAN       Additional Comment:    _______________________________________________ Candie Chroman, LCSW 07/29/2015, 12:04 PM

## 2015-07-29 NOTE — Progress Notes (Signed)
PHARMACIST - PHYSICIAN ORDER COMMUNICATION  CONCERNING: P&T Medication Policy on Herbal Medications  DESCRIPTION:  This patient's order for:  Cranberry Caps 425 mg has been noted.  This product(s) is classified as an "herbal" or natural product. Due to a lack of definitive safety studies or FDA approval, nonstandard manufacturing practices, plus the potential risk of unknown drug-drug interactions while on inpatient medications, the Pharmacy and Therapeutics Committee does not permit the use of "herbal" or natural products of this type within [facility].   ACTION TAKEN: The pharmacy department is unable to verify this order at this time.   Please reevaluate patient's clinical condition at discharge and address if the herbal or natural product(s) should be resumed at that time.   Nicole Cella, RPh Clinical Pharmacist Pager: [phone] 07/29/2015 1:08 PM

## 2015-07-29 NOTE — Clinical Social Work Note (Signed)
Patient's son notified that they were accepted at [facility].  [name] [name], [place]

## 2015-07-29 NOTE — Clinical Social Work Placement (Signed)
   CLINICAL SOCIAL WORK PLACEMENT  NOTE  Date:  07/29/2015  Patient Details  Name: REIGHLYN KINYON MRN: [id number] Date of Birth: [birth year]  Clinical Social Work is seeking post-discharge placement for this patient at the [place] level of care (*CSW will initial, date and re-position this form in  chart as items are completed):  Yes   Patient/family provided with [place] Work Department's list of facilities offering this level of care within the geographic area requested by the patient (or if unable, by the patient's family).  Yes   Patient/family informed of their freedom to choose among providers that offer the needed level of care, that participate in Medicare, Medicaid or managed care program needed by the patient, have an available bed and are willing to accept the patient.  Yes   Patient/family informed of [place]'s ownership interest in [facility] and [facility], as well as of the fact that they are under no obligation to receive care at these facilities.  PASRR submitted to EDS on 07/29/15     PASRR number received on       Existing PASRR number confirmed on       FL2 transmitted to all facilities in geographic area requested by pt/family on 07/29/15     FL2 transmitted to all facilities within larger geographic area on       Patient informed that his/her managed care company has contracts with or will negotiate with certain facilities, including the following:        Yes   Patient/family informed of bed offers received.  Patient [facility] bed at [facility]     Physician recommends and patient chooses bed at      Patient to be transferred to [facility] on  .  Patient to be transferred to facility by       Patient family notified on   of transfer.  Name of family member notified:        PHYSICIAN Please sign FL2, Please sign DNR     Additional Comment:     _______________________________________________ Candie Chroman, LCSW 07/29/2015, 1:43 PM

## 2015-07-29 NOTE — Evaluation (Addendum)
Physical Therapy Evaluation Patient Details Name: HALAYA DENNE MRN: [id number] DOB: 1929-11-16 Today's Date: 07/29/2015   History of Present Illness  82 yo female s/p anterior approach L hip hemi 07/28/15. Hx of Alz dementia, DM, asthma, HTN, COPD, pulm fibrosis.  Clinical Impression  On eval, pt required Mod assist +2 for mobility. She was able to perform a stand pivot and to take a few steps forward with assist and max multimodal cueing. Pt did c/o pain with movement. Daughter present-assisted with pt as well. Recommend SNF for continued rehab.     Follow Up Recommendations SNF    Equipment Recommendations  Rolling walker with 5" wheels    Recommendations for Other Services       Precautions / Restrictions Precautions Precautions: Fall Restrictions Weight Bearing Restrictions: No LLE Weight Bearing: Weight bearing as tolerated      Mobility  Bed Mobility Overal bed mobility: Needs Assistance;+ 2 for safety/equipment;+2 for physical assistance Bed Mobility: Supine to Sit     Supine to sit: Mod assist;+2 for physical assistance;+2 for safety/equipment;HOB elevated     General bed mobility comments: Assist for trunk and bil LEs. Max multimodal cueing required. Utilized bedpad for scooting, positioning.   Transfers Overall transfer level: Needs assistance Equipment used: Rolling walker (2 wheeled) Transfers: Sit to/from [facility] Sit to Stand: From elevated surface;Mod assist;+2 physical assistance;+2 safety/equipment         General transfer comment: Assist to rise, stabilize, control descent. Max multimodal cueing required. Stand pivot, bed to recliner, with RW.   Ambulation/Gait   Ambulation Distance (Feet): 3 Feet Assistive device: Rolling walker (2 wheeled) Gait Pattern/deviations: Step-to pattern;Antalgic     General Gait Details: Assist to maneuver with RW and stabilize pt.   Stairs            Wheelchair Mobility    Modified  [place] (Stroke Patients Only)       Balance Overall balance assessment: Needs assistance;History of Falls         Standing balance support: Bilateral upper extremity supported;During functional activity Standing balance-Leahy Scale: Poor                               Pertinent Vitals/Pain Pain Assessment: Faces Faces Pain Scale: Hurts even more Pain Location: L hip/thigh area with activity Pain Descriptors / Indicators: Guarding;Grimacing;Sore Pain Intervention(s): Limited activity within patient's tolerance;Repositioned    Home Living Family/patient expects to be discharged to:: Unsure. Likely SNF              Home Equipment: Cane - single point      Prior Function           Comments: was ambulatory with cane in memory care unit     Hand Dominance        Extremity/Trunk Assessment   Upper Extremity Assessment: Defer to OT evaluation           Lower Extremity Assessment: Difficult to assess due to impaired cognition      Cervical / Trunk Assessment: Kyphotic  Communication   Communication: No difficulties  Cognition Arousal/Alertness: Awake/alert Behavior During Therapy: WFL for tasks assessed/performed Overall Cognitive Status: History of cognitive impairments - at baseline       Memory: Decreased recall of precautions;Decreased short-term memory              General Comments      Exercises  Assessment/Plan    PT Assessment Patient needs continued PT services  PT Diagnosis Difficulty walking;Generalized weakness;Acute pain;Altered mental status   PT Problem List Decreased strength;Decreased range of motion;Decreased activity tolerance;Decreased balance;Decreased mobility;Decreased knowledge of precautions;Decreased knowledge of use of DME;Pain;Decreased cognition  PT Treatment Interventions DME instruction;Gait training;Functional mobility training;Therapeutic activities;Patient/family education;Balance  training;Therapeutic exercise   PT Goals (Current goals can be found in the Care Plan section) Acute Rehab PT Goals Patient Stated Goal: per daughter-to return to baseline PT Goal Formulation: With family Time For Goal Achievement: 08/12/15 Potential to Achieve Goals: Good    Frequency Min 3X/week   Barriers to discharge        Co-evaluation               End of Session Equipment Utilized During Treatment: Gait belt Activity Tolerance: Patient limited by pain Patient left: in chair;with call bell/phone within reach;with chair alarm set;with family/visitor present           Time: 0940-1000 PT Time Calculation (min) (ACUTE ONLY): 20 min   Charges:   PT Evaluation $PT Eval Low Complexity: 1 Procedure     PT G Codes:        Weston Anna, MPT Pager: [phone]

## 2015-07-29 NOTE — Progress Notes (Signed)
Patient is calm and playing with grandchildren at this time- will give ativan if needed.

## 2015-07-29 NOTE — Progress Notes (Signed)
   Assessment: 1 Day Post-Op  S/P Procedure(s) (LRB): ANTERIOR APPROACH HEMI HIP ARTHROPLASTY (Left) by Dr. Ernesta Amble. Percell Miller on 07/28/15  Principal Problem:   Closed left hip fracture ([place]) Active Problems:   Hypothyroidism   Essential hypertension   Asthma   GERD   Dementia   Plan: Up with therapy Weight Bearing: Weight Bearing as Tolerated (WBAT) left leg Dressings: prn VTE prophylaxis: Per primary.  Heparin while inpatient.  ASA after d/c. Dispo: Per primary.    Subjective: Awake, alert, smiling.  Patient reports pain as mild / controlled.   Objective:   VITALS:   Filed Vitals:   07/28/15 2350 07/29/15 0130 07/29/15 0317 07/29/15 0506  BP: 135/65 140/69 135/68 133/76  Pulse: 92 90 90 94  Temp: 98.9 F (37.2 C) 98.8 F (37.1 C) 98 F (36.7 C) 97.7 F (36.5 C)  TempSrc: Axillary Axillary Axillary Axillary  Resp: 14 14 14 14   SpO2: 91% 92% 93% 93%    Physical Exam General: NAD.  Sitting up and smiling in bed.  Family at bedside  MSK: Neurovascular intact Sensation intact distally Dorsiflexion/Plantar flexion intact Incision: dressing C/D/I   Prudencio Burly III 07/29/2015, 6:47 AM

## 2015-07-29 NOTE — Progress Notes (Signed)
OT Cancellation Note  Patient Details Name: [name] [name] MRN: [id number] DOB: January 14, 1930   Cancelled Treatment:    Reason Eval/Treat Not Completed:  Pt has secured a bed at SNF. Will defer OT eval to SNF. Contact [phone] if OT assessment is required for hospital d/c. Malka So 07/29/2015, 1:51 PM  [phone]

## 2015-07-29 NOTE — Clinical Social Work Note (Signed)
Clinical Social Work Assessment  Patient Details  Name: [name] [name] MRN: [id number] Date of Birth: [birth year]  Date of referral:  07/29/15               Reason for consult:  Facility Placement, Discharge Planning                Permission sought to share information with:  Facility Sport and exercise psychologist, Family Supports Permission granted to share information::  Yes, Verbal Permission Granted  Name::     [place]::  SNF's  Relationship::  Son/HCPOA  Contact Information:  [phone]  Housing/Transportation Living arrangements for the past 2 months:  [place] of Information:  Medical Team, Adult Children Patient Interpreter Needed:  None Criminal Activity/Legal Involvement Pertinent to Current Situation/Hospitalization:  No - Comment as needed Significant Relationships:  Adult Children Lives with:  Facility Resident Do you feel safe going back to the place where you live?  Yes Need for family participation in patient care:  Yes (Comment)  Care giving concerns:  PT recommending SNF once medically stable for discharge.   Social Worker assessment / plan:  CSW met with patient. Multiple family members at bedside. Patient disoriented x 4 so CSW, son/HCPOA, daughter-in-law, and daughter stepped out to discuss PT recommendations. Patient's family agreeable to SNF placement. Discussed preferences: [name] and [name]. [facility] notified as first preference. Patient's family discussed medication concerns. CSW notified RN who then paged MD. No further concerns. CSW encouraged patient's family to contact CSW as needed. CSW will continue to follow patient and facilitate discharge to SNF once medically stable.  Employment status:  Retired Nurse, adult PT Recommendations:  [place] / Referral to community resources:  [place]  Patient/Family's Response to care:  Patient disoriented.  Patient's family agreeable to SNF placement. Patient's family supportive and involved in patient's care. Patient's family appreciated social work intervention.  Patient/Family's Understanding of and Emotional Response to Diagnosis, Current Treatment, and Prognosis:  Patient disoriented. Patient's family understands need for short-term rehab before returning to [facility].  Emotional Assessment Appearance:  Appears stated age Attitude/Demeanor/Rapport:  Unable to Assess Affect (typically observed):  Unable to Assess Orientation:    Alcohol / Substance use:  Never Used Psych involvement (Current and /or in the community):  No (Comment)  Discharge Needs  Concerns to be addressed:  Care Coordination Readmission within the last 30 days:  No Current discharge risk:  Cognitively Impaired, Dependent with Mobility Barriers to Discharge:  No Barriers Identified   Candie Chroman, LCSW 07/29/2015, 11:59 AM

## 2015-07-29 NOTE — Progress Notes (Signed)
Patient's wanted to speak to MD- MD has been paged.

## 2015-07-29 NOTE — Progress Notes (Signed)
PROGRESS NOTE  [name] [name] [id number] DOB: March 25, 1929 DOA: 07/26/2015 PCP: Alesia Richards, MD   LOS: 3 days   Brief Narrative: 82 y.o. female with medical history significant of asthma, type 2 diabetes, hyperlipidemia, hypertension, chronic headaches, COPD, pulmonary fibrosis, B12 deficiency, vitamin D deficiency, depression, Alzheimer's dementia, hypothyroidism who is usually ambulatory at the nursing home facility where she lives and comes via EMS to the emergency department due to having a left hip fracture  Assessment & Plan: Principal Problem:   Closed left hip fracture ([place]) Active Problems:   Hypothyroidism   Essential hypertension   Asthma   GERD   Dementia  Acute encephalopathy / febrile episode 6/28 - Patient with progressive lethargy 6/28, rapid response called, had a febrile episode and antibiotics were started - cultures obtained, negative - continue Zosyn today, stopped vancomycin 6/29 as MRSA negative - c/b delirium / sundowning overnight. Resume her home Zoloft, Clonazepam and Trazodone QHS - per family fever 101 last night, however I see no record of that  Closed left hip fracture [facility]) - Orthopedic surgery consulted, appreciate input. She is s/p anterior approach hemi hip arthroplasty 6/29 - PT to eval   LBBB  - 2D echo done 6/28, normal EF, no WMA, grade 1 DD - cardiology evaluated pre-op  Hypothyroidism - Resume levothyroxine   Essential hypertension - continue current management. BP 133/76  Asthma - As symptomatic at this time. - Bronchodilators as needed.  GERD  Dementia - Supportive care. - Resume psychotropics medications today   DVT prophylaxis: heparin Code Status: DNR Family Communication: d/w son and multiple other family members bedside Disposition Plan: TBD  Consultants:   Orthopedic surgery  Cardiology  Procedures:   2D echo  Foley  Antimicrobials:  Zosyn 6/28 >>  Vancomycin 6/28 >> 6/29     Subjective: - agitated, trying to get up from chair  Objective: Filed Vitals:   07/29/15 0130 07/29/15 0317 07/29/15 0506 07/29/15 1026  BP: 140/69 135/68 133/76   Pulse: 90 90 94   Temp: 98.8 F (37.1 C) 98 F (36.7 C) 97.7 F (36.5 C)   TempSrc: Axillary Axillary Axillary   Resp: 14 14 14    Weight:    73.9 kg (162 lb 14.7 oz)  SpO2: 92% 93% 93%     Intake/Output Summary (Last 24 hours) at 07/29/15 1247 Last data filed at 07/29/15 1200  Gross per 24 hour  Intake    520 ml  Output    675 ml  Net   -155 ml   Filed Weights   07/29/15 1026  Weight: 73.9 kg (162 lb 14.7 oz)    Examination: Constitutional: agitated  Filed Vitals:   07/29/15 0130 07/29/15 0317 07/29/15 0506 07/29/15 1026  BP: 140/69 135/68 133/76   Pulse: 90 90 94   Temp: 98.8 F (37.1 C) 98 F (36.7 C) 97.7 F (36.5 C)   TempSrc: Axillary Axillary Axillary   Resp: 14 14 14    Weight:    73.9 kg (162 lb 14.7 oz)  SpO2: 92% 93% 93%    Eyes: PERRL Respiratory:  No crackles, no wheezing. No accessory muscle use.  Cardiovascular: Regular rate and rhythm, no murmurs / rubs / gallops. Trace LE edema. 2+ pedal pulses. Abdomen: no tenderness. Bowel sounds positive.  Musculoskeletal: no clubbing / cyanosis.  Skin: no rashes, lesions, ulcers. No induration Neurologic: non focal    Data Reviewed: I have personally reviewed following labs and imaging studies  CBC:  Recent Labs Lab 07/26/15  2001 07/27/15 0740 07/27/15 1322 07/28/15 0356 07/29/15 0658  WBC 14.1* 13.0* 12.0* 8.4 8.7  NEUTROABS 11.9* 11.4* 10.4* 6.5 7.2  HGB 12.2 11.5* 12.2 10.3* 10.3*  HCT 35.7* 34.1* 36.8 32.1* 31.0*  MCV 87.3 86.3 89.1 89.9 90.9  PLT 226 196 209 194 0000000   Basic Metabolic Panel:  Recent Labs Lab 07/26/15 2001 07/27/15 0740 07/27/15 1322 07/28/15 0356 07/29/15 0658  NA 135 137 137 136 137  K 3.5 3.4* 3.3* 3.3* 3.3*  CL 103 106 103 104 105  CO2 23 23 26 23 22   GLUCOSE 202* 174* 163* 124* 169*  BUN 12  16 15  23* 24*  CREATININE 0.73 0.72 0.76 0.85 1.01*  CALCIUM 9.2 9.0 8.9 8.5* 8.1*   GFR: Estimated Creatinine Clearance: 42.6 mL/min (by C-G formula based on Cr of 1.01). Liver Function Tests:  Recent Labs Lab 07/26/15 2001 07/27/15 0740 07/27/15 1322 07/28/15 0356 07/29/15 0658  AST 38 33 33 54* 78*  ALT 34 31 32 53 53  ALKPHOS 95 87 105 89 155*  BILITOT 0.9 0.7 0.9 0.8 0.9  PROT 6.7 6.0* 6.5 5.4* 5.4*  ALBUMIN 3.6 3.2* 3.3* 2.7* 2.6*   No results for input(s): LIPASE, AMYLASE in the last 168 hours. No results for input(s): AMMONIA in the last 168 hours. Coagulation Profile:  Recent Labs Lab 07/26/15 2001 07/27/15 1322  INR 1.13 1.06   Cardiac Enzymes: No results for input(s): CKTOTAL, CKMB, CKMBINDEX, TROPONINI in the last 168 hours. BNP (last 3 results) No results for input(s): PROBNP in the last 8760 hours. HbA1C: No results for input(s): HGBA1C in the last 72 hours. CBG:  Recent Labs Lab 07/28/15 0920 07/28/15 1642 07/28/15 2121 07/29/15 0557 07/29/15 0903  GLUCAP 125* 102* 223* 154* 251*   Lipid Profile: No results for input(s): CHOL, HDL, LDLCALC, TRIG, CHOLHDL, LDLDIRECT in the last 72 hours. Thyroid Function Tests:  Recent Labs  07/27/15 1322  TSH 2.624   Anemia Panel: No results for input(s): VITAMINB12, FOLATE, FERRITIN, TIBC, IRON, RETICCTPCT in the last 72 hours. Urine analysis:    Component Value Date/Time   COLORURINE YELLOW 07/26/2015 2114   APPEARANCEUR CLEAR 07/26/2015 2114   LABSPEC 1.018 07/26/2015 2114   PHURINE 7.5 07/26/2015 2114   GLUCOSEU 500* 07/26/2015 2114   HGBUR NEGATIVE 07/26/2015 2114   BILIRUBINUR NEGATIVE 07/26/2015 2114   KETONESUR 15* 07/26/2015 2114   PROTEINUR 100* 07/26/2015 2114   UROBILINOGEN 0.2 08/04/2014 1544   NITRITE NEGATIVE 07/26/2015 2114   LEUKOCYTESUR NEGATIVE 07/26/2015 2114   Sepsis Labs: Invalid input(s): PROCALCITONIN, LACTICIDVEN  Recent Results (from the past 240 hour(s))  Urine  culture     Status: None   Collection Time: 07/26/15  9:14 PM  Result Value Ref Range Status   Specimen Description URINE, CATHETERIZED  Final   Special Requests NONE  Final   Culture NO GROWTH  Final   Report Status 07/28/2015 FINAL  Final  Culture, blood (x 2)     Status: None (Preliminary result)   Collection Time: 07/27/15  1:30 PM  Result Value Ref Range Status   Specimen Description BLOOD RIGHT ANTECUBITAL  Final   Special Requests BOTTLES DRAWN AEROBIC ONLY 5ML  Final   Culture NO GROWTH < 24 HOURS  Final   Report Status PENDING  Incomplete  Culture, blood (x 2)     Status: None (Preliminary result)   Collection Time: 07/27/15  1:38 PM  Result Value Ref Range Status   Specimen Description BLOOD RIGHT  HAND  Final   Special Requests IN PEDIATRIC BOTTLE 2.5ML  Final   Culture NO GROWTH < 24 HOURS  Final   Report Status PENDING  Incomplete  Surgical pcr screen     Status: None   Collection Time: 07/27/15  8:33 PM  Result Value Ref Range Status   MRSA, PCR NEGATIVE NEGATIVE Final   Staphylococcus aureus NEGATIVE NEGATIVE Final    Comment:        The Xpert SA Assay (FDA approved for NASAL specimens in patients over 72 years of age), is one component of a comprehensive surveillance program.  Test performance has been validated by [facility] for patients greater than or equal to 47 year old. It is not intended to diagnose infection nor to guide or monitor treatment.       Radiology Studies: Dg Chest Port 1 View  07/27/2015  CLINICAL DATA:  Sepsis EXAM: PORTABLE CHEST 1 VIEW COMPARISON:  08/18/2015 FINDINGS: Chronic cardiomegaly and main pulmonary artery enlargement. Interstitial coarsening that is similar to prior, without effusion or Kerley line. No convincing consolidation, limited behind the heart on this portable study. Symmetric biapical pleural thickening. IMPRESSION: No convincing pneumonia. Cardiomegaly and bronchitic or congestive interstitial coarsening.  Electronically Signed   By: Monte Fantasia M.D.   On: 07/27/2015 13:59   Dg Hip Operative Unilat W Or W/o Pelvis Left  07/28/2015  CLINICAL DATA:  Z41.9 (ICD-10-CM) - Surgery, elective Fluoro 24 seconds EXAM: OPERATIVE LEFT HIP (WITH PELVIS IF PERFORMED) 2 VIEWS TECHNIQUE: Fluoroscopic spot image(s) were submitted for interpretation post-operatively. COMPARISON:  07/26/2015 FINDINGS: The patient has undergone left hip hemi arthroplasty. The femoral head component appears well seated in the acetabulum. No interval fracture. IMPRESSION: Status post left hip arthroplasty. Electronically Signed   By: Nolon Nations M.D.   On: 07/28/2015 13:08     Scheduled Meds: . clonazePAM  1 mg Oral QHS  . Cranberry  1 capsule Oral Daily  . docusate sodium  100 mg Oral BID  . feeding supplement (GLUCERNA SHAKE)  237 mL Oral TID BM  . heparin  5,000 Units Subcutaneous Q8H  . insulin aspart  0-5 Units Subcutaneous QHS  . insulin aspart  0-9 Units Subcutaneous TID WC  . [START ON 07/30/2015] levothyroxine  25 mcg Oral QAC breakfast  . [START ON 07/30/2015] loratadine  10 mg Oral q morning - 10a  . piperacillin-tazobactam (ZOSYN)  IV  3.375 g Intravenous Q8H  . sertraline  100 mg Oral Daily  . sertraline  25 mg Oral Daily  . sodium chloride flush  3 mL Intravenous Q12H  . traZODone  100 mg Oral QHS  . [START ON 07/30/2015] verapamil  240 mg Oral Daily   Continuous Infusions: . lactated ringers 50 mL/hr at 07/28/15 1546     Marzetta Board, MD, PhD [facility] Pager [phone]  If 7PM-7AM, please contact night-coverage [email or web address] Password [facility] 07/29/2015, 12:47 PM

## 2015-07-29 NOTE — Progress Notes (Signed)
Family concern about patient not resting- MD paged.

## 2015-07-29 NOTE — NC FL2 (Signed)
[place] LEVEL OF CARE SCREENING TOOL     IDENTIFICATION  Patient Name: [name] [name] Birthdate: September 11, 1929 Sex: female Admission Date (Current Location): 07/26/2015  [address] [facility] and [place] Number:  Herbalist and Address:  [facility], [place] [address], [place], [place] [address]      Provider Number: [id number]  Attending Physician Name and Address:  Caren Griffins, MD  Relative Name and Phone Number:       Current Level of Care: Hospital Recommended Level of Care: [place] Prior Approval Number:    Date Approved/Denied:   PASRR Number: Pending  Discharge Plan: SNF    Current Diagnoses: Patient Active Problem List   Diagnosis Date Noted  . Closed left hip fracture ([place]) 07/26/2015  . T2_NIDDM w/Stage 3 CKD (GFR 54 ml/min) 11/27/2013  . COPD with chronic bronchitis ([place]) 11/27/2013  . Asthma with exacerbation 11/27/2013  . Medication management 06/25/2013  . OBS 05/22/2013  . Vitamin D deficiency   . Dementia   . Depression   . Postinflammatory pulmonary fibrosis ([place]) 04/14/2007  . Hypothyroidism 01/24/2007  . Mixed hyperlipidemia 01/24/2007  . Essential hypertension 01/24/2007  . Asthma 01/24/2007  . GERD 01/24/2007  . Irritable bowel syndrome 01/24/2007    Orientation RESPIRATION BLADDER Height & Weight      (Disoriented X 4)  Normal Continent Weight: 162 lb 14.7 oz (73.9 kg) (Last recorded weight) Height:     BEHAVIORAL SYMPTOMS/MOOD NEUROLOGICAL BOWEL NUTRITION STATUS   (None)  (Dementia) Continent Diet (Heart healthy/carb-modified)  AMBULATORY STATUS COMMUNICATION OF NEEDS Skin   Limited Assist Verbally Surgical wounds (Closed incision left hip)                       Personal Care Assistance Level of Assistance  Bathing, Feeding, Dressing Bathing Assistance: Limited assistance Feeding assistance: Independent Dressing Assistance: Limited assistance     Functional Limitations  Info  Sight, Hearing, Speech Sight Info: Adequate Hearing Info: Adequate Speech Info: Adequate    SPECIAL CARE FACTORS FREQUENCY  Blood pressure, PT (By licensed PT)     PT Frequency: 5 x week              Contractures Contractures Info: Not present    Additional Factors Info  Psychotropic, Code Status, Allergies Code Status Info: DNR Allergies Info: Iron, Meloxicam, Meperidine Hcl, Pravastatin, Zolpidem Tartrate, Codeine Psychotropic Info: Depression         Current Medications (07/29/2015):  This is the current hospital active medication list Current Facility-Administered Medications  Medication Dose Route Frequency Provider Last Rate Last Dose  . acetaminophen (TYLENOL) tablet 650 mg  650 mg Oral Q6H PRN Caren Griffins, MD   650 mg at 07/29/15 0015   Or  . acetaminophen (TYLENOL) suppository 650 mg  650 mg Rectal Q6H PRN Costin Karlyne Greenspan, MD      . bisacodyl (DULCOLAX) suppository 10 mg  10 mg Rectal Daily PRN Costin Karlyne Greenspan, MD      . dexamethasone (DECADRON) injection 10 mg  10 mg Intravenous Once Costin Karlyne Greenspan, MD      . docusate sodium (COLACE) capsule 100 mg  100 mg Oral BID Caren Griffins, MD   100 mg at 07/28/15 1547  . feeding supplement (GLUCERNA SHAKE) (GLUCERNA SHAKE) liquid 237 mL  237 mL Oral TID BM Costin Karlyne Greenspan, MD   237 mL at 07/27/15 1504  . heparin injection 5,000 Units  5,000 Units Subcutaneous Q8H Reubin Milan, MD   5,000 Units at 07/29/15 0557  . insulin aspart (novoLOG) injection 0-5 Units  0-5 Units Subcutaneous QHS Caren Griffins, MD   2 Units at 07/28/15 2234  . insulin aspart (novoLOG) injection 0-9 Units  0-9 Units Subcutaneous TID WC Costin Karlyne Greenspan, MD   5 Units at 07/29/15 0905  . ketorolac (TORADOL) 15 MG/ML injection 15 mg  15 mg Intravenous Q6H PRN Caren Griffins, MD   15 mg at 07/29/15 0855  . lactated ringers infusion   Intravenous Continuous Caren Griffins, MD 50 mL/hr at 07/28/15 1546    . LORazepam (ATIVAN)  injection 0.5 mg  0.5 mg Intravenous Q6H PRN Caren Griffins, MD   0.5 mg at 07/28/15 2100  . metoCLOPramide (REGLAN) tablet 5-10 mg  5-10 mg Oral Q8H PRN Costin Karlyne Greenspan, MD       Or  . metoCLOPramide (REGLAN) injection 5-10 mg  5-10 mg Intravenous Q8H PRN Caren Griffins, MD      . ondansetron [facility]) tablet 4 mg  4 mg Oral Q6H PRN Reubin Milan, MD       Or  . ondansetron [facility]) injection 4 mg  4 mg Intravenous Q6H PRN Reubin Milan, MD   4 mg at 07/28/15 1833  . piperacillin-tazobactam (ZOSYN) IVPB 3.375 g  3.375 g Intravenous Q8H Jake Church Masters, RPH   3.375 g at 07/29/15 0557  . sodium chloride flush (NS) 0.9 % injection 3 mL  3 mL Intravenous Q12H Reubin Milan, MD   3 mL at 07/27/15 1438     Discharge Medications: Please see discharge summary for a list of discharge medications.  Relevant Imaging Results:  Relevant Lab Results:   Additional Information SS#: [id number]  Candie Chroman, LCSW

## 2015-07-30 LAB — BASIC METABOLIC PANEL
Anion gap: 11 (ref 5–15)
BUN: 18 mg/dL (ref 6–20)
CALCIUM: 8.4 mg/dL — AB (ref 8.9–10.3)
CHLORIDE: 102 mmol/L (ref 101–111)
CO2: 24 mmol/L (ref 22–32)
CREATININE: 0.86 mg/dL (ref 0.44–1.00)
GFR calc non Af Amer: >60 mL/min (ref >60)
GLUCOSE: 143 mg/dL — AB (ref 65–99)
Potassium: 3.1 mmol/L — ABNORMAL LOW (ref 3.5–5.1)
Sodium: 137 mmol/L (ref 135–145)

## 2015-07-30 LAB — CBC
HEMATOCRIT: 29.4 % — AB (ref 36.0–46.0)
HEMOGLOBIN: 9.7 g/dL — AB (ref 12.0–15.0)
MCH: 29.2 pg (ref 26.0–34.0)
MCHC: 33 g/dL (ref 30.0–36.0)
MCV: 88.6 fL (ref 78.0–100.0)
Platelets: 198 10*3/uL (ref 150–400)
RBC: 3.32 MIL/uL — ABNORMAL LOW (ref 3.87–5.11)
RDW: 13 % (ref 11.5–15.5)
WBC: 10.1 10*3/uL (ref 4.0–10.5)

## 2015-07-30 LAB — GLUCOSE, CAPILLARY
GLUCOSE-CAPILLARY: 137 mg/dL — AB (ref 65–99)
Glucose-Capillary: 136 mg/dL — ABNORMAL HIGH (ref 65–99)
Glucose-Capillary: 152 mg/dL — ABNORMAL HIGH (ref 65–99)
Glucose-Capillary: 181 mg/dL — ABNORMAL HIGH (ref 65–99)

## 2015-07-30 LAB — C DIFFICILE QUICK SCREEN W PCR REFLEX
C DIFFICILE (CDIFF) INTERP: NEGATIVE
C DIFFICILE (CDIFF) TOXIN: NEGATIVE
C Diff antigen: NEGATIVE

## 2015-07-30 MED ORDER — POTASSIUM CHLORIDE CRYS ER 20 MEQ PO TBCR
40.0000 meq | EXTENDED_RELEASE_TABLET | Freq: Once | ORAL | Status: AC
Start: 2015-07-30 — End: 2015-07-30
  Administered 2015-07-30: 40 meq via ORAL
  Filled 2015-07-30: qty 2

## 2015-07-30 NOTE — Progress Notes (Signed)
[facility] Progress Note Weekend Cross-Coverage  Subjective  Doing ok Nursing reports that pt has not been sleeping well   Review of Systems  Unable to perform ROS: dementia     Objective   BP 117/69 mmHg  Pulse 88  Temp(Src) 98.7 F (37.1 C) (Oral)  Resp 16  Wt 73.9 kg (162 lb 14.7 oz)  SpO2 97%  Intake/Output      06/30 0701 - 07/01 0700 07/01 0701 - 07/02 0700   P.O. 220    I.V. (mL/kg)     Total Intake(mL/kg) 220 (3)    Urine (mL/kg/hr) 175 (0.1)    Stool 0 (0)    Blood     Total Output 175     Net +45          Urine Occurrence 5 x    Stool Occurrence 6 x      Labs  Results for TAISA, DELORIA (MRN [id number]) as of 07/30/2015 10:49  Ref. Range 07/30/2015 07:07  Sodium Latest Ref Range: 135-145 mmol/L 137  Potassium Latest Ref Range: 3.5-5.1 mmol/L 3.1 (L)  Chloride Latest Ref Range: 101-111 mmol/L 102  CO2 Latest Ref Range: 22-32 mmol/L 24  BUN Latest Ref Range: 6-20 mg/dL 18  Creatinine Latest Ref Range: 0.44-1.00 mg/dL 0.86  Calcium Latest Ref Range: 8.9-10.3 mg/dL 8.4 (L)  EGFR (Non-African Amer.) Latest Ref Range: >60 mL/min >60  EGFR (African American) Latest Ref Range: >60 mL/min >60  Glucose Latest Ref Range: 65-99 mg/dL 143 (H)  Anion gap Latest Ref Range: 5-15  11  WBC Latest Ref Range: 4.0-10.5 K/uL 10.1  RBC Latest Ref Range: 3.87-5.11 MIL/uL 3.32 (L)  Hemoglobin Latest Ref Range: 12.0-15.0 g/dL 9.7 (L)  HCT Latest Ref Range: 36.0-46.0 % 29.4 (L)  MCV Latest Ref Range: 78.0-100.0 fL 88.6  MCH Latest Ref Range: 26.0-34.0 pg 29.2  MCHC Latest Ref Range: 30.0-36.0 g/dL 33.0  RDW Latest Ref Range: 11.5-15.5 % 13.0  Platelets Latest Ref Range: 150-400 K/uL 198    Exam  Gen: appears comfortable, NAD  Ext:       Left Lower Extremity   Anterior hip incision stable  Steri strips intact  No active drainage  No swelling or erythema   Distal motor and sensory functions grossly intact  Ext warm   + DP pulse   No significant  swelling distally      Assessment and Plan   POD/HD#: 2   -Left femoral neck fracture s/p L anterior hip hemiarthroplasty   WBAT  PT/OT  Son reports cane use pre-injury but would often times forget it   Dressing changes as needed  Ok to clean wound with soap and water   - Pain management:  Tylenol and ketorolac   - ABL anemia/Hemodynamics  Stable  Cbc in am   - Medical issues   Per primary service  Hypokalemia- defer to internal medicine   - DVT/PE prophylaxis:  Heparin  ASA at dc per surgeon   - Dispo:  Continue with therapies  Stable from ortho standpoint for SNF     Jari Pigg, PA-C [facility] [phone] (P) [phone] (O) 07/30/2015 10:48 AM

## 2015-07-30 NOTE — Progress Notes (Signed)
PROGRESS NOTE  [name] [name] [id number] DOB: 02/17/1929 DOA: 07/26/2015 PCP: Alesia Richards, MD   LOS: 4 days   Brief Narrative: 82 y.o. female with medical history significant of asthma, type 2 diabetes, hyperlipidemia, hypertension, chronic headaches, COPD, pulmonary fibrosis, B12 deficiency, vitamin D deficiency, depression, Alzheimer's dementia, hypothyroidism who is usually ambulatory at the nursing home facility where she lives and comes via EMS to the emergency department due to having a left hip fracture  Assessment & Plan: Principal Problem:   Closed left hip fracture [facility]) Active Problems:   Hypothyroidism   Essential hypertension   Asthma   GERD   Dementia  Closed left hip fracture [facility]) - Orthopedic surgery consulted, appreciate input. She is s/p anterior approach hemi hip arthroplasty 6/29 - PT to eval  - OK for DC from ortho standpoint  Dementia with behavioral disturbances - Supportive care, on her home medications however patient still very agitated, sitter required last night overnight - continue clonazepam, zoloft, trazodone. May need haldol soon but risky not to oversedate  Diarrhea - patient with 3 episodes of diarrhea today, possibly due to Zosyn - laxative not given today   Acute encephalopathy / febrile episode 6/28 - Patient with progressive lethargy 6/28, rapid response called, had a febrile episode and antibiotics were started out of concern for early sepsis - blood cultures remained negative, urine culture negative, no obvious source was identified, she is afebrile and without leukocytosis - stop Zosyn today and carefully monitor  LBBB  - 2D echo done 6/28, normal EF, no WMA, grade 1 DD - cardiology evaluated pre-op  Hypothyroidism - on levothyroxine   Essential hypertension - continue current management. BP 117/69  Asthma - Asymptomatic at this time. - Bronchodilators as needed.  GERD   DVT prophylaxis: heparin Code  Status: DNR Family Communication: d/w son and multiple other family members bedside Disposition Plan: TBD  Consultants:   Orthopedic surgery  Cardiology  Procedures:   2D echo  Foley  Antimicrobials:  Zosyn 6/28 >>  Vancomycin 6/28 >> 6/29    Subjective: - agitated, trying to get up from bed  Objective: Filed Vitals:   07/29/15 1026 07/29/15 1500 07/29/15 2006 07/30/15 0442  BP:  136/60 112/95 117/69  Pulse:  100 89 88  Temp:  97.1 F (36.2 C) 98.2 F (36.8 C) 98.7 F (37.1 C)  TempSrc:  Axillary Axillary Oral  Resp:  16 16 16   Weight: 73.9 kg (162 lb 14.7 oz)     SpO2:  95% 95% 97%    Intake/Output Summary (Last 24 hours) at 07/30/15 1310 Last data filed at 07/29/15 1354  Gross per 24 hour  Intake    100 ml  Output      0 ml  Net    100 ml   Filed Weights   07/29/15 1026  Weight: 73.9 kg (162 lb 14.7 oz)    Examination: Constitutional: agitated, yelling  Filed Vitals:   07/29/15 1026 07/29/15 1500 07/29/15 2006 07/30/15 0442  BP:  136/60 112/95 117/69  Pulse:  100 89 88  Temp:  97.1 F (36.2 C) 98.2 F (36.8 C) 98.7 F (37.1 C)  TempSrc:  Axillary Axillary Oral  Resp:  16 16 16   Weight: 73.9 kg (162 lb 14.7 oz)     SpO2:  95% 95% 97%   Eyes: PERRL Respiratory:  No crackles, no wheezing. No accessory muscle use.  Cardiovascular: Regular rate and rhythm, no murmurs / rubs / gallops. Trace LE edema. 2+  pedal pulses. Abdomen: no tenderness. Bowel sounds positive.  Musculoskeletal: no clubbing / cyanosis.  Skin: no rashes, lesions, ulcers. No induration Neurologic: non focal    Data Reviewed: I have personally reviewed following labs and imaging studies  CBC:  Recent Labs Lab 07/26/15 2001 07/27/15 0740 07/27/15 1322 07/28/15 0356 07/29/15 0658 07/30/15 0707  WBC 14.1* 13.0* 12.0* 8.4 8.7 10.1  NEUTROABS 11.9* 11.4* 10.4* 6.5 7.2  --   HGB 12.2 11.5* 12.2 10.3* 10.3* 9.7*  HCT 35.7* 34.1* 36.8 32.1* 31.0* 29.4*  MCV 87.3 86.3  89.1 89.9 90.9 88.6  PLT 226 196 209 194 178 [id number]   Basic Metabolic Panel:  Recent Labs Lab 07/27/15 0740 07/27/15 1322 07/28/15 0356 07/29/15 0658 07/30/15 0707  NA 137 137 136 137 137  K 3.4* 3.3* 3.3* 3.3* 3.1*  CL 106 103 104 105 102  CO2 23 26 23 22 24   GLUCOSE 174* 163* 124* 169* 143*  BUN 16 15 23* 24* 18  CREATININE 0.72 0.76 0.85 1.01* 0.86  CALCIUM 9.0 8.9 8.5* 8.1* 8.4*   GFR: Estimated Creatinine Clearance: 50 mL/min (by C-G formula based on Cr of 0.86). Liver Function Tests:  Recent Labs Lab 07/26/15 2001 07/27/15 0740 07/27/15 1322 07/28/15 0356 07/29/15 0658  AST 38 33 33 54* 78*  ALT 34 31 32 53 53  ALKPHOS 95 87 105 89 155*  BILITOT 0.9 0.7 0.9 0.8 0.9  PROT 6.7 6.0* 6.5 5.4* 5.4*  ALBUMIN 3.6 3.2* 3.3* 2.7* 2.6*   No results for input(s): LIPASE, AMYLASE in the last 168 hours. No results for input(s): AMMONIA in the last 168 hours. Coagulation Profile:  Recent Labs Lab 07/26/15 2001 07/27/15 1322  INR 1.13 1.06   Cardiac Enzymes: No results for input(s): CKTOTAL, CKMB, CKMBINDEX, TROPONINI in the last 168 hours. BNP (last 3 results) No results for input(s): PROBNP in the last 8760 hours. HbA1C: No results for input(s): HGBA1C in the last 72 hours. CBG:  Recent Labs Lab 07/29/15 0903 07/29/15 1239 07/29/15 2159 07/30/15 0612 07/30/15 1123  GLUCAP 251* 151* 359* 136* 152*   Lipid Profile: No results for input(s): CHOL, HDL, LDLCALC, TRIG, CHOLHDL, LDLDIRECT in the last 72 hours. Thyroid Function Tests:  Recent Labs  07/27/15 1322  TSH 2.624   Anemia Panel: No results for input(s): VITAMINB12, FOLATE, FERRITIN, TIBC, IRON, RETICCTPCT in the last 72 hours. Urine analysis:    Component Value Date/Time   COLORURINE YELLOW 07/26/2015 2114   APPEARANCEUR CLEAR 07/26/2015 2114   LABSPEC 1.018 07/26/2015 2114   PHURINE 7.5 07/26/2015 2114   GLUCOSEU 500* 07/26/2015 2114   HGBUR NEGATIVE 07/26/2015 2114   BILIRUBINUR NEGATIVE  07/26/2015 2114   KETONESUR 15* 07/26/2015 2114   PROTEINUR 100* 07/26/2015 2114   UROBILINOGEN 0.2 08/04/2014 1544   NITRITE NEGATIVE 07/26/2015 2114   LEUKOCYTESUR NEGATIVE 07/26/2015 2114   Sepsis Labs: Invalid input(s): PROCALCITONIN, LACTICIDVEN  Recent Results (from the past 240 hour(s))  Urine culture     Status: None   Collection Time: 07/26/15  9:14 PM  Result Value Ref Range Status   Specimen Description URINE, CATHETERIZED  Final   Special Requests NONE  Final   Culture NO GROWTH  Final   Report Status 07/28/2015 FINAL  Final  Culture, blood (x 2)     Status: None (Preliminary result)   Collection Time: 07/27/15  1:30 PM  Result Value Ref Range Status   Specimen Description BLOOD RIGHT ANTECUBITAL  Final   Special Requests BOTTLES  DRAWN AEROBIC ONLY 5ML  Final   Culture NO GROWTH 2 DAYS  Final   Report Status PENDING  Incomplete  Culture, blood (x 2)     Status: None (Preliminary result)   Collection Time: 07/27/15  1:38 PM  Result Value Ref Range Status   Specimen Description BLOOD RIGHT HAND  Final   Special Requests IN PEDIATRIC BOTTLE 2.5ML  Final   Culture NO GROWTH 2 DAYS  Final   Report Status PENDING  Incomplete  Surgical pcr screen     Status: None   Collection Time: 07/27/15  8:33 PM  Result Value Ref Range Status   MRSA, PCR NEGATIVE NEGATIVE Final   Staphylococcus aureus NEGATIVE NEGATIVE Final    Comment:        The Xpert SA Assay (FDA approved for NASAL specimens in patients over 95 years of age), is one component of a comprehensive surveillance program.  Test performance has been validated by [facility] for patients greater than or equal to 46 year old. It is not intended to diagnose infection nor to guide or monitor treatment.       Radiology Studies: No results found.   Scheduled Meds: . clonazePAM  1 mg Oral QHS  . feeding supplement (GLUCERNA SHAKE)  237 mL Oral TID BM  . heparin  5,000 Units Subcutaneous Q8H  . insulin  aspart  0-5 Units Subcutaneous QHS  . insulin aspart  0-9 Units Subcutaneous TID WC  . levothyroxine  25 mcg Oral QAC breakfast  . loratadine  10 mg Oral q morning - 10a  . sertraline  125 mg Oral Daily  . sodium chloride flush  3 mL Intravenous Q12H  . traZODone  100 mg Oral QHS  . verapamil  240 mg Oral Daily   Continuous Infusions: . lactated ringers 50 mL/hr at 07/28/15 1546     Marzetta Board, MD, PhD [facility] Pager [phone]  If 7PM-7AM, please contact night-coverage [email or web address] Password [email or web address] 07/30/2015, 1:10 PM

## 2015-07-30 NOTE — Progress Notes (Signed)
Pharmacy Antibiotic Note  [name] [name] is a 82 y.o. female admitted on 07/26/2015 with encephalopathy. Ortho involved due to closed L hip fracture. Pharmacy consulted for Zosyn dosing.  Plan: -Zosyn 3.375 g IV q8h -Monitor renal fx, cultures, duration of therapy  Weight: 162 lb 14.7 oz (73.9 kg) (Last recorded weight)  Temp (24hrs), Avg:98 F (36.7 C), Min:97.1 F (36.2 C), Max:98.7 F (37.1 C)   Recent Labs Lab 07/27/15 0740 07/27/15 1322 07/27/15 1648 07/28/15 0356 07/29/15 0658 07/30/15 0707  WBC 13.0* 12.0*  --  8.4 8.7 10.1  CREATININE 0.72 0.76  --  0.85 1.01* 0.86  LATICACIDVEN  --  1.5 1.1  --   --   --     Estimated Creatinine Clearance: 50 mL/min (by C-G formula based on Cr of 0.86).    Allergies  Allergen Reactions  . Iron     GI  . Meloxicam Other (See Comments)    Abd pain  . Meperidine Hcl Other (See Comments)    agitated  . Pravastatin     myalgias  . Zolpidem Tartrate Other (See Comments)    confusion  . Codeine Palpitations    "heart raced"    Antimicrobials this admission: vanc 6/28>> 6/29 zosyn 6/28>>  Dose adjustments this admission: NA  Microbiology results: 6/27 blood cx: ngtd 6/27 urine cx: neg 6/28 MRSA PCR: neg  Thank you for allowing pharmacy to be a part of this patient's care.  Saige Busby D. Gwynevere Lizana, PharmD, BCPS Clinical Pharmacist Pager: [phone] 07/30/2015 10:50 AM

## 2015-07-31 LAB — BASIC METABOLIC PANEL
ANION GAP: 7 (ref 5–15)
BUN: 20 mg/dL (ref 6–20)
CHLORIDE: 106 mmol/L (ref 101–111)
CO2: 23 mmol/L (ref 22–32)
Calcium: 8 mg/dL — ABNORMAL LOW (ref 8.9–10.3)
Creatinine, Ser: 0.77 mg/dL (ref 0.44–1.00)
GFR calc non Af Amer: >60 mL/min (ref >60)
GLUCOSE: 152 mg/dL — AB (ref 65–99)
Potassium: 3.4 mmol/L — ABNORMAL LOW (ref 3.5–5.1)
Sodium: 136 mmol/L (ref 135–145)

## 2015-07-31 LAB — CBC
HEMATOCRIT: 25.8 % — AB (ref 36.0–46.0)
HEMOGLOBIN: 8.5 g/dL — AB (ref 12.0–15.0)
MCH: 29.2 pg (ref 26.0–34.0)
MCHC: 32.9 g/dL (ref 30.0–36.0)
MCV: 88.7 fL (ref 78.0–100.0)
Platelets: 211 10*3/uL (ref 150–400)
RBC: 2.91 MIL/uL — ABNORMAL LOW (ref 3.87–5.11)
RDW: 13.2 % (ref 11.5–15.5)
WBC: 9.6 10*3/uL (ref 4.0–10.5)

## 2015-07-31 LAB — GLUCOSE, CAPILLARY
GLUCOSE-CAPILLARY: 148 mg/dL — AB (ref 65–99)
GLUCOSE-CAPILLARY: 115 mg/dL — AB (ref 65–99)
Glucose-Capillary: 149 mg/dL — ABNORMAL HIGH (ref 65–99)
Glucose-Capillary: 143 mg/dL — ABNORMAL HIGH (ref 65–99)

## 2015-07-31 MED ORDER — LOPERAMIDE HCL 2 MG PO CAPS
2.0000 mg | ORAL_CAPSULE | Freq: Once | ORAL | Status: AC
  Administered 2015-07-31: 2 mg via ORAL
  Filled 2015-07-31: qty 1

## 2015-07-31 MED ORDER — ACETAMINOPHEN 325 MG PO TABS: 650.0000 mg | ORAL_TABLET | Freq: Four times a day (QID) | ORAL | Status: AC | PRN

## 2015-07-31 NOTE — Progress Notes (Signed)
Orthopaedic Trauma Service Progress Note  Subjective  No acute ortho events Family concerned about lack of sitter at SNF Pt needs to be monitored due to dementia   ROS Sleeping   Objective   BP 155/62 mmHg  Pulse 93  Temp(Src) 98.6 F (37 C) (Axillary)  Resp 18  Wt 73.9 kg (162 lb 14.7 oz)  SpO2 92%  Intake/Output      07/01 0701 - 07/02 0700 07/02 0701 - 07/03 0700   P.O. 420    Total Intake(mL/kg) 420 (5.7)    Urine (mL/kg/hr)     Stool     Total Output       Net +420          Urine Occurrence 4 x 1 x   Stool Occurrence 8 x 1 x     Labs  Results for ALLEIGH, MOLLICA (MRN [id number]) as of 07/31/2015 12:35  Ref. Range 07/31/2015 04:43  Sodium Latest Ref Range: 135-145 mmol/L 136  Potassium Latest Ref Range: 3.5-5.1 mmol/L 3.4 (L)  Chloride Latest Ref Range: 101-111 mmol/L 106  CO2 Latest Ref Range: 22-32 mmol/L 23  BUN Latest Ref Range: 6-20 mg/dL 20  Creatinine Latest Ref Range: 0.44-1.00 mg/dL 0.77  Calcium Latest Ref Range: 8.9-10.3 mg/dL 8.0 (L)  EGFR (Non-African Amer.) Latest Ref Range: >60 mL/min >60  EGFR (African American) Latest Ref Range: >60 mL/min >60  Glucose Latest Ref Range: 65-99 mg/dL 152 (H)  Anion gap Latest Ref Range: 5-15  7  WBC Latest Ref Range: 4.0-10.5 K/uL 9.6  RBC Latest Ref Range: 3.87-5.11 MIL/uL 2.91 (L)  Hemoglobin Latest Ref Range: 12.0-15.0 g/dL 8.5 (L)  HCT Latest Ref Range: 36.0-46.0 % 25.8 (L)  MCV Latest Ref Range: 78.0-100.0 fL 88.7  MCH Latest Ref Range: 26.0-34.0 pg 29.2  MCHC Latest Ref Range: 30.0-36.0 g/dL 32.9  RDW Latest Ref Range: 11.5-15.5 % 13.2  Platelets Latest Ref Range: 150-400 K/uL 211    Exam  Gen: resting comfortably, NAD  Ext:       Left Lower Extremity   Dressing c/d/i  Ext warm  Swelling stable    Assessment and Plan   POD/HD#: 73  82 y/o female s/p fall  -Left femoral neck fracture s/p L anterior hip hemiarthroplasty               WBAT             PT/OT             Son reports cane  use pre-injury but would often times forget it               Dressing changes as needed             Ok to clean wound with soap and water   - Pain management:             Tylenol and ketorolac   - ABL anemia/Hemodynamics             H/H trending down             Cbc in am   - Medical issues               Per primary service             Hypokalemia- improved   - DVT/PE prophylaxis:             Heparin as inpatient  ASA at dc per surgeon    - Dispo:             Continue with therapies             Stable from ortho standpoint for SNF      Jari Pigg, PA-C [facility] [phone] (O) 07/31/2015 12:34 PM

## 2015-07-31 NOTE — Progress Notes (Signed)
PROGRESS NOTE  [name] [name] [id number] DOB: 1929/09/12 DOA: 07/26/2015 PCP: Alesia Richards, MD   LOS: 5 days   Brief Narrative: 82 y.o. female with medical history significant of asthma, type 2 diabetes, hyperlipidemia, hypertension, chronic headaches, COPD, pulmonary fibrosis, B12 deficiency, vitamin D deficiency, depression, Alzheimer's dementia, hypothyroidism who is usually ambulatory at the nursing home facility where she lives and comes via EMS to the emergency department due to having a left hip fracture  Assessment & Plan: Principal Problem:   Closed left [facility] fracture [facility]) Active Problems:   Hypothyroidism   Essential hypertension   Asthma   GERD   Dementia   Closed left hip fracture [facility]) - Orthopedic surgery consulted, appreciate input. She is s/p anterior approach hemi hip arthroplasty 6/29 - PT to eval  - OK for DC from ortho standpoint. Can be on po Aspirin on d/c instead of Lovenox per Dr. Percell Miller  Dementia with behavioral disturbances - Supportive care, on her home medications however patient still very agitated, sitter required last night overnight - continue clonazepam, zoloft, trazodone. May need haldol but risky not to oversedate   Patient continued to require a sitter overnight. Discussed bedside with multiple family members. They are concerned about discontinuation of the sitter in preparation for d/c. She has been in a memory unit and has been a danger to herself in the past and wandering off, and concerned that if not having a sitter is a requirement for SNF placement what would happen if she is becoming agitated overnight in the SNF and whether they will have the resources available to manage patient's needs. Talked with SW, she will inquire whether [name] truly aware of the fact that she resided in a memory unit. Also would be beneficial to see if she could potentially go back to her memory unit if they can handle her medical aspects of her  care in the setting of new hip surgery. Per patient's son, apparently they cannot handle Lovenox / heparin injections, however I discussed with Dr. Percell Miller and potentially she can be on Aspirin BID instead. Greatly appreciate SW involvement  Diarrhea - patient with 3 episodes of diarrhea 7/1, possibly due to Zosyn, discontinued since cultures negative - imodium x 1 today. C diff negative  Acute encephalopathy / febrile episode 6/28 - Patient with progressive lethargy 6/28, rapid response called, had a febrile episode and antibiotics were started out of concern for early sepsis - blood cultures remained negative, urine culture negative, no obvious source was identified, she is afebrile and without leukocytosis - stopped Zosyn 7/1, afebrile   LBBB  - 2D echo done 6/28, normal EF, no WMA, grade 1 DD - cardiology evaluated pre-op  Hypothyroidism - on levothyroxine   Essential hypertension - continue current management. BP 117/69  Asthma - Asymptomatic at this time. - Bronchodilators as needed.  GERD  ABLA - Hb downtrending 12.2 >> 10.3 >> 9.7 >> 8.5. Continue to monitor.    DVT prophylaxis: heparin per orthopedic surgery Code Status: DNR Family Communication: d/w son and multiple other family members bedside Disposition Plan: TBD  Consultants:   Orthopedic surgery  Cardiology  Procedures:   2D echo  anterior approach hemi hip arthroplasty 6/29  Antimicrobials:  Zosyn 6/28 >> 7/1  Vancomycin 6/28 >> 6/29    Subjective: - sleeping this morning, confused  Objective: Filed Vitals:   07/30/15 0442 07/30/15 1400 07/30/15 1952 07/31/15 0455  BP: 117/69 116/52 141/63 155/62  Pulse: 88 77 83 93  Temp: 98.7 F (37.1 C) 99.1 F (37.3 C) 98.4 F (36.9 C) 98.6 F (37 C)  TempSrc: Oral Oral Axillary Axillary  Resp: 16 18 18 18   Weight:      SpO2: 97% 97% 90% 92%    Intake/Output Summary (Last 24 hours) at 07/31/15 1114 Last data filed at 07/30/15 1700  Gross  per 24 hour  Intake    360 ml  Output      0 ml  Net    360 ml   Filed Weights   07/29/15 1026  Weight: 73.9 kg (162 lb 14.7 oz)    Examination: Constitutional: agitated, yelling  Filed Vitals:   07/30/15 0442 07/30/15 1400 07/30/15 1952 07/31/15 0455  BP: 117/69 116/52 141/63 155/62  Pulse: 88 77 83 93  Temp: 98.7 F (37.1 C) 99.1 F (37.3 C) 98.4 F (36.9 C) 98.6 F (37 C)  TempSrc: Oral Oral Axillary Axillary  Resp: 16 18 18 18   Weight:      SpO2: 97% 97% 90% 92%   Eyes: PERRL Respiratory:  No crackles, no wheezing. No accessory muscle use.  Cardiovascular: Regular rate and rhythm, no murmurs / rubs / gallops. Trace LE edema. 2+ pedal pulses. Abdomen: no tenderness. Bowel sounds positive.  Musculoskeletal: no clubbing / cyanosis.  Skin: no rashes, lesions, ulcers. No induration Neurologic: non focal    Data Reviewed: I have personally reviewed following labs and imaging studies  CBC:  Recent Labs Lab 07/26/15 2001 07/27/15 0740 07/27/15 1322 07/28/15 0356 07/29/15 0658 07/30/15 0707 07/31/15 0443  WBC 14.1* 13.0* 12.0* 8.4 8.7 10.1 9.6  NEUTROABS 11.9* 11.4* 10.4* 6.5 7.2  --   --   HGB 12.2 11.5* 12.2 10.3* 10.3* 9.7* 8.5*  HCT 35.7* 34.1* 36.8 32.1* 31.0* 29.4* 25.8*  MCV 87.3 86.3 89.1 89.9 90.9 88.6 88.7  PLT 226 196 209 194 178 198 [id number]   Basic Metabolic Panel:  Recent Labs Lab 07/27/15 1322 07/28/15 0356 07/29/15 0658 07/30/15 0707 07/31/15 0443  NA 137 136 137 137 136  K 3.3* 3.3* 3.3* 3.1* 3.4*  CL 103 104 105 102 106  CO2 26 23 22 24 23   GLUCOSE 163* 124* 169* 143* 152*  BUN 15 23* 24* 18 20  CREATININE 0.76 0.85 1.01* 0.86 0.77  CALCIUM 8.9 8.5* 8.1* 8.4* 8.0*   GFR: Estimated Creatinine Clearance: 53.8 mL/min (by C-G formula based on Cr of 0.77). Liver Function Tests:  Recent Labs Lab 07/26/15 2001 07/27/15 0740 07/27/15 1322 07/28/15 0356 07/29/15 0658  AST 38 33 33 54* 78*  ALT 34 31 32 53 53  ALKPHOS 95 87 105 89  155*  BILITOT 0.9 0.7 0.9 0.8 0.9  PROT 6.7 6.0* 6.5 5.4* 5.4*  ALBUMIN 3.6 3.2* 3.3* 2.7* 2.6*   Coagulation Profile:  Recent Labs Lab 07/26/15 2001 07/27/15 1322  INR 1.13 1.06   CBG:  Recent Labs Lab 07/30/15 0612 07/30/15 1123 07/30/15 1633 07/30/15 2049 07/31/15 0628  GLUCAP 136* 152* 137* 181* 149*   Urine analysis:    Component Value Date/Time   COLORURINE YELLOW 07/26/2015 2114   APPEARANCEUR CLEAR 07/26/2015 2114   LABSPEC 1.018 07/26/2015 2114   PHURINE 7.5 07/26/2015 2114   GLUCOSEU 500* 07/26/2015 2114   HGBUR NEGATIVE 07/26/2015 2114   BILIRUBINUR NEGATIVE 07/26/2015 2114   KETONESUR 15* 07/26/2015 2114   PROTEINUR 100* 07/26/2015 2114   UROBILINOGEN 0.2 08/04/2014 1544   NITRITE NEGATIVE 07/26/2015 2114   LEUKOCYTESUR NEGATIVE 07/26/2015 2114   Sepsis  Labs: Invalid input(s): PROCALCITONIN, LACTICIDVEN  Recent Results (from the past 240 hour(s))  Urine culture     Status: None   Collection Time: 07/26/15  9:14 PM  Result Value Ref Range Status   Specimen Description URINE, CATHETERIZED  Final   Special Requests NONE  Final   Culture NO GROWTH  Final   Report Status 07/28/2015 FINAL  Final  Culture, blood (x 2)     Status: None (Preliminary result)   Collection Time: 07/27/15  1:30 PM  Result Value Ref Range Status   Specimen Description BLOOD RIGHT ANTECUBITAL  Final   Special Requests BOTTLES DRAWN AEROBIC ONLY 5ML  Final   Culture NO GROWTH 3 DAYS  Final   Report Status PENDING  Incomplete  Culture, blood (x 2)     Status: None (Preliminary result)   Collection Time: 07/27/15  1:38 PM  Result Value Ref Range Status   Specimen Description BLOOD RIGHT HAND  Final   Special Requests IN PEDIATRIC BOTTLE 2.5ML  Final   Culture NO GROWTH 3 DAYS  Final   Report Status PENDING  Incomplete  Surgical pcr screen     Status: None   Collection Time: 07/27/15  8:33 PM  Result Value Ref Range Status   MRSA, PCR NEGATIVE NEGATIVE Final    Staphylococcus aureus NEGATIVE NEGATIVE Final    Comment:        The Xpert SA Assay (FDA approved for NASAL specimens in patients over 59 years of age), is one component of a comprehensive surveillance program.  Test performance has been validated by [facility] for patients greater than or equal to 95 year old. It is not intended to diagnose infection nor to guide or monitor treatment.   C difficile quick scan w PCR reflex     Status: None   Collection Time: 07/30/15 12:11 PM  Result Value Ref Range Status   C Diff antigen NEGATIVE NEGATIVE Final   C Diff toxin NEGATIVE NEGATIVE Final   C Diff interpretation Negative for toxigenic C. difficile  Final      Radiology Studies: No results found.   Scheduled Meds: . clonazePAM  1 mg Oral QHS  . feeding supplement (GLUCERNA SHAKE)  237 mL Oral TID BM  . heparin  5,000 Units Subcutaneous Q8H  . insulin aspart  0-5 Units Subcutaneous QHS  . insulin aspart  0-9 Units Subcutaneous TID WC  . levothyroxine  25 mcg Oral QAC breakfast  . loratadine  10 mg Oral q morning - 10a  . sertraline  125 mg Oral Daily  . sodium chloride flush  3 mL Intravenous Q12H  . traZODone  100 mg Oral QHS  . verapamil  240 mg Oral Daily   Continuous Infusions:     Marzetta Board, MD, PhD [facility] Pager [phone]  If 7PM-7AM, please contact night-coverage [email or web address] Password [email or web address] 07/31/2015, 11:14 AM

## 2015-08-01 ENCOUNTER — Other Ambulatory Visit: Payer: Self-pay | Admitting: Internal Medicine

## 2015-08-01 ENCOUNTER — Encounter (HOSPITAL_COMMUNITY): Payer: Self-pay | Admitting: Orthopedic Surgery

## 2015-08-01 DIAGNOSIS — F0391 Unspecified dementia with behavioral disturbance: Secondary | ICD-10-CM | POA: Diagnosis not present

## 2015-08-01 DIAGNOSIS — R278 Other lack of coordination: Secondary | ICD-10-CM | POA: Diagnosis not present

## 2015-08-01 DIAGNOSIS — R2681 Unsteadiness on feet: Secondary | ICD-10-CM | POA: Diagnosis not present

## 2015-08-01 DIAGNOSIS — E559 Vitamin D deficiency, unspecified: Secondary | ICD-10-CM | POA: Diagnosis not present

## 2015-08-01 DIAGNOSIS — E039 Hypothyroidism, unspecified: Secondary | ICD-10-CM | POA: Diagnosis not present

## 2015-08-01 DIAGNOSIS — I447 Left bundle-branch block, unspecified: Secondary | ICD-10-CM | POA: Diagnosis not present

## 2015-08-01 DIAGNOSIS — D62 Acute posthemorrhagic anemia: Secondary | ICD-10-CM | POA: Diagnosis not present

## 2015-08-01 DIAGNOSIS — E119 Type 2 diabetes mellitus without complications: Secondary | ICD-10-CM | POA: Diagnosis not present

## 2015-08-01 DIAGNOSIS — R1312 Dysphagia, oropharyngeal phase: Secondary | ICD-10-CM | POA: Diagnosis not present

## 2015-08-01 DIAGNOSIS — E538 Deficiency of other specified B group vitamins: Secondary | ICD-10-CM | POA: Diagnosis not present

## 2015-08-01 DIAGNOSIS — E46 Unspecified protein-calorie malnutrition: Secondary | ICD-10-CM | POA: Diagnosis not present

## 2015-08-01 DIAGNOSIS — R410 Disorientation, unspecified: Secondary | ICD-10-CM | POA: Diagnosis not present

## 2015-08-01 DIAGNOSIS — S72002S Fracture of unspecified part of neck of left femur, sequela: Secondary | ICD-10-CM | POA: Diagnosis not present

## 2015-08-01 DIAGNOSIS — Z471 Aftercare following joint replacement surgery: Secondary | ICD-10-CM | POA: Diagnosis not present

## 2015-08-01 DIAGNOSIS — S72009A Fracture of unspecified part of neck of unspecified femur, initial encounter for closed fracture: Secondary | ICD-10-CM | POA: Diagnosis not present

## 2015-08-01 DIAGNOSIS — R5381 Other malaise: Secondary | ICD-10-CM | POA: Diagnosis not present

## 2015-08-01 DIAGNOSIS — J45909 Unspecified asthma, uncomplicated: Secondary | ICD-10-CM

## 2015-08-01 DIAGNOSIS — K219 Gastro-esophageal reflux disease without esophagitis: Secondary | ICD-10-CM | POA: Diagnosis not present

## 2015-08-01 DIAGNOSIS — S72001D Fracture of unspecified part of neck of right femur, subsequent encounter for closed fracture with routine healing: Secondary | ICD-10-CM | POA: Diagnosis not present

## 2015-08-01 DIAGNOSIS — S72002D Fracture of unspecified part of neck of left femur, subsequent encounter for closed fracture with routine healing: Secondary | ICD-10-CM

## 2015-08-01 DIAGNOSIS — M6281 Muscle weakness (generalized): Secondary | ICD-10-CM | POA: Diagnosis not present

## 2015-08-01 DIAGNOSIS — Z79899 Other long term (current) drug therapy: Secondary | ICD-10-CM | POA: Diagnosis not present

## 2015-08-01 DIAGNOSIS — Z96642 Presence of left artificial hip joint: Secondary | ICD-10-CM | POA: Diagnosis not present

## 2015-08-01 DIAGNOSIS — E785 Hyperlipidemia, unspecified: Secondary | ICD-10-CM | POA: Diagnosis not present

## 2015-08-01 DIAGNOSIS — G9340 Encephalopathy, unspecified: Secondary | ICD-10-CM | POA: Diagnosis not present

## 2015-08-01 DIAGNOSIS — J449 Chronic obstructive pulmonary disease, unspecified: Secondary | ICD-10-CM | POA: Diagnosis not present

## 2015-08-01 DIAGNOSIS — E876 Hypokalemia: Secondary | ICD-10-CM | POA: Diagnosis not present

## 2015-08-01 DIAGNOSIS — F411 Generalized anxiety disorder: Secondary | ICD-10-CM | POA: Diagnosis not present

## 2015-08-01 DIAGNOSIS — F329 Major depressive disorder, single episode, unspecified: Secondary | ICD-10-CM | POA: Diagnosis not present

## 2015-08-01 DIAGNOSIS — J8410 Pulmonary fibrosis, unspecified: Secondary | ICD-10-CM | POA: Diagnosis not present

## 2015-08-01 DIAGNOSIS — I10 Essential (primary) hypertension: Secondary | ICD-10-CM | POA: Diagnosis not present

## 2015-08-01 LAB — CULTURE, BLOOD (ROUTINE X 2)
CULTURE: NO GROWTH
CULTURE: NO GROWTH

## 2015-08-01 LAB — CBC
HCT: 26.1 % — ABNORMAL LOW (ref 36.0–46.0)
Hemoglobin: 8.5 g/dL — ABNORMAL LOW (ref 12.0–15.0)
MCH: 29.4 pg (ref 26.0–34.0)
MCHC: 32.6 g/dL (ref 30.0–36.0)
MCV: 90.3 fL (ref 78.0–100.0)
PLATELETS: 262 10*3/uL (ref 150–400)
RBC: 2.89 MIL/uL — AB (ref 3.87–5.11)
RDW: 13.3 % (ref 11.5–15.5)
WBC: 7.7 10*3/uL (ref 4.0–10.5)

## 2015-08-01 LAB — GLUCOSE, CAPILLARY
GLUCOSE-CAPILLARY: 152 mg/dL — AB (ref 65–99)
Glucose-Capillary: 140 mg/dL — ABNORMAL HIGH (ref 65–99)

## 2015-08-01 MED ORDER — CLONAZEPAM 1 MG PO TABS: 1.0000 mg | ORAL_TABLET | Freq: Every day | ORAL | Status: AC

## 2015-08-01 MED ORDER — CLONAZEPAM 0.5 MG PO TABS: 0.2500 mg | ORAL_TABLET | Freq: Every day | ORAL | Status: AC | PRN

## 2015-08-01 NOTE — Clinical Social Work Placement (Signed)
   CLINICAL SOCIAL WORK PLACEMENT  NOTE  Date:  08/01/2015  Patient Details  Name: [name] [name] MRN: [id number] Date of Birth: December 10, 1929  Clinical Social Work is seeking post-discharge placement for this patient at the [place] level of care (*CSW will initial, date and re-position this form in  chart as items are completed):  Yes   Patient/family provided with [facility] Work Department's list of facilities offering this level of care within the geographic area requested by the patient (or if unable, by the patient's family).  Yes   Patient/family informed of their freedom to choose among providers that offer the needed level of care, that participate in Medicare, Medicaid or managed care program needed by the patient, have an available bed and are willing to accept the patient.  Yes   Patient/family informed of [facility]'s ownership interest in [facility] and [facility], as well as of the fact that they are under no obligation to receive care at these facilities.  PASRR submitted to EDS on 07/29/15     PASRR number received on       Existing PASRR number confirmed on       FL2 transmitted to all facilities in geographic area requested by pt/family on 07/29/15     FL2 transmitted to all facilities within larger geographic area on       Patient informed that his/her managed care company has contracts with or will negotiate with certain facilities, including the following:        Yes   Patient/family informed of bed offers received.  Patient chooses bed at [facility]     Physician recommends and patient chooses bed at      Patient to be transferred to [facility] on 08/01/15.  Patient to be transferred to facility by [facility]     Patient family notified on 08/01/15 of transfer.  Name of family member notified:  [name], son     PHYSICIAN Please sign FL2, Please sign DNR     Additional Comment:     _______________________________________________ Caroline Sauger, LCSW 08/01/2015, 11:54 AM

## 2015-08-01 NOTE — Clinical Social Work Note (Signed)
PASARR: [id number] Claudean Severance, [facility]: [phone] Surgical: [phone]

## 2015-08-01 NOTE — Progress Notes (Signed)
Called to give report to a nurse Orlene Och at [facility]

## 2015-08-01 NOTE — Clinical Social Work Note (Signed)
Patient to be discharged to [facility]. Patient's son, [name], updated regarding discharge. Patient to be transported via EMS. RN report number: [phone]  Per patient's son, patient's family will provide safety support for patient at [name] until the need arises for family to pay privately for a Air cabin crew.  Lubertha Sayres, [facility]: [phone] Surgical: [phone]

## 2015-08-01 NOTE — Care Management Important Message (Signed)
Important Message  Patient Details  Name: [name] [name] MRN: [id number] Date of Birth: 1929-10-04   Medicare Important Message Given:  Yes    Tytionna Cloyd Abena 08/01/2015, 11:28 AM

## 2015-08-01 NOTE — Progress Notes (Signed)
[name] [name] the social worker called regarding pt going to [facility] today at 2:30 family notified

## 2015-08-01 NOTE — Discharge Summary (Signed)
Physician Discharge Summary  [name] [id number] DOB: 1929-11-28 DOA: 07/26/2015  PCP: Alesia Richards, MD  Admit date: 07/26/2015 Discharge date: 08/01/2015  Admitted From: memory unit [place] Disposition:  SNF [facility]  Recommendations for Outpatient Follow-up:  1. Follow up with orthopedic surgery in 2 weeks 2. Aspirin for DVT prophylaxis per orthopedic surgery 3. Please obtain BMP/CBC in one week  Discharge Condition: stable CODE STATUS: DNR Diet recommendation: regular  HPI: [name] [name] is a 82 y.o. female with medical history significant of asthma, type 2 diabetes, hyperlipidemia, hypertension, chronic headaches, COPD, pulmonary fibrosis, B12 deficiency, vitamin D deficiency, depression, Alzheimer's dementia, hypothyroidism who is usually ambulatory at the nursing home facility where she lives and comes via EMS to the emergency department due to having a left hip fracture diagnosed by a mobile x-ray. There is no known history of fall or any other history of trauma. She is currently sedated due to analgesics and unable to provide any history. Per patient's niece, the patient is usually ambulatory.  Hospital Course: Discharge Diagnoses:  Principal Problem:   Closed left hip fracture [facility]) Active Problems:   Hypothyroidism   Essential hypertension   Asthma   GERD   Dementia  Closed left hip fracture [facility]) - Orthopedic surgery consulted and have followed patient while hospitalized, appreciate input. She is s/p anterior approach hemi hip arthroplasty 6/29. Physical therapy evaluate the patient and recommended SNF, and this will be arranged on discharge. DVT prophylaxis and pain management per orthopedic surgery. Dementia with behavioral disturbances - Supportive care, on her home medications. Continue clonazepam, zoloft, trazodone.  Diarrhea - patient with 3 episodes of diarrhea 7/1, possibly due to Zosyn, discontinued since cultures negative. C diff  negative Acute encephalopathy / febrile episode 6/28 - Patient with progressive lethargy 6/28, rapid response called, had a febrile episode and antibiotics were started out of concern for early sepsis. Blood cultures remained negative, urine culture negative, no obvious source was identified, she is afebrile and without leukocytosis, was monitored off antibiotics for 48 hours has remained stable, afebrile and without leukocytosis LBBB - 2D echo done 6/28, normal EF, no WMA, grade 1 DD, cardiology evaluated pre-op Hypothyroidism - on levothyroxine  Essential hypertension - continue current management. Asthma - Asymptomatic at this time, bronchodilators as needed. GERD ABLA - hemoglobin stable now, did not require blood transfusion, recommend repeating CBC in about 3-4 days   Discharge Instructions  Discharge Instructions    Weight bearing as tolerated    Complete by:  As directed             Medication List    STOP taking these medications        benazepril 10 MG tablet  Commonly known as:  LOTENSIN     citalopram 20 MG tablet  Commonly known as:  CELEXA     fluconazole 150 MG tablet  Commonly known as:  DIFLUCAN     haloperidol 5 MG tablet  Commonly known as:  HALDOL     hydrochlorothiazide 25 MG tablet  Commonly known as:  HYDRODIURIL     metFORMIN 500 MG 24 hr tablet  Commonly known as:  GLUCOPHAGE-XR      TAKE these medications        acetaminophen 325 MG tablet  Commonly known as:  TYLENOL  Take 2 tablets (650 mg total) by mouth every 6 (six) hours as needed for mild pain, moderate pain or fever (or Fever >/= 101).  aspirin EC 325 MG tablet  Take 1 tablet (325 mg total) by mouth daily.     clonazePAM 0.5 MG tablet  Commonly known as:  KLONOPIN  Take 0.5 tablets (0.25 mg total) by mouth daily as needed for anxiety.     clonazePAM 1 MG tablet  Commonly known as:  KLONOPIN  Take 1 tablet (1 mg total) by mouth at bedtime.     Cranberry 425 MG Caps  Take  1 capsule by mouth daily.     docusate sodium 100 MG capsule  Commonly known as:  COLACE  Take 100 mg by mouth daily.     levothyroxine 25 MCG tablet  Commonly known as:  SYNTHROID, LEVOTHROID  Take 25 mcg by mouth daily before breakfast.     loratadine 10 MG tablet  Commonly known as:  CLARITIN  Take 10 mg by mouth every morning.     Melatonin 10 MG Caps  Take 1 capsule by mouth daily.     omeprazole 20 MG capsule  Commonly known as:  PRILOSEC  Take 1 capsule (20 mg total) by mouth daily.     sertraline 25 MG tablet  Commonly known as:  ZOLOFT  Take 25 mg by mouth daily. Take along with the 100mg  tablet to make a total of 125mg  daily     sertraline 100 MG tablet  Commonly known as:  ZOLOFT  Take 100 mg by mouth daily. Take along with the 25mg  tablet to make a total dose of 125mg  daily     traZODone 100 MG tablet  Commonly known as:  DESYREL  Take 100 mg by mouth at bedtime.     verapamil 240 MG CR tablet  Commonly known as:  CALAN-SR  take 1 tablet by mouth once daily for blood pressure           Follow-up Information    Follow up with MURPHY, TIMOTHY D, MD In 2 weeks.   Specialty:  Orthopedic Surgery   Contact information:   [address] [place] [address] [phone]      Allergies  Allergen Reactions  . Iron     GI  . Meloxicam Other (See Comments)    Abd pain  . Meperidine Hcl Other (See Comments)    agitated  . Pravastatin     myalgias  . Zolpidem Tartrate Other (See Comments)    confusion  . Codeine Palpitations    "heart raced"    Consultations:    Procedures/Studies: Dg Chest 1 View  07/26/2015  CLINICAL DATA:  Left femoral neck fracture. Preoperative respiratory evaluation. EXAM: CHEST 1 VIEW COMPARISON:  08/04/2014 and earlier. FINDINGS: AP supine examination was performed. Suboptimal inspiration. Cardiac silhouette mildly to moderately enlarged, unchanged. Pulmonary venous hypertension with perhaps mild  perihilar pulmonary edema. Lungs otherwise clear. No pleural effusions. IMPRESSION: Stable cardiomegaly.  Possible mild perihilar pulmonary edema. Electronically Signed   By: Evangeline Dakin M.D.   On: 07/26/2015 20:48   Ct Head Wo Contrast  07/26/2015  CLINICAL DATA:  Fall.  Trauma. EXAM: CT HEAD WITHOUT CONTRAST TECHNIQUE: Contiguous axial images were obtained from the base of the skull through the vertex without intravenous contrast. COMPARISON:  February 22, 2014 FINDINGS: No subdural, epidural, or subarachnoid hemorrhage. Cerebellum, brainstem, and basal cisterns are normal. Moderate white matter changes are stable. Ventricles and sulci are unchanged. No acute cortical ischemia or infarct. Basal ganglia calcifications are noted. IMPRESSION: No acute intracranial process. Electronically Signed   By: Shanon Brow  Jimmye Norman III M.D   On: 07/26/2015 20:43   Ct Cervical Spine Wo Contrast  07/26/2015  CLINICAL DATA:  Pain after fall. EXAM: CT CERVICAL SPINE WITHOUT CONTRAST TECHNIQUE: Multidetector CT imaging of the cervical spine was performed without intravenous contrast. Multiplanar CT image reconstructions were also generated. COMPARISON:  None. FINDINGS: No fracture or traumatic malalignment. 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 Multilevel degenerative changes are noted. IMPRESSION: No fracture or traumatic malalignment.  Degenerative change. Electronically Signed   By: Dorise Bullion III M.D   On: 07/26/2015 20:53   Dg Chest Port 1 View  07/27/2015  CLINICAL DATA:  Sepsis EXAM: PORTABLE CHEST 1 VIEW COMPARISON:  08/18/2015 FINDINGS: Chronic cardiomegaly and main pulmonary artery enlargement. Interstitial coarsening that is similar to prior, without effusion or Kerley line. No convincing consolidation, limited behind the heart on this portable study. Symmetric biapical pleural thickening. IMPRESSION: No convincing pneumonia. Cardiomegaly and bronchitic or congestive interstitial coarsening.  Electronically Signed   By: Monte Fantasia M.D.   On: 07/27/2015 13:59   Dg Hip Operative Unilat W Or W/o Pelvis Left  07/28/2015  CLINICAL DATA:  Z41.9 (ICD-10-CM) - Surgery, elective Fluoro 24 seconds EXAM: OPERATIVE LEFT HIP (WITH PELVIS IF PERFORMED) 2 VIEWS TECHNIQUE: Fluoroscopic spot image(s) were submitted for interpretation post-operatively. COMPARISON:  07/26/2015 FINDINGS: The patient has undergone left hip hemi arthroplasty. The femoral head component appears well seated in the acetabulum. No interval fracture. IMPRESSION: Status post left hip arthroplasty. Electronically Signed   By: Nolon Nations M.D.   On: 07/28/2015 13:08   Dg Hip Unilat With Pelvis 2-3 Views Left  07/26/2015  CLINICAL DATA:  82 year old nursing home patient who is unresponsive but was found to have a left hip fracture on a mobile x-ray examination earlier today. EXAM: DG HIP (WITH OR WITHOUT PELVIS) 2-3V LEFT COMPARISON:  None. The mobile x-ray is not available for comparison. FINDINGS: Acute comminuted subcapital left femoral neck fracture. Hip joint intact with mild joint space narrowing. Included AP pelvis demonstrates no fractures elsewhere. Symmetric mild joint space narrowing in the contralateral right hip. Sacroiliac joints symphysis pubis intact. Degenerative changes involving the visualized lower lumbar spine. IMPRESSION: Acute comminuted subcapital left femoral neck fracture. Electronically Signed   By: Evangeline Dakin M.D.   On: 07/26/2015 20:47      Subjective:   Discharge Exam: Filed Vitals:   07/31/15 1943 08/01/15 0357  BP: 122/56 145/50  Pulse: 76 85  Temp: 99.3 F (37.4 C) 98.6 F (37 C)  Resp: 18 19   Filed Vitals:   07/30/15 1952 07/31/15 0455 07/31/15 1943 08/01/15 0357  BP: 141/63 155/62 122/56 145/50  Pulse: 83 93 76 85  Temp: 98.4 F (36.9 C) 98.6 F (37 C) 99.3 F (37.4 C) 98.6 F (37 C)  TempSrc: Axillary Axillary Axillary Axillary  Resp: 18 18 18 19   Weight:        SpO2: 90% 92% 94% 94%    General: Pt is alert, awake, not in acute distress Cardiovascular: RRR, S1/S2 +, no rubs, no gallops Respiratory: CTA bilaterally, no wheezing, no rhonchi Abdominal: Soft, NT, ND, bowel sounds + Extremities: no edema, no cyanosis    The results of significant diagnostics from this hospitalization (including imaging, microbiology, ancillary and laboratory) are listed below for reference.     Microbiology: Recent Results (from the past 240 hour(s))  Urine culture     Status: None   Collection Time: 07/26/15  9:14 PM  Result Value Ref Range  Status   Specimen Description URINE, CATHETERIZED  Final   Special Requests NONE  Final   Culture NO GROWTH  Final   Report Status 07/28/2015 FINAL  Final  Culture, blood (x 2)     Status: None   Collection Time: 07/27/15  1:30 PM  Result Value Ref Range Status   Specimen Description BLOOD RIGHT ANTECUBITAL  Final   Special Requests BOTTLES DRAWN AEROBIC ONLY 5ML  Final   Culture NO GROWTH 5 DAYS  Final   Report Status 08/01/2015 FINAL  Final  Culture, blood (x 2)     Status: None   Collection Time: 07/27/15  1:38 PM  Result Value Ref Range Status   Specimen Description BLOOD RIGHT HAND  Final   Special Requests IN PEDIATRIC BOTTLE 2.5ML  Final   Culture NO GROWTH 5 DAYS  Final   Report Status 08/01/2015 FINAL  Final  Surgical pcr screen     Status: None   Collection Time: 07/27/15  8:33 PM  Result Value Ref Range Status   MRSA, PCR NEGATIVE NEGATIVE Final   Staphylococcus aureus NEGATIVE NEGATIVE Final    Comment:        The Xpert SA Assay (FDA approved for NASAL specimens in patients over 77 years of age), is one component of a comprehensive surveillance program.  Test performance has been validated by [facility] for patients greater than or equal to 54 year old. It is not intended to diagnose infection nor to guide or monitor treatment.   C difficile quick scan w PCR reflex     Status: None    Collection Time: 07/30/15 12:11 PM  Result Value Ref Range Status   C Diff antigen NEGATIVE NEGATIVE Final   C Diff toxin NEGATIVE NEGATIVE Final   C Diff interpretation Negative for toxigenic C. difficile  Final     Labs: BNP (last 3 results) No results for input(s): BNP in the last 8760 hours. Basic Metabolic Panel:  Recent Labs Lab 07/27/15 1322 07/28/15 0356 07/29/15 0658 07/30/15 0707 07/31/15 0443  NA 137 136 137 137 136  K 3.3* 3.3* 3.3* 3.1* 3.4*  CL 103 104 105 102 106  CO2 26 23 22 24 23   GLUCOSE 163* 124* 169* 143* 152*  BUN 15 23* 24* 18 20  CREATININE 0.76 0.85 1.01* 0.86 0.77  CALCIUM 8.9 8.5* 8.1* 8.4* 8.0*   Liver Function Tests:  Recent Labs Lab 07/26/15 2001 07/27/15 0740 07/27/15 1322 07/28/15 0356 07/29/15 0658  AST 38 33 33 54* 78*  ALT 34 31 32 53 53  ALKPHOS 95 87 105 89 155*  BILITOT 0.9 0.7 0.9 0.8 0.9  PROT 6.7 6.0* 6.5 5.4* 5.4*  ALBUMIN 3.6 3.2* 3.3* 2.7* 2.6*   No results for input(s): LIPASE, AMYLASE in the last 168 hours. No results for input(s): AMMONIA in the last 168 hours. CBC:  Recent Labs Lab 07/26/15 2001 07/27/15 0740 07/27/15 1322 07/28/15 0356 07/29/15 [id number] 07/30/15 0707 07/31/15 0443 08/01/15 0312  WBC 14.1* 13.0* 12.0* 8.4 8.7 10.1 9.6 7.7  NEUTROABS 11.9* 11.4* 10.4* 6.5 7.2  --   --   --   HGB 12.2 11.5* 12.2 10.3* 10.3* 9.7* 8.5* 8.5*  HCT 35.7* 34.1* 36.8 32.1* 31.0* 29.4* 25.8* 26.1*  MCV 87.3 86.3 89.1 89.9 90.9 88.6 88.7 90.3  PLT 226 196 209 194 178 198 211 262   Cardiac Enzymes: No results for input(s): CKTOTAL, CKMB, CKMBINDEX, TROPONINI in the last 168 hours. BNP: Invalid input(s): POCBNP  CBG:  Recent Labs Lab 07/31/15 1152 07/31/15 1643 07/31/15 2057 08/01/15 0935 08/01/15 1117  GLUCAP 148* 115* 143* 152* 140*   D-Dimer No results for input(s): DDIMER in the last 72 hours. Hgb A1c No results for input(s): HGBA1C in the last 72 hours. Lipid Profile No results for input(s): CHOL,  HDL, LDLCALC, TRIG, CHOLHDL, LDLDIRECT in the last 72 hours. Thyroid function studies No results for input(s): TSH, T4TOTAL, T3FREE, THYROIDAB in the last 72 hours.  Invalid input(s): FREET3 Anemia work up No results for input(s): VITAMINB12, FOLATE, FERRITIN, TIBC, IRON, RETICCTPCT in the last 72 hours. Urinalysis    Component Value Date/Time   COLORURINE YELLOW 07/26/2015 2114   APPEARANCEUR CLEAR 07/26/2015 2114   LABSPEC 1.018 07/26/2015 2114   PHURINE 7.5 07/26/2015 2114   GLUCOSEU 500* 07/26/2015 2114   HGBUR NEGATIVE 07/26/2015 2114   BILIRUBINUR NEGATIVE 07/26/2015 2114   KETONESUR 15* 07/26/2015 2114   PROTEINUR 100* 07/26/2015 2114   UROBILINOGEN 0.2 08/04/2014 1544   NITRITE NEGATIVE 07/26/2015 2114   LEUKOCYTESUR NEGATIVE 07/26/2015 2114   Sepsis Labs Invalid input(s): PROCALCITONIN,  WBC,  LACTICIDVEN Microbiology Recent Results (from the past 240 hour(s))  Urine culture     Status: None   Collection Time: 07/26/15  9:14 PM  Result Value Ref Range Status   Specimen Description URINE, CATHETERIZED  Final   Special Requests NONE  Final   Culture NO GROWTH  Final   Report Status 07/28/2015 FINAL  Final  Culture, blood (x 2)     Status: None   Collection Time: 07/27/15  1:30 PM  Result Value Ref Range Status   Specimen Description BLOOD RIGHT ANTECUBITAL  Final   Special Requests BOTTLES DRAWN AEROBIC ONLY 5ML  Final   Culture NO GROWTH 5 DAYS  Final   Report Status 08/01/2015 FINAL  Final  Culture, blood (x 2)     Status: None   Collection Time: 07/27/15  1:38 PM  Result Value Ref Range Status   Specimen Description BLOOD RIGHT HAND  Final   Special Requests IN PEDIATRIC BOTTLE 2.5ML  Final   Culture NO GROWTH 5 DAYS  Final   Report Status 08/01/2015 FINAL  Final  Surgical pcr screen     Status: None   Collection Time: 07/27/15  8:33 PM  Result Value Ref Range Status   MRSA, PCR NEGATIVE NEGATIVE Final   Staphylococcus aureus NEGATIVE NEGATIVE Final     Comment:        The Xpert SA Assay (FDA approved for NASAL specimens in patients over 59 years of age), is one component of a comprehensive surveillance program.  Test performance has been validated by [facility] for patients greater than or equal to 66 year old. It is not intended to diagnose infection nor to guide or monitor treatment.   C difficile quick scan w PCR reflex     Status: None   Collection Time: 07/30/15 12:11 PM  Result Value Ref Range Status   C Diff antigen NEGATIVE NEGATIVE Final   C Diff toxin NEGATIVE NEGATIVE Final   C Diff interpretation Negative for toxigenic C. difficile  Final     Time coordinating discharge: Over 30 minutes  SIGNED:  Marzetta Board, MD  [facility] 08/01/2015, 11:46 AM Pager [phone]  If 7PM-7AM, please contact night-coverage [email or web address] Password [email or web address]

## 2015-08-01 NOTE — Progress Notes (Signed)
   Assessment: 4 Days Post-Op  S/P Procedure(s) (LRB): ANTERIOR APPROACH HEMI HIP ARTHROPLASTY (Left) by Dr. Ernesta Amble. Murphy on 07/28/15  Principal Problem:   Closed left hip fracture ([place]) Active Problems:   Hypothyroidism   Essential hypertension   Asthma   GERD   Dementia Acute Blood loss anemia - likely with dilutional component. H/H stable.    Plan: Up with therapy Weight Bearing: Weight Bearing as Tolerated (WBAT) left leg Dressings: prn.  VTE prophylaxis: Per primary.  Heparin while inpatient.  ASA after d/c. Dispo: Per primary.  Stable from an orthopedic perspective. [name] today.   Subjective: Asleep, resting comfortably on arrival.  Family reports her pain as decreased - she has seemed comfortable and has been moving both legs in bed.   Objective:   VITALS:   Filed Vitals:   07/30/15 1952 07/31/15 0455 07/31/15 1943 08/01/15 0357  BP: 141/63 155/62 122/56 145/50  Pulse: 83 93 76 85  Temp: 98.4 F (36.9 C) 98.6 F (37 C) 99.3 F (37.4 C) 98.6 F (37 C)  TempSrc: Axillary Axillary Axillary Axillary  Resp: 18 18 18 19   Weight:      SpO2: 90% 92% 94% 94%    Physical Exam General: NAD.  Resting comfortably supine in bed with eyes closed.  Family at bedside  MSK: Extremities warm Dorsiflexion/Plantar flexion intact Incision: dressing C/D/I   Prudencio Burly III 08/01/2015, 8:30 AM

## 2015-08-03 ENCOUNTER — Encounter: Payer: Self-pay | Admitting: Internal Medicine

## 2015-08-03 ENCOUNTER — Non-Acute Institutional Stay (SKILLED_NURSING_FACILITY): Payer: Medicare Other | Admitting: Internal Medicine

## 2015-08-03 DIAGNOSIS — K219 Gastro-esophageal reflux disease without esophagitis: Secondary | ICD-10-CM

## 2015-08-03 DIAGNOSIS — B3789 Other sites of candidiasis: Secondary | ICD-10-CM

## 2015-08-03 DIAGNOSIS — R1310 Dysphagia, unspecified: Secondary | ICD-10-CM | POA: Diagnosis not present

## 2015-08-03 DIAGNOSIS — F411 Generalized anxiety disorder: Secondary | ICD-10-CM

## 2015-08-03 DIAGNOSIS — R195 Other fecal abnormalities: Secondary | ICD-10-CM

## 2015-08-03 DIAGNOSIS — I10 Essential (primary) hypertension: Secondary | ICD-10-CM

## 2015-08-03 DIAGNOSIS — E876 Hypokalemia: Secondary | ICD-10-CM

## 2015-08-03 DIAGNOSIS — R5381 Other malaise: Secondary | ICD-10-CM | POA: Diagnosis not present

## 2015-08-03 DIAGNOSIS — E039 Hypothyroidism, unspecified: Secondary | ICD-10-CM | POA: Diagnosis not present

## 2015-08-03 DIAGNOSIS — D62 Acute posthemorrhagic anemia: Secondary | ICD-10-CM | POA: Diagnosis not present

## 2015-08-03 DIAGNOSIS — S72002S Fracture of unspecified part of neck of left femur, sequela: Secondary | ICD-10-CM

## 2015-08-03 DIAGNOSIS — E46 Unspecified protein-calorie malnutrition: Secondary | ICD-10-CM | POA: Diagnosis not present

## 2015-08-03 DIAGNOSIS — G4700 Insomnia, unspecified: Secondary | ICD-10-CM

## 2015-08-03 DIAGNOSIS — F329 Major depressive disorder, single episode, unspecified: Secondary | ICD-10-CM

## 2015-08-03 DIAGNOSIS — F0391 Unspecified dementia with behavioral disturbance: Secondary | ICD-10-CM

## 2015-08-03 NOTE — Progress Notes (Signed)
LOCATION: [name]  PCP: Alesia Richards, MD   Code Status: Full Code  Goals of care: Advanced Directive information Advanced Directives 07/26/2015  Does patient have an advance directive? Yes  Type of Advance Directive Out of facility DNR (pink MOST or yellow form)  Copy of advanced directive(s) in chart? Yes  Pre-existing out of facility DNR order (yellow form or pink MOST form) Physician notified to receive inpatient order       Extended Emergency Contact Information Primary Emergency Contact: Luddy,Wayne Address: [address]          [place], [place] [address] Johnnette Litter of [place] Phone: [phone] Mobile Phone: [phone] Relation: Son Secondary Emergency Contact: Tannehill,Angel Address: [address]          [place], [place] [address] Johnnette Litter of [place] Phone: [phone] Mobile Phone: [phone] Relation: Daughter   Allergies  Allergen Reactions  . Iron     GI  . Meloxicam Other (See Comments)    Abd pain  . Meperidine Hcl Other (See Comments)    agitated  . Pravastatin     myalgias  . Zolpidem Tartrate Other (See Comments)    confusion  . Codeine Palpitations    "heart raced"    Chief Complaint  Patient presents with  . New Admit To SNF    New Admission     HPI:  Patient is a 82 y.o. female seen today for short term rehabilitation post hospital admission from 07/26/15-08/01/15 with left hip closed fracture. She underwent left hip hemiarthroplasty on 07/28/15. She was residing in memory care centre prior to this. She is seen in her room today with her daughter in law at bedside. She does not participate in HPI and ROS with her advanced dementia.  Review of Systems: Limited ROS. Obtained from her family member Constitutional: Negative for fever  HENT: Negative for headache, nasal discharge. Eyes: Negative for discharge.  Respiratory: Negative for shortness of breath and wheezing.  has occasional cough.   Cardiovascular: Negative for chest pain, palpitations, leg swelling.  Gastrointestinal: Negative for heartburn, nausea, vomiting, abdominal pain. Had bowel movement today Genitourinary: Negative for dysuria. Has urinary incontinence. Rash to her bottom and groin area. Musculoskeletal: Negative for fall in this facility.   Skin: Negative for itching Psychiatric/Behavioral: positive for memory loss.    Past Medical History  Diagnosis Date  . Asthma   . Type II or unspecified type diabetes mellitus without mention of complication, not stated as uncontrolled   . Hyperlipidemia   . Chronic headaches   . Hypertension   . Pulmonary fibrosis (HCC)     Chronic granulomatous changes on CT chest, minimal mediastinal LAN 3/09  . COPD (chronic obstructive pulmonary disease) ([facility])   . Vitamin D deficiency   . B12 deficiency   . Allergy   . Dementia   . Depression   . Hypothyroidism   . CKD (chronic kidney disease) stage 3, GFR 30-59 ml/min    Past Surgical History  Procedure Laterality Date  . Appendectomy    . Thyroidectomy    . Tubal ligation    . Esophagogastroduodenoscopy  2012    Gastritis  . Breast biopsy      right breast/benign  . Endometrial biopsy  1998    negative  . Anterior approach hemi hip arthroplasty Left 07/28/2015    Procedure: ANTERIOR APPROACH HEMI HIP ARTHROPLASTY;  Surgeon: Renette Butters, MD;  Location: [facility];  Service: Orthopedics;  Laterality: Left;  Social History:   reports that she has never smoked. She has never used smokeless tobacco. She reports that she does not drink alcohol or use illicit drugs.  Family History  Problem Relation Age of Onset  . Asthma Sister   . Cancer Sister     breast, colon  . Diabetes Mother   . Cancer Mother     breast  . Diabetes Sister   . Cancer Sister 72    breast  . Heart disease Father   . Hypertension Father   . Cancer Brother     leukemia  . Cancer Brother     lung  . Hypertension Brother   .  Hyperlipidemia Brother     Medications:   Medication List       This list is accurate as of: 08/03/15 12:43 PM.  Always use your most recent med list.               acetaminophen 325 MG tablet  Commonly known as:  TYLENOL  Take 2 tablets (650 mg total) by mouth every 6 (six) hours as needed for mild pain, moderate pain or fever (or Fever >/= 101).     aspirin EC 325 MG tablet  Take 1 tablet (325 mg total) by mouth daily.     clonazePAM 0.5 MG tablet  Commonly known as:  KLONOPIN  Take 0.5 tablets (0.25 mg total) by mouth daily as needed for anxiety.     clonazePAM 1 MG tablet  Commonly known as:  KLONOPIN  Take 1 tablet (1 mg total) by mouth at bedtime.     Cranberry 425 MG Caps  Take 1 capsule by mouth daily.     docusate sodium 100 MG capsule  Commonly known as:  COLACE  Take 100 mg by mouth daily.     levothyroxine 25 MCG tablet  Commonly known as:  SYNTHROID, LEVOTHROID  Take 25 mcg by mouth daily before breakfast.     loratadine 10 MG tablet  Commonly known as:  CLARITIN  Take 10 mg by mouth every morning.     Melatonin 10 MG Caps  Take 1 capsule by mouth daily.     nystatin cream  Commonly known as:  MYCOSTATIN  Apply 1 application topically 2 (two) times daily.     omeprazole 20 MG capsule  Commonly known as:  PRILOSEC  Take 1 capsule (20 mg total) by mouth daily.     sertraline 25 MG tablet  Commonly known as:  ZOLOFT  Take 25 mg by mouth daily. Take along with the 100mg  tablet to make a total of 125mg  daily     sertraline 100 MG tablet  Commonly known as:  ZOLOFT  Take 100 mg by mouth daily. Take along with the 25mg  tablet to make a total dose of 125mg  daily     traZODone 100 MG tablet  Commonly known as:  DESYREL  Take 100 mg by mouth at bedtime.     verapamil 240 MG CR tablet  Commonly known as:  CALAN-SR  take 1 tablet by mouth once daily for blood pressure        Immunizations: Immunization History  Administered Date(s)  Administered  . Influenza, High Dose Seasonal PF 02/04/2015  . Influenza-Unspecified 11/26/2012  . PPD Test 02/04/2015, 08/01/2015  . Pneumococcal Conjugate-13 02/04/2015  . Pneumococcal-Unspecified 02/01/2009  . Td 02/02/2012     Physical Exam: Filed Vitals:   08/03/15 1238  BP: 114/68  Pulse: 63  Temp: 98.1  F (36.7 C)  TempSrc: Oral  Resp: 18  Height: 5\' 6"  (1.676 m)  Weight: 149 lb (67.586 kg)  SpO2: 96%   Body mass index is 24.06 kg/(m^2).  General- elderly female, in no acute distress Head- normocephalic, atraumatic Nose-  no nasal discharge Throat- moist mucus membrane Eyes- PERRLA, EOMI, no pallor, no icterus Neck- no cervical lymphadenopathy Cardiovascular- normal s1,s2, no murmur Respiratory- bilateral clear to auscultation, no wheeze, no rhonchi, no crackles, no use of accessory muscles Abdomen- bowel sounds present, soft, non tender Musculoskeletal- able to move all 4 extremities, limited left hip range of motion Neurological- unable to assess Skin- warm and dry, right arm skin tear, left hip surgical incision site with steri strip, bruise to arms and legs, redness to his groin area and buttock crease, chronic stasis changes to her legs Psychiatry- normal affect    Labs reviewed: Basic Metabolic Panel:  Recent Labs  07/29/15 0658 07/30/15 0707 07/31/15 0443  NA 137 137 136  K 3.3* 3.1* 3.4*  CL 105 102 106  CO2 22 24 23   GLUCOSE 169* 143* 152*  BUN 24* 18 20  CREATININE 1.01* 0.86 0.77  CALCIUM 8.1* 8.4* 8.0*   Liver Function Tests:  Recent Labs  07/27/15 1322 07/28/15 0356 07/29/15 0658  AST 33 54* 78*  ALT 32 53 53  ALKPHOS 105 89 155*  BILITOT 0.9 0.8 0.9  PROT 6.5 5.4* 5.4*  ALBUMIN 3.3* 2.7* 2.6*   No results for input(s): LIPASE, AMYLASE in the last 8760 hours. No results for input(s): AMMONIA in the last 8760 hours. CBC:  Recent Labs  07/27/15 1322 07/28/15 0356 07/29/15 0658 07/30/15 0707 07/31/15 0443  08/01/15 0312  WBC 12.0* 8.4 8.7 10.1 9.6 7.7  NEUTROABS 10.4* 6.5 7.2  --   --   --   HGB 12.2 10.3* 10.3* 9.7* 8.5* 8.5*  HCT 36.8 32.1* 31.0* 29.4* 25.8* 26.1*  MCV 89.1 89.9 90.9 88.6 88.7 90.3  PLT 209 194 178 198 211 262   Cardiac Enzymes: No results for input(s): CKTOTAL, CKMB, CKMBINDEX, TROPONINI in the last 8760 hours. BNP: Invalid input(s): POCBNP CBG:  Recent Labs  07/31/15 2057 08/01/15 0935 08/01/15 1117  GLUCAP 143* 152* 140*    Radiological Exams: Dg Chest 1 View  07/26/2015  CLINICAL DATA:  Left femoral neck fracture. Preoperative respiratory evaluation. EXAM: CHEST 1 VIEW COMPARISON:  08/04/2014 and earlier. FINDINGS: AP supine examination was performed. Suboptimal inspiration. Cardiac silhouette mildly to moderately enlarged, unchanged. Pulmonary venous hypertension with perhaps mild perihilar pulmonary edema. Lungs otherwise clear. No pleural effusions. IMPRESSION: Stable cardiomegaly.  Possible mild perihilar pulmonary edema. Electronically Signed   By: Evangeline Dakin M.D.   On: 07/26/2015 20:48   Ct Head Wo Contrast  07/26/2015  CLINICAL DATA:  Fall.  Trauma. EXAM: CT HEAD WITHOUT CONTRAST TECHNIQUE: Contiguous axial images were obtained from the base of the skull through the vertex without intravenous contrast. COMPARISON:  February 22, 2014 FINDINGS: No subdural, epidural, or subarachnoid hemorrhage. Cerebellum, brainstem, and basal cisterns are normal. Moderate white matter changes are stable. Ventricles and sulci are unchanged. No acute cortical ischemia or infarct. Basal ganglia calcifications are noted. IMPRESSION: No acute intracranial process. Electronically Signed   By: Dorise Bullion III M.D   On: 07/26/2015 20:43   Ct Cervical Spine Wo Contrast  07/26/2015  CLINICAL DATA:  Pain after fall. EXAM: CT CERVICAL SPINE WITHOUT CONTRAST TECHNIQUE: Multidetector CT imaging of the cervical spine was performed without intravenous contrast. Multiplanar  CT image  reconstructions were also generated. COMPARISON:  None. FINDINGS: No fracture or traumatic malalignment. 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 Multilevel degenerative changes are noted. IMPRESSION: No fracture or traumatic malalignment.  Degenerative change. Electronically Signed   By: Dorise Bullion III M.D   On: 07/26/2015 20:53   Dg Chest Port 1 View  07/27/2015  CLINICAL DATA:  Sepsis EXAM: PORTABLE CHEST 1 VIEW COMPARISON:  08/18/2015 FINDINGS: Chronic cardiomegaly and main pulmonary artery enlargement. Interstitial coarsening that is similar to prior, without effusion or Kerley line. No convincing consolidation, limited behind the heart on this portable study. Symmetric biapical pleural thickening. IMPRESSION: No convincing pneumonia. Cardiomegaly and bronchitic or congestive interstitial coarsening. Electronically Signed   By: Monte Fantasia M.D.   On: 07/27/2015 13:59   Dg Hip Operative Unilat W Or W/o Pelvis Left  07/28/2015  CLINICAL DATA:  Z41.9 (ICD-10-CM) - Surgery, elective Fluoro 24 seconds EXAM: OPERATIVE LEFT HIP (WITH PELVIS IF PERFORMED) 2 VIEWS TECHNIQUE: Fluoroscopic spot image(s) were submitted for interpretation post-operatively. COMPARISON:  07/26/2015 FINDINGS: The patient has undergone left hip hemi arthroplasty. The femoral head component appears well seated in the acetabulum. No interval fracture. IMPRESSION: Status post left hip arthroplasty. Electronically Signed   By: Nolon Nations M.D.   On: 07/28/2015 13:08   Dg Hip Unilat With Pelvis 2-3 Views Left  07/26/2015  CLINICAL DATA:  82 year old nursing home patient who is unresponsive but was found to have a left hip fracture on a mobile x-ray examination earlier today. EXAM: DG HIP (WITH OR WITHOUT PELVIS) 2-3V LEFT COMPARISON:  None. The mobile x-ray is not available for comparison. FINDINGS: Acute comminuted subcapital left femoral neck fracture. Hip joint intact with mild joint space narrowing. Included AP  pelvis demonstrates no fractures elsewhere. Symmetric mild joint space narrowing in the contralateral right hip. Sacroiliac joints symphysis pubis intact. Degenerative changes involving the visualized lower lumbar spine. IMPRESSION: Acute comminuted subcapital left femoral neck fracture. Electronically Signed   By: Evangeline Dakin M.D.   On: 07/26/2015 20:47    Assessment/Plan  Physical deconditioning Will have her work with physical therapy and occupational therapy team to help with gait training and muscle strengthening exercises.fall precautions. Skin care. Encourage to be out of bed.   Left hip fracture S/p left hip hemiarthroplasty. Has orthopedic follow up. Currently on tylenol 650 mg q6h prn, patient unable to ask for it with her dementia. Change this to tylenol 650 mg qid. Continue aspirin 325 mg daily for dvt prophylaxis.  Protein calorie malnutrition Monitor and encourage po intake, to provide assistance with feed for now. Get RD consult. Monitor weight  Hypokalemia Monitor bmp  Blood loss anemia Post op, monitor cbc  Dysphagia Get SLP to evaluate and provide mechanical soft diet for now, aspiration precautions  Advanced dementia Was at memory care and goal is to return there after STR. Continue supportive care for now  Chronic Depression Continue sertraline 125 mg daily  Hypothyroidism Continue synthroid, no changes made  Loose stool Currently on stool softner. D/c her colace  Candida rash To groin and buttock area. Continue nystatin cream and skin care.  Insomnia Continue trazodone and melatonin  HTN Continue verapamil 240 mg daily and monitor BP  Anxiety disorder Change her bedtime klonopin to 1 mg at 6 pm. Continue prn klonopin order only for severe anxiety  gerd Continue prilosec and monitor her   Goals of care: short term rehabilitation   Labs/tests ordered: cbc, cmp 08/08/15  Family/  staff Communication: reviewed care plan with patient and  nursing supervisor    Blanchie Serve, MD Internal Medicine [place], [place] [address] Cell Phone (Monday-Friday 8 am - 5 pm): [phone] On Call: [phone] and follow prompts after 5 pm and on weekends Office Phone: [phone] Office Fax: [phone]

## 2015-08-08 LAB — BASIC METABOLIC PANEL
BUN: 12 mg/dL (ref 4–21)
Creatinine: 0.6 mg/dL (ref <=1.1)
Glucose: 92 mg/dL
POTASSIUM: 4.6 mmol/L (ref 3.4–5.3)
SODIUM: 143 mmol/L (ref 137–147)

## 2015-08-08 LAB — CBC AND DIFFERENTIAL
HEMATOCRIT: 30 % — AB (ref 36–46)
HEMOGLOBIN: 9.2 g/dL — AB (ref 12.0–16.0)
Platelets: 492 10*3/uL — AB (ref 150–399)
WBC: 6 10^3/mL

## 2015-08-08 LAB — HEPATIC FUNCTION PANEL
ALK PHOS: 190 U/L — AB (ref 25–125)
ALT: 41 U/L — AB (ref 7–35)
AST: 28 U/L (ref 13–35)
BILIRUBIN, TOTAL: 0.3 mg/dL

## 2015-08-10 DIAGNOSIS — S72001D Fracture of unspecified part of neck of right femur, subsequent encounter for closed fracture with routine healing: Secondary | ICD-10-CM | POA: Diagnosis not present

## 2015-08-16 ENCOUNTER — Encounter: Payer: Self-pay | Admitting: Family

## 2015-08-16 ENCOUNTER — Non-Acute Institutional Stay (SKILLED_NURSING_FACILITY): Payer: Medicare Other | Admitting: Family

## 2015-08-16 DIAGNOSIS — S72002D Fracture of unspecified part of neck of left femur, subsequent encounter for closed fracture with routine healing: Secondary | ICD-10-CM

## 2015-08-16 DIAGNOSIS — F329 Major depressive disorder, single episode, unspecified: Secondary | ICD-10-CM

## 2015-08-16 DIAGNOSIS — R1310 Dysphagia, unspecified: Secondary | ICD-10-CM

## 2015-08-16 DIAGNOSIS — E039 Hypothyroidism, unspecified: Secondary | ICD-10-CM

## 2015-08-16 DIAGNOSIS — G4700 Insomnia, unspecified: Secondary | ICD-10-CM | POA: Diagnosis not present

## 2015-08-16 DIAGNOSIS — I10 Essential (primary) hypertension: Secondary | ICD-10-CM | POA: Diagnosis not present

## 2015-08-16 DIAGNOSIS — F411 Generalized anxiety disorder: Secondary | ICD-10-CM | POA: Diagnosis not present

## 2015-08-16 DIAGNOSIS — K219 Gastro-esophageal reflux disease without esophagitis: Secondary | ICD-10-CM

## 2015-08-16 DIAGNOSIS — R269 Unspecified abnormalities of gait and mobility: Secondary | ICD-10-CM

## 2015-08-16 DIAGNOSIS — F32A Depression, unspecified: Secondary | ICD-10-CM

## 2015-08-16 NOTE — Progress Notes (Signed)
Location:   [facility] Room Number: [id number] Place of Service:  SNF (31)  Provider: Ngetich, Dinah FNP-C  PCP: Alesia Richards, MD Patient Care Team: Unk Pinto, MD as PCP - General (Internal Medicine)  Extended Emergency Contact Information Primary Emergency Contact: [facility] Address: [address]          [place], [place] [address] Johnnette Litter of [place] Phone: [phone] Mobile Phone: [phone] Relation: Son Secondary Emergency Contact: Tannehill,Angel Address: [address]          [place], [place] [address] Johnnette Litter of [place] Phone: [phone] Mobile Phone: [phone] Relation: Daughter  Code Status: Full Code Goals of care:  Advanced Directive information Advanced Directives 08/16/2015  Does patient have an advance directive? Yes  Type of Paramedic of [place];Living will  Does patient want to make changes to advanced directive? No - Patient declined  Copy of advanced directive(s) in chart? Yes  Pre-existing out of facility DNR order (yellow form or pink MOST form) -     Allergies  Allergen Reactions  . Iron     GI  . Meloxicam Other (See Comments)    Abd pain  . Meperidine Hcl Other (See Comments)    agitated  . Pravastatin     myalgias  . Zolpidem Tartrate Other (See Comments)    confusion  . Codeine Palpitations    "heart raced"    Chief Complaint  Patient presents with  . Discharge Note    HPI:  82 y.o. female seen today at [facility] for discharge to [facility].She was here for short term rehabilitation post hospital admission from 07/26/15-08/01/15 with left hip closed fracture. She underwent left hip hemiarthroplasty on 07/28/15. She was residing in memory care centre prior to hospital admission.She is seen in her room today.She has worked well with PT/OT and speech Therapist now stable for discharge back to [facility].She  will be discharged to [facility] centre with Home health PT/OT to continue with ROM, Exercise, Gait stability and muscle strengthening. She will also continue with Speech Therapist to further evaluate swallowing. She will require DME: A Front wheel Walker to allow her to maintain current level of independence with ADL's. She will also require a 3-1 for safe transfer to commode. She will also require a semi electric Hospital bed with half rails to assist with repositioning and tranfers in and out of the bed. Home health services will be arranged by facility social worker prior to discharge. She will be discharged with facility medications. Prescription medication will be written x 1 month then patient to follow up with PCP in 1-2 weeks. Limited HPI and ROS due to presence of dementia. Additional information provided by patient's family member.  Facility staff report no new concerns.      Past Medical History  Diagnosis Date  . Asthma   . Type II or unspecified type diabetes mellitus without mention of complication, not stated as uncontrolled   . Hyperlipidemia   . Chronic headaches   . Hypertension   . Pulmonary fibrosis (HCC)     Chronic granulomatous changes on CT chest, minimal mediastinal LAN 3/09  . COPD (chronic obstructive pulmonary disease) ([place])   . Vitamin D deficiency   . B12 deficiency   . Allergy   . Dementia   . Depression   . Hypothyroidism   . CKD (chronic kidney disease) stage 3, GFR 30-59 ml/min  Past Surgical History  Procedure Laterality Date  . Appendectomy    . Thyroidectomy    . Tubal ligation    . Esophagogastroduodenoscopy  2012    Gastritis  . Breast biopsy      right breast/benign  . Endometrial biopsy  1998    negative  . Anterior approach hemi hip arthroplasty Left 07/28/2015    Procedure: ANTERIOR APPROACH HEMI HIP ARTHROPLASTY;  Surgeon: Renette Butters, MD;  Location: [facility];  Service: Orthopedics;  Laterality: Left;      reports  that she has never smoked. She has never used smokeless tobacco. She reports that she does not drink alcohol or use illicit drugs. Social History   Social History  . Marital Status: Widowed    Spouse Name: N/A  . Number of Children: N/A  . Years of Education: N/A   Occupational History  . Not on file.   Social History Main Topics  . Smoking status: Never Smoker   . Smokeless tobacco: Never Used  . Alcohol Use: No  . Drug Use: No  . Sexual Activity: Not on file   Other Topics Concern  . Not on file   Social History Narrative   Functional Status Survey:    Allergies  Allergen Reactions  . Iron     GI  . Meloxicam Other (See Comments)    Abd pain  . Meperidine Hcl Other (See Comments)    agitated  . Pravastatin     myalgias  . Zolpidem Tartrate Other (See Comments)    confusion  . Codeine Palpitations    "heart raced"    Pertinent  Health Maintenance Due  Topic Date Due  . FOOT EXAM  03/20/1939  . OPHTHALMOLOGY EXAM  03/20/1939  . DEXA SCAN  03/20/1996  . URINE MICROALBUMIN  06/26/2014  . HEMOGLOBIN A1C  06/19/2015  . INFLUENZA VACCINE  08/30/2015  . PNA vac Low Risk Adult  Completed    Medications:   Medication List       This list is accurate as of: 08/16/15 11:25 AM.  Always use your most recent med list.               acetaminophen 325 MG tablet  Commonly known as:  TYLENOL  Take 2 tablets (650 mg total) by mouth every 6 (six) hours as needed for mild pain, moderate pain or fever (or Fever >/= 101).     clonazePAM 0.5 MG tablet  Commonly known as:  KLONOPIN  Take 0.5 tablets (0.25 mg total) by mouth daily as needed for anxiety.     clonazePAM 1 MG tablet  Commonly known as:  KLONOPIN  Take 1 tablet (1 mg total) by mouth at bedtime.     Cranberry 425 MG Caps  Take 1 capsule by mouth daily.     levothyroxine 25 MCG tablet  Commonly known as:  SYNTHROID, LEVOTHROID  Take 25 mcg by mouth daily before breakfast.     loratadine 10 MG  tablet  Commonly known as:  CLARITIN  Take 10 mg by mouth every morning.     Melatonin 10 MG Caps  Take 1 capsule by mouth daily.     nystatin cream  Commonly known as:  MYCOSTATIN  Apply 1 application topically 2 (two) times daily. To inguinal creases, groin, labia and peri-anal for fungal rash.     omeprazole 20 MG capsule  Commonly known as:  PRILOSEC  Take 1 capsule (20 mg total) by mouth daily.  sertraline 25 MG tablet  Commonly known as:  ZOLOFT  Take 25 mg by mouth daily. Take along with the 100mg  tablet to make a total of 125mg  daily     sertraline 100 MG tablet  Commonly known as:  ZOLOFT  Take 100 mg by mouth daily. Take along with the 25mg  tablet to make a total dose of 125mg  daily     traZODone 100 MG tablet  Commonly known as:  DESYREL  Take 100 mg by mouth at bedtime. For insomnia     verapamil 240 MG CR tablet  Commonly known as:  CALAN-SR  take 1 tablet by mouth once daily for blood pressure        Review of Systems  Unable to perform ROS: Dementia    Filed Vitals:   08/16/15 1105  BP: 131/68  Pulse: 88  Temp: 96.9 F (36.1 C)  Resp: 20  Height: 5\' 6"  (1.676 m)  Weight: 149 lb (67.586 kg)  SpO2: 98%   Body mass index is 24.06 kg/(m^2). Physical Exam  Constitutional: She appears well-developed and well-nourished.  Pleasantly confused elderly  HENT:  Head: Normocephalic.  Mouth/Throat: Oropharynx is clear and moist. No oropharyngeal exudate.  Eyes: Conjunctivae and EOM are normal. Pupils are equal, round, and reactive to light. Right eye exhibits no discharge. Left eye exhibits no discharge. No scleral icterus.  Neck: Neck supple. No JVD present.  Cardiovascular: Normal rate, regular rhythm, normal heart sounds and intact distal pulses.  Exam reveals no gallop and no friction rub.   No murmur heard. Pulmonary/Chest: Effort normal and breath sounds normal. No respiratory distress. She has no wheezes. She has no rales.  Abdominal: Soft.  Bowel sounds are normal. She exhibits no distension. There is no tenderness. There is no rebound and no guarding.  Genitourinary:  Wears diaper  Musculoskeletal: Normal range of motion. She exhibits no edema or tenderness.  Unsteady gait. Uses FWW  Lymphadenopathy:    She has no cervical adenopathy.  Neurological: She is alert.  Skin: Skin is warm and dry. No rash noted. No erythema. No pallor.  Left hip surgical incision healed. Surrounding skin without any signs of infection.   Psychiatric: She has a normal mood and affect.  Pleasantly confused.     Labs reviewed: Basic Metabolic Panel:  Recent Labs  07/29/15 0658 07/30/15 0707 07/31/15 0443 08/08/15  NA 137 137 136 143  K 3.3* 3.1* 3.4* 4.6  CL 105 102 106  --   CO2 22 24 23   --   GLUCOSE 169* 143* 152*  --   BUN 24* 18 20 12   CREATININE 1.01* 0.86 0.77 0.6  CALCIUM 8.1* 8.4* 8.0*  --    Liver Function Tests:  Recent Labs  07/27/15 1322 07/28/15 0356 07/29/15 0658 08/08/15  AST 33 54* 78* 28  ALT 32 53 53 41*  ALKPHOS 105 89 155* 190*  BILITOT 0.9 0.8 0.9  --   PROT 6.5 5.4* 5.4*  --   ALBUMIN 3.3* 2.7* 2.6*  --    No results for input(s): LIPASE, AMYLASE in the last 8760 hours. No results for input(s): AMMONIA in the last 8760 hours. CBC:  Recent Labs  07/27/15 1322 07/28/15 0356 07/29/15 0658 07/30/15 0707 07/31/15 0443 08/01/15 0312 08/08/15  WBC 12.0* 8.4 8.7 10.1 9.6 7.7 6.0  NEUTROABS 10.4* 6.5 7.2  --   --   --   --   HGB 12.2 10.3* 10.3* 9.7* 8.5* 8.5* 9.2*  HCT 36.8 32.1*  31.0* 29.4* 25.8* 26.1* 30*  MCV 89.1 89.9 90.9 88.6 88.7 90.3  --   PLT 209 194 178 198 211 262 492*   Cardiac Enzymes: No results for input(s): CKTOTAL, CKMB, CKMBINDEX, TROPONINI in the last 8760 hours. BNP: Invalid input(s): POCBNP CBG:  Recent Labs  07/31/15 2057 08/01/15 0935 08/01/15 1117  GLUCAP 143* 152* 140*    Procedures and Imaging Studies During Stay: Dg Chest 1 View  07/26/2015  CLINICAL DATA:   Left femoral neck fracture. Preoperative respiratory evaluation. EXAM: CHEST 1 VIEW COMPARISON:  08/04/2014 and earlier. FINDINGS: AP supine examination was performed. Suboptimal inspiration. Cardiac silhouette mildly to moderately enlarged, unchanged. Pulmonary venous hypertension with perhaps mild perihilar pulmonary edema. Lungs otherwise clear. No pleural effusions. IMPRESSION: Stable cardiomegaly.  Possible mild perihilar pulmonary edema. Electronically Signed   By: Evangeline Dakin M.D.   On: 07/26/2015 20:48   Ct Head Wo Contrast  07/26/2015  CLINICAL DATA:  Fall.  Trauma. EXAM: CT HEAD WITHOUT CONTRAST TECHNIQUE: Contiguous axial images were obtained from the base of the skull through the vertex without intravenous contrast. COMPARISON:  February 22, 2014 FINDINGS: No subdural, epidural, or subarachnoid hemorrhage. Cerebellum, brainstem, and basal cisterns are normal. Moderate white matter changes are stable. Ventricles and sulci are unchanged. No acute cortical ischemia or infarct. Basal ganglia calcifications are noted. IMPRESSION: No acute intracranial process. Electronically Signed   By: Dorise Bullion III M.D   On: 07/26/2015 20:43   Ct Cervical Spine Wo Contrast  07/26/2015  CLINICAL DATA:  Pain after fall. EXAM: CT CERVICAL SPINE WITHOUT CONTRAST TECHNIQUE: Multidetector CT imaging of the cervical spine was performed without intravenous contrast. Multiplanar CT image reconstructions were also generated. COMPARISON:  None. FINDINGS: No fracture or traumatic malalignment. 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 Multilevel degenerative changes are noted. IMPRESSION: No fracture or traumatic malalignment.  Degenerative change. Electronically Signed   By: Dorise Bullion III M.D   On: 07/26/2015 20:53   Dg Chest Port 1 View  07/27/2015  CLINICAL DATA:  Sepsis EXAM: PORTABLE CHEST 1 VIEW COMPARISON:  08/18/2015 FINDINGS: Chronic cardiomegaly and main pulmonary artery enlargement.  Interstitial coarsening that is similar to prior, without effusion or Kerley line. No convincing consolidation, limited behind the heart on this portable study. Symmetric biapical pleural thickening. IMPRESSION: No convincing pneumonia. Cardiomegaly and bronchitic or congestive interstitial coarsening. Electronically Signed   By: Monte Fantasia M.D.   On: 07/27/2015 13:59   Dg Hip Operative Unilat W Or W/o Pelvis Left  07/28/2015  CLINICAL DATA:  Z41.9 (ICD-10-CM) - Surgery, elective Fluoro 24 seconds EXAM: OPERATIVE LEFT HIP (WITH PELVIS IF PERFORMED) 2 VIEWS TECHNIQUE: Fluoroscopic spot image(s) were submitted for interpretation post-operatively. COMPARISON:  07/26/2015 FINDINGS: The patient has undergone left hip hemi arthroplasty. The femoral head component appears well seated in the acetabulum. No interval fracture. IMPRESSION: Status post left hip arthroplasty. Electronically Signed   By: Nolon Nations M.D.   On: 07/28/2015 13:08   Dg Hip Unilat With Pelvis 2-3 Views Left  07/26/2015  CLINICAL DATA:  82 year old nursing home patient who is unresponsive but was found to have a left hip fracture on a mobile x-ray examination earlier today. EXAM: DG HIP (WITH OR WITHOUT PELVIS) 2-3V LEFT COMPARISON:  None. The mobile x-ray is not available for comparison. FINDINGS: Acute comminuted subcapital left femoral neck fracture. Hip joint intact with mild joint space narrowing. Included AP pelvis demonstrates no fractures elsewhere. Symmetric mild joint space narrowing in the contralateral  right hip. Sacroiliac joints symphysis pubis intact. Degenerative changes involving the visualized lower lumbar spine. IMPRESSION: Acute comminuted subcapital left femoral neck fracture. Electronically Signed   By: Evangeline Dakin M.D.   On: 07/26/2015 20:47    Assessment/Plan:   Left hip Fracture Status post short term rehabilitation post hospital admission from 07/26/15-08/01/15 with left hip closed fracture. She  underwent left hip hemiarthroplasty on 07/28/15. Left hip surgical incision healed. She will be discharged to [facility] with Home health PT/OT to continue with ROM, Exercise, Gait stability and muscle strengthening. Follow up with Orthopedic as directed. Continue current pain regimen. CBC in 1-2 weeks with PCP   Dysphagia Has improved. Continue with Speech Therapist for swallowing evaluation. Continue mechanical soft diet with thin liquids. Aspiration precautions.   Hypothyroidism  Continue Levothyroxine 25 mcg Tablet . PCP to monitor TSH level.   Depression  Stable. Continue to monitor for mood changes. Continue on Zoloft 125 mg Tablet.   Generalized Anxiety  Stable. Continue on clonazepam 1 mg tablet at bedtime and 0.25 mg tablet daily as needed.  GERD Asymptomatic. Continue on omeprazole 20 mg Capsule. Consider changing to as needed if continues to be stable.   HTN B/P stable. Verapamil 240 mg CR Tablet. BMP in 1-2 weeks with PCP    Insomnia  Continue on Melatonin and Trazodone   Abnormal Gait  Discharge to  [facility] care centre with Home health PT/OT to continue with ROM, Exercise, Gait stability and muscle strengthening. She will require a Counsellor to allow her to maintain current level of independence with ADL's. She need a 3-1 for safe transfer to commode. She will also require a semi electric Hospital bed with half rails to assist with repositioning and tranfers in and out of the bed.Continue with Fall and safety precaution  Patient is being discharged with the following home health services:   PT/OT to continue with ROM, Exercise, Gait stability and muscle strengthening. Speech Therapist for swallowing evaluation   Patient is being discharged with the following durable medical equipment:    Front wheel Walker  A 3-1  A semi electric Hospital bed with half rails   Patient has been advised to f/u with their PCP in 1-2 weeks to bring  them up to date on their rehab stay.  Social services at facility was responsible for arranging this appointment.  Pt was provided with a 30 day supply of prescriptions for medications and refills must be obtained from their PCP.  For controlled substances, a more limited supply may be provided adequate until PCP appointment only.  Future labs/tests needed:  CBC , BMP  in 1-2 weeks with PCP

## 2015-08-19 DIAGNOSIS — Z96642 Presence of left artificial hip joint: Secondary | ICD-10-CM | POA: Diagnosis not present

## 2015-08-19 DIAGNOSIS — E119 Type 2 diabetes mellitus without complications: Secondary | ICD-10-CM | POA: Diagnosis not present

## 2015-08-19 DIAGNOSIS — S72002D Fracture of unspecified part of neck of left femur, subsequent encounter for closed fracture with routine healing: Secondary | ICD-10-CM | POA: Diagnosis not present

## 2015-08-19 DIAGNOSIS — W19XXXD Unspecified fall, subsequent encounter: Secondary | ICD-10-CM | POA: Diagnosis not present

## 2015-08-19 DIAGNOSIS — I447 Left bundle-branch block, unspecified: Secondary | ICD-10-CM | POA: Diagnosis not present

## 2015-08-19 DIAGNOSIS — G309 Alzheimer's disease, unspecified: Secondary | ICD-10-CM | POA: Diagnosis not present

## 2015-08-19 DIAGNOSIS — J449 Chronic obstructive pulmonary disease, unspecified: Secondary | ICD-10-CM | POA: Diagnosis not present

## 2015-08-19 DIAGNOSIS — I10 Essential (primary) hypertension: Secondary | ICD-10-CM | POA: Diagnosis not present

## 2015-08-19 DIAGNOSIS — J8410 Pulmonary fibrosis, unspecified: Secondary | ICD-10-CM | POA: Diagnosis not present

## 2015-08-19 DIAGNOSIS — Z9181 History of falling: Secondary | ICD-10-CM | POA: Diagnosis not present

## 2015-08-22 DIAGNOSIS — J8410 Pulmonary fibrosis, unspecified: Secondary | ICD-10-CM | POA: Diagnosis not present

## 2015-08-22 DIAGNOSIS — I10 Essential (primary) hypertension: Secondary | ICD-10-CM | POA: Diagnosis not present

## 2015-08-22 DIAGNOSIS — Z9181 History of falling: Secondary | ICD-10-CM | POA: Diagnosis not present

## 2015-08-22 DIAGNOSIS — S72002D Fracture of unspecified part of neck of left femur, subsequent encounter for closed fracture with routine healing: Secondary | ICD-10-CM | POA: Diagnosis not present

## 2015-08-22 DIAGNOSIS — G309 Alzheimer's disease, unspecified: Secondary | ICD-10-CM | POA: Diagnosis not present

## 2015-08-22 DIAGNOSIS — W19XXXD Unspecified fall, subsequent encounter: Secondary | ICD-10-CM | POA: Diagnosis not present

## 2015-08-22 DIAGNOSIS — I447 Left bundle-branch block, unspecified: Secondary | ICD-10-CM | POA: Diagnosis not present

## 2015-08-22 DIAGNOSIS — Z96642 Presence of left artificial hip joint: Secondary | ICD-10-CM | POA: Diagnosis not present

## 2015-08-22 DIAGNOSIS — J449 Chronic obstructive pulmonary disease, unspecified: Secondary | ICD-10-CM | POA: Diagnosis not present

## 2015-08-22 DIAGNOSIS — E119 Type 2 diabetes mellitus without complications: Secondary | ICD-10-CM | POA: Diagnosis not present

## 2015-08-23 DIAGNOSIS — J309 Allergic rhinitis, unspecified: Secondary | ICD-10-CM | POA: Diagnosis not present

## 2015-08-23 DIAGNOSIS — W19XXXD Unspecified fall, subsequent encounter: Secondary | ICD-10-CM | POA: Diagnosis not present

## 2015-08-23 DIAGNOSIS — K219 Gastro-esophageal reflux disease without esophagitis: Secondary | ICD-10-CM | POA: Diagnosis not present

## 2015-08-24 DIAGNOSIS — E119 Type 2 diabetes mellitus without complications: Secondary | ICD-10-CM | POA: Diagnosis not present

## 2015-08-24 DIAGNOSIS — Z9181 History of falling: Secondary | ICD-10-CM | POA: Diagnosis not present

## 2015-08-24 DIAGNOSIS — Z96642 Presence of left artificial hip joint: Secondary | ICD-10-CM | POA: Diagnosis not present

## 2015-08-24 DIAGNOSIS — W19XXXD Unspecified fall, subsequent encounter: Secondary | ICD-10-CM | POA: Diagnosis not present

## 2015-08-24 DIAGNOSIS — J449 Chronic obstructive pulmonary disease, unspecified: Secondary | ICD-10-CM | POA: Diagnosis not present

## 2015-08-24 DIAGNOSIS — I10 Essential (primary) hypertension: Secondary | ICD-10-CM | POA: Diagnosis not present

## 2015-08-24 DIAGNOSIS — I447 Left bundle-branch block, unspecified: Secondary | ICD-10-CM | POA: Diagnosis not present

## 2015-08-24 DIAGNOSIS — G309 Alzheimer's disease, unspecified: Secondary | ICD-10-CM | POA: Diagnosis not present

## 2015-08-24 DIAGNOSIS — J8410 Pulmonary fibrosis, unspecified: Secondary | ICD-10-CM | POA: Diagnosis not present

## 2015-08-24 DIAGNOSIS — S72002D Fracture of unspecified part of neck of left femur, subsequent encounter for closed fracture with routine healing: Secondary | ICD-10-CM | POA: Diagnosis not present

## 2015-08-26 DIAGNOSIS — J8410 Pulmonary fibrosis, unspecified: Secondary | ICD-10-CM | POA: Diagnosis not present

## 2015-08-26 DIAGNOSIS — G309 Alzheimer's disease, unspecified: Secondary | ICD-10-CM | POA: Diagnosis not present

## 2015-08-26 DIAGNOSIS — Z9181 History of falling: Secondary | ICD-10-CM | POA: Diagnosis not present

## 2015-08-26 DIAGNOSIS — I10 Essential (primary) hypertension: Secondary | ICD-10-CM | POA: Diagnosis not present

## 2015-08-26 DIAGNOSIS — Z96642 Presence of left artificial hip joint: Secondary | ICD-10-CM | POA: Diagnosis not present

## 2015-08-26 DIAGNOSIS — I447 Left bundle-branch block, unspecified: Secondary | ICD-10-CM | POA: Diagnosis not present

## 2015-08-26 DIAGNOSIS — E119 Type 2 diabetes mellitus without complications: Secondary | ICD-10-CM | POA: Diagnosis not present

## 2015-08-26 DIAGNOSIS — W19XXXD Unspecified fall, subsequent encounter: Secondary | ICD-10-CM | POA: Diagnosis not present

## 2015-08-26 DIAGNOSIS — J449 Chronic obstructive pulmonary disease, unspecified: Secondary | ICD-10-CM | POA: Diagnosis not present

## 2015-08-26 DIAGNOSIS — S72002D Fracture of unspecified part of neck of left femur, subsequent encounter for closed fracture with routine healing: Secondary | ICD-10-CM | POA: Diagnosis not present

## 2015-08-29 DIAGNOSIS — Z9181 History of falling: Secondary | ICD-10-CM | POA: Diagnosis not present

## 2015-08-29 DIAGNOSIS — I447 Left bundle-branch block, unspecified: Secondary | ICD-10-CM | POA: Diagnosis not present

## 2015-08-29 DIAGNOSIS — G309 Alzheimer's disease, unspecified: Secondary | ICD-10-CM | POA: Diagnosis not present

## 2015-08-29 DIAGNOSIS — I10 Essential (primary) hypertension: Secondary | ICD-10-CM | POA: Diagnosis not present

## 2015-08-29 DIAGNOSIS — S72002D Fracture of unspecified part of neck of left femur, subsequent encounter for closed fracture with routine healing: Secondary | ICD-10-CM | POA: Diagnosis not present

## 2015-08-29 DIAGNOSIS — J8410 Pulmonary fibrosis, unspecified: Secondary | ICD-10-CM | POA: Diagnosis not present

## 2015-08-29 DIAGNOSIS — W19XXXD Unspecified fall, subsequent encounter: Secondary | ICD-10-CM | POA: Diagnosis not present

## 2015-08-29 DIAGNOSIS — Z96642 Presence of left artificial hip joint: Secondary | ICD-10-CM | POA: Diagnosis not present

## 2015-08-29 DIAGNOSIS — E119 Type 2 diabetes mellitus without complications: Secondary | ICD-10-CM | POA: Diagnosis not present

## 2015-08-29 DIAGNOSIS — J449 Chronic obstructive pulmonary disease, unspecified: Secondary | ICD-10-CM | POA: Diagnosis not present

## 2015-08-30 DIAGNOSIS — W19XXXD Unspecified fall, subsequent encounter: Secondary | ICD-10-CM | POA: Diagnosis not present

## 2015-08-30 DIAGNOSIS — Z9181 History of falling: Secondary | ICD-10-CM | POA: Diagnosis not present

## 2015-08-30 DIAGNOSIS — E119 Type 2 diabetes mellitus without complications: Secondary | ICD-10-CM | POA: Diagnosis not present

## 2015-08-30 DIAGNOSIS — S72002D Fracture of unspecified part of neck of left femur, subsequent encounter for closed fracture with routine healing: Secondary | ICD-10-CM | POA: Diagnosis not present

## 2015-08-30 DIAGNOSIS — M84459S Pathological fracture, hip, unspecified, sequela: Secondary | ICD-10-CM | POA: Diagnosis not present

## 2015-08-30 DIAGNOSIS — G309 Alzheimer's disease, unspecified: Secondary | ICD-10-CM | POA: Diagnosis not present

## 2015-08-30 DIAGNOSIS — E039 Hypothyroidism, unspecified: Secondary | ICD-10-CM | POA: Diagnosis not present

## 2015-08-30 DIAGNOSIS — Z96642 Presence of left artificial hip joint: Secondary | ICD-10-CM | POA: Diagnosis not present

## 2015-08-30 DIAGNOSIS — E118 Type 2 diabetes mellitus with unspecified complications: Secondary | ICD-10-CM | POA: Diagnosis not present

## 2015-08-30 DIAGNOSIS — J8410 Pulmonary fibrosis, unspecified: Secondary | ICD-10-CM | POA: Diagnosis not present

## 2015-08-30 DIAGNOSIS — J449 Chronic obstructive pulmonary disease, unspecified: Secondary | ICD-10-CM | POA: Diagnosis not present

## 2015-08-30 DIAGNOSIS — I10 Essential (primary) hypertension: Secondary | ICD-10-CM | POA: Diagnosis not present

## 2015-08-30 DIAGNOSIS — R600 Localized edema: Secondary | ICD-10-CM | POA: Diagnosis not present

## 2015-08-30 DIAGNOSIS — I447 Left bundle-branch block, unspecified: Secondary | ICD-10-CM | POA: Diagnosis not present

## 2015-09-01 DIAGNOSIS — J8410 Pulmonary fibrosis, unspecified: Secondary | ICD-10-CM | POA: Diagnosis not present

## 2015-09-01 DIAGNOSIS — I447 Left bundle-branch block, unspecified: Secondary | ICD-10-CM | POA: Diagnosis not present

## 2015-09-01 DIAGNOSIS — E119 Type 2 diabetes mellitus without complications: Secondary | ICD-10-CM | POA: Diagnosis not present

## 2015-09-01 DIAGNOSIS — G309 Alzheimer's disease, unspecified: Secondary | ICD-10-CM | POA: Diagnosis not present

## 2015-09-01 DIAGNOSIS — I10 Essential (primary) hypertension: Secondary | ICD-10-CM | POA: Diagnosis not present

## 2015-09-01 DIAGNOSIS — W19XXXD Unspecified fall, subsequent encounter: Secondary | ICD-10-CM | POA: Diagnosis not present

## 2015-09-01 DIAGNOSIS — Z9181 History of falling: Secondary | ICD-10-CM | POA: Diagnosis not present

## 2015-09-01 DIAGNOSIS — Z96642 Presence of left artificial hip joint: Secondary | ICD-10-CM | POA: Diagnosis not present

## 2015-09-01 DIAGNOSIS — S72002D Fracture of unspecified part of neck of left femur, subsequent encounter for closed fracture with routine healing: Secondary | ICD-10-CM | POA: Diagnosis not present

## 2015-09-01 DIAGNOSIS — J449 Chronic obstructive pulmonary disease, unspecified: Secondary | ICD-10-CM | POA: Diagnosis not present

## 2015-09-02 DIAGNOSIS — J8410 Pulmonary fibrosis, unspecified: Secondary | ICD-10-CM | POA: Diagnosis not present

## 2015-09-02 DIAGNOSIS — Z9181 History of falling: Secondary | ICD-10-CM | POA: Diagnosis not present

## 2015-09-02 DIAGNOSIS — E039 Hypothyroidism, unspecified: Secondary | ICD-10-CM | POA: Diagnosis not present

## 2015-09-02 DIAGNOSIS — I10 Essential (primary) hypertension: Secondary | ICD-10-CM | POA: Diagnosis not present

## 2015-09-02 DIAGNOSIS — E119 Type 2 diabetes mellitus without complications: Secondary | ICD-10-CM | POA: Diagnosis not present

## 2015-09-02 DIAGNOSIS — W19XXXD Unspecified fall, subsequent encounter: Secondary | ICD-10-CM | POA: Diagnosis not present

## 2015-09-02 DIAGNOSIS — E784 Other hyperlipidemia: Secondary | ICD-10-CM | POA: Diagnosis not present

## 2015-09-02 DIAGNOSIS — G309 Alzheimer's disease, unspecified: Secondary | ICD-10-CM | POA: Diagnosis not present

## 2015-09-02 DIAGNOSIS — Z96642 Presence of left artificial hip joint: Secondary | ICD-10-CM | POA: Diagnosis not present

## 2015-09-02 DIAGNOSIS — I447 Left bundle-branch block, unspecified: Secondary | ICD-10-CM | POA: Diagnosis not present

## 2015-09-02 DIAGNOSIS — J449 Chronic obstructive pulmonary disease, unspecified: Secondary | ICD-10-CM | POA: Diagnosis not present

## 2015-09-02 DIAGNOSIS — S72002D Fracture of unspecified part of neck of left femur, subsequent encounter for closed fracture with routine healing: Secondary | ICD-10-CM | POA: Diagnosis not present

## 2015-09-02 DIAGNOSIS — E118 Type 2 diabetes mellitus with unspecified complications: Secondary | ICD-10-CM | POA: Diagnosis not present

## 2015-09-05 DIAGNOSIS — J8410 Pulmonary fibrosis, unspecified: Secondary | ICD-10-CM | POA: Diagnosis not present

## 2015-09-05 DIAGNOSIS — Z96642 Presence of left artificial hip joint: Secondary | ICD-10-CM | POA: Diagnosis not present

## 2015-09-05 DIAGNOSIS — W19XXXD Unspecified fall, subsequent encounter: Secondary | ICD-10-CM | POA: Diagnosis not present

## 2015-09-05 DIAGNOSIS — E119 Type 2 diabetes mellitus without complications: Secondary | ICD-10-CM | POA: Diagnosis not present

## 2015-09-05 DIAGNOSIS — I447 Left bundle-branch block, unspecified: Secondary | ICD-10-CM | POA: Diagnosis not present

## 2015-09-05 DIAGNOSIS — Z9181 History of falling: Secondary | ICD-10-CM | POA: Diagnosis not present

## 2015-09-05 DIAGNOSIS — G309 Alzheimer's disease, unspecified: Secondary | ICD-10-CM | POA: Diagnosis not present

## 2015-09-05 DIAGNOSIS — S72002D Fracture of unspecified part of neck of left femur, subsequent encounter for closed fracture with routine healing: Secondary | ICD-10-CM | POA: Diagnosis not present

## 2015-09-05 DIAGNOSIS — I10 Essential (primary) hypertension: Secondary | ICD-10-CM | POA: Diagnosis not present

## 2015-09-05 DIAGNOSIS — J449 Chronic obstructive pulmonary disease, unspecified: Secondary | ICD-10-CM | POA: Diagnosis not present

## 2015-09-06 DIAGNOSIS — J449 Chronic obstructive pulmonary disease, unspecified: Secondary | ICD-10-CM | POA: Diagnosis not present

## 2015-09-06 DIAGNOSIS — E119 Type 2 diabetes mellitus without complications: Secondary | ICD-10-CM | POA: Diagnosis not present

## 2015-09-06 DIAGNOSIS — J8410 Pulmonary fibrosis, unspecified: Secondary | ICD-10-CM | POA: Diagnosis not present

## 2015-09-06 DIAGNOSIS — W19XXXD Unspecified fall, subsequent encounter: Secondary | ICD-10-CM | POA: Diagnosis not present

## 2015-09-06 DIAGNOSIS — I10 Essential (primary) hypertension: Secondary | ICD-10-CM | POA: Diagnosis not present

## 2015-09-06 DIAGNOSIS — I447 Left bundle-branch block, unspecified: Secondary | ICD-10-CM | POA: Diagnosis not present

## 2015-09-06 DIAGNOSIS — Z9181 History of falling: Secondary | ICD-10-CM | POA: Diagnosis not present

## 2015-09-06 DIAGNOSIS — S72002D Fracture of unspecified part of neck of left femur, subsequent encounter for closed fracture with routine healing: Secondary | ICD-10-CM | POA: Diagnosis not present

## 2015-09-06 DIAGNOSIS — Z96642 Presence of left artificial hip joint: Secondary | ICD-10-CM | POA: Diagnosis not present

## 2015-09-06 DIAGNOSIS — G309 Alzheimer's disease, unspecified: Secondary | ICD-10-CM | POA: Diagnosis not present

## 2015-09-07 DIAGNOSIS — S72001D Fracture of unspecified part of neck of right femur, subsequent encounter for closed fracture with routine healing: Secondary | ICD-10-CM | POA: Diagnosis not present

## 2015-09-09 DIAGNOSIS — J8410 Pulmonary fibrosis, unspecified: Secondary | ICD-10-CM | POA: Diagnosis not present

## 2015-09-09 DIAGNOSIS — S72002D Fracture of unspecified part of neck of left femur, subsequent encounter for closed fracture with routine healing: Secondary | ICD-10-CM | POA: Diagnosis not present

## 2015-09-09 DIAGNOSIS — Z9181 History of falling: Secondary | ICD-10-CM | POA: Diagnosis not present

## 2015-09-09 DIAGNOSIS — G309 Alzheimer's disease, unspecified: Secondary | ICD-10-CM | POA: Diagnosis not present

## 2015-09-09 DIAGNOSIS — J449 Chronic obstructive pulmonary disease, unspecified: Secondary | ICD-10-CM | POA: Diagnosis not present

## 2015-09-09 DIAGNOSIS — I447 Left bundle-branch block, unspecified: Secondary | ICD-10-CM | POA: Diagnosis not present

## 2015-09-09 DIAGNOSIS — W19XXXD Unspecified fall, subsequent encounter: Secondary | ICD-10-CM | POA: Diagnosis not present

## 2015-09-09 DIAGNOSIS — I10 Essential (primary) hypertension: Secondary | ICD-10-CM | POA: Diagnosis not present

## 2015-09-09 DIAGNOSIS — Z96642 Presence of left artificial hip joint: Secondary | ICD-10-CM | POA: Diagnosis not present

## 2015-09-09 DIAGNOSIS — E119 Type 2 diabetes mellitus without complications: Secondary | ICD-10-CM | POA: Diagnosis not present

## 2015-09-12 DIAGNOSIS — Z96642 Presence of left artificial hip joint: Secondary | ICD-10-CM | POA: Diagnosis not present

## 2015-09-12 DIAGNOSIS — I10 Essential (primary) hypertension: Secondary | ICD-10-CM | POA: Diagnosis not present

## 2015-09-12 DIAGNOSIS — Z9181 History of falling: Secondary | ICD-10-CM | POA: Diagnosis not present

## 2015-09-12 DIAGNOSIS — E119 Type 2 diabetes mellitus without complications: Secondary | ICD-10-CM | POA: Diagnosis not present

## 2015-09-12 DIAGNOSIS — J449 Chronic obstructive pulmonary disease, unspecified: Secondary | ICD-10-CM | POA: Diagnosis not present

## 2015-09-12 DIAGNOSIS — S72002D Fracture of unspecified part of neck of left femur, subsequent encounter for closed fracture with routine healing: Secondary | ICD-10-CM | POA: Diagnosis not present

## 2015-09-12 DIAGNOSIS — I447 Left bundle-branch block, unspecified: Secondary | ICD-10-CM | POA: Diagnosis not present

## 2015-09-12 DIAGNOSIS — J8410 Pulmonary fibrosis, unspecified: Secondary | ICD-10-CM | POA: Diagnosis not present

## 2015-09-12 DIAGNOSIS — W19XXXD Unspecified fall, subsequent encounter: Secondary | ICD-10-CM | POA: Diagnosis not present

## 2015-09-12 DIAGNOSIS — G309 Alzheimer's disease, unspecified: Secondary | ICD-10-CM | POA: Diagnosis not present

## 2015-09-14 DIAGNOSIS — J449 Chronic obstructive pulmonary disease, unspecified: Secondary | ICD-10-CM | POA: Diagnosis not present

## 2015-09-14 DIAGNOSIS — I447 Left bundle-branch block, unspecified: Secondary | ICD-10-CM | POA: Diagnosis not present

## 2015-09-14 DIAGNOSIS — J8410 Pulmonary fibrosis, unspecified: Secondary | ICD-10-CM | POA: Diagnosis not present

## 2015-09-14 DIAGNOSIS — I10 Essential (primary) hypertension: Secondary | ICD-10-CM | POA: Diagnosis not present

## 2015-09-14 DIAGNOSIS — S72002D Fracture of unspecified part of neck of left femur, subsequent encounter for closed fracture with routine healing: Secondary | ICD-10-CM | POA: Diagnosis not present

## 2015-09-14 DIAGNOSIS — E119 Type 2 diabetes mellitus without complications: Secondary | ICD-10-CM | POA: Diagnosis not present

## 2015-09-14 DIAGNOSIS — W19XXXD Unspecified fall, subsequent encounter: Secondary | ICD-10-CM | POA: Diagnosis not present

## 2015-09-14 DIAGNOSIS — Z9181 History of falling: Secondary | ICD-10-CM | POA: Diagnosis not present

## 2015-09-14 DIAGNOSIS — G309 Alzheimer's disease, unspecified: Secondary | ICD-10-CM | POA: Diagnosis not present

## 2015-09-14 DIAGNOSIS — Z96642 Presence of left artificial hip joint: Secondary | ICD-10-CM | POA: Diagnosis not present

## 2015-09-17 DIAGNOSIS — J449 Chronic obstructive pulmonary disease, unspecified: Secondary | ICD-10-CM | POA: Diagnosis not present

## 2015-09-17 DIAGNOSIS — R1312 Dysphagia, oropharyngeal phase: Secondary | ICD-10-CM | POA: Diagnosis not present

## 2015-09-19 DIAGNOSIS — Z96642 Presence of left artificial hip joint: Secondary | ICD-10-CM | POA: Diagnosis not present

## 2015-09-19 DIAGNOSIS — Z9181 History of falling: Secondary | ICD-10-CM | POA: Diagnosis not present

## 2015-09-19 DIAGNOSIS — G309 Alzheimer's disease, unspecified: Secondary | ICD-10-CM | POA: Diagnosis not present

## 2015-09-19 DIAGNOSIS — J8410 Pulmonary fibrosis, unspecified: Secondary | ICD-10-CM | POA: Diagnosis not present

## 2015-09-19 DIAGNOSIS — E119 Type 2 diabetes mellitus without complications: Secondary | ICD-10-CM | POA: Diagnosis not present

## 2015-09-19 DIAGNOSIS — W19XXXD Unspecified fall, subsequent encounter: Secondary | ICD-10-CM | POA: Diagnosis not present

## 2015-09-19 DIAGNOSIS — I10 Essential (primary) hypertension: Secondary | ICD-10-CM | POA: Diagnosis not present

## 2015-09-19 DIAGNOSIS — J449 Chronic obstructive pulmonary disease, unspecified: Secondary | ICD-10-CM | POA: Diagnosis not present

## 2015-09-19 DIAGNOSIS — I447 Left bundle-branch block, unspecified: Secondary | ICD-10-CM | POA: Diagnosis not present

## 2015-09-19 DIAGNOSIS — S72002D Fracture of unspecified part of neck of left femur, subsequent encounter for closed fracture with routine healing: Secondary | ICD-10-CM | POA: Diagnosis not present

## 2015-09-21 DIAGNOSIS — Z96642 Presence of left artificial hip joint: Secondary | ICD-10-CM | POA: Diagnosis not present

## 2015-09-21 DIAGNOSIS — W19XXXD Unspecified fall, subsequent encounter: Secondary | ICD-10-CM | POA: Diagnosis not present

## 2015-09-21 DIAGNOSIS — G309 Alzheimer's disease, unspecified: Secondary | ICD-10-CM | POA: Diagnosis not present

## 2015-09-21 DIAGNOSIS — J8410 Pulmonary fibrosis, unspecified: Secondary | ICD-10-CM | POA: Diagnosis not present

## 2015-09-21 DIAGNOSIS — Z9181 History of falling: Secondary | ICD-10-CM | POA: Diagnosis not present

## 2015-09-21 DIAGNOSIS — S72002D Fracture of unspecified part of neck of left femur, subsequent encounter for closed fracture with routine healing: Secondary | ICD-10-CM | POA: Diagnosis not present

## 2015-09-21 DIAGNOSIS — I447 Left bundle-branch block, unspecified: Secondary | ICD-10-CM | POA: Diagnosis not present

## 2015-09-21 DIAGNOSIS — E119 Type 2 diabetes mellitus without complications: Secondary | ICD-10-CM | POA: Diagnosis not present

## 2015-09-21 DIAGNOSIS — J449 Chronic obstructive pulmonary disease, unspecified: Secondary | ICD-10-CM | POA: Diagnosis not present

## 2015-09-21 DIAGNOSIS — I10 Essential (primary) hypertension: Secondary | ICD-10-CM | POA: Diagnosis not present

## 2015-09-27 DIAGNOSIS — R600 Localized edema: Secondary | ICD-10-CM | POA: Diagnosis not present

## 2015-09-27 DIAGNOSIS — H6121 Impacted cerumen, right ear: Secondary | ICD-10-CM | POA: Diagnosis not present

## 2015-09-27 DIAGNOSIS — E118 Type 2 diabetes mellitus with unspecified complications: Secondary | ICD-10-CM | POA: Diagnosis not present

## 2015-09-27 DIAGNOSIS — I10 Essential (primary) hypertension: Secondary | ICD-10-CM | POA: Diagnosis not present

## 2015-09-27 DIAGNOSIS — E039 Hypothyroidism, unspecified: Secondary | ICD-10-CM | POA: Diagnosis not present

## 2015-09-28 ENCOUNTER — Other Ambulatory Visit: Payer: Self-pay | Admitting: Pharmacist

## 2015-09-28 NOTE — Patient Outreach (Signed)
Receive a call back from patient's son, [name] [name]. [name] reports that [name] is currently in a memory care facility. [name] states that he is his mother's power of attorney.  Reports that he has no questions or concerns about his mother's medications at this time.   [name], PharmD Clinical Pharmacist [facility] [phone]

## 2015-09-28 NOTE — Patient Outreach (Signed)
Outreach call to Helmut Muster regarding her request for follow up from the [facility] Medication Adherence Campaign. Patient does not answer.  [name], PharmD Clinical Pharmacist [facility] [phone]

## 2015-10-11 NOTE — Consult Note (Signed)
Patient was seen for consultation on 6/27 and consult note was written on 07/27/15   [name] [name]

## 2015-10-18 DIAGNOSIS — I10 Essential (primary) hypertension: Secondary | ICD-10-CM | POA: Diagnosis not present

## 2015-10-18 DIAGNOSIS — R1312 Dysphagia, oropharyngeal phase: Secondary | ICD-10-CM | POA: Diagnosis not present

## 2015-10-18 DIAGNOSIS — J449 Chronic obstructive pulmonary disease, unspecified: Secondary | ICD-10-CM | POA: Diagnosis not present

## 2015-10-18 DIAGNOSIS — K5900 Constipation, unspecified: Secondary | ICD-10-CM | POA: Diagnosis not present

## 2015-10-18 DIAGNOSIS — H6121 Impacted cerumen, right ear: Secondary | ICD-10-CM | POA: Diagnosis not present

## 2015-10-18 DIAGNOSIS — E039 Hypothyroidism, unspecified: Secondary | ICD-10-CM | POA: Diagnosis not present

## 2015-10-19 DIAGNOSIS — N390 Urinary tract infection, site not specified: Secondary | ICD-10-CM | POA: Diagnosis not present

## 2015-10-27 DIAGNOSIS — G301 Alzheimer's disease with late onset: Secondary | ICD-10-CM | POA: Diagnosis not present

## 2015-11-17 DIAGNOSIS — R1312 Dysphagia, oropharyngeal phase: Secondary | ICD-10-CM | POA: Diagnosis not present

## 2015-11-17 DIAGNOSIS — J449 Chronic obstructive pulmonary disease, unspecified: Secondary | ICD-10-CM | POA: Diagnosis not present

## 2015-11-22 DIAGNOSIS — M84459S Pathological fracture, hip, unspecified, sequela: Secondary | ICD-10-CM | POA: Diagnosis not present

## 2015-11-22 DIAGNOSIS — E039 Hypothyroidism, unspecified: Secondary | ICD-10-CM | POA: Diagnosis not present

## 2015-11-22 DIAGNOSIS — E118 Type 2 diabetes mellitus with unspecified complications: Secondary | ICD-10-CM | POA: Diagnosis not present

## 2015-11-22 DIAGNOSIS — K5900 Constipation, unspecified: Secondary | ICD-10-CM | POA: Diagnosis not present

## 2015-11-29 DIAGNOSIS — G301 Alzheimer's disease with late onset: Secondary | ICD-10-CM | POA: Diagnosis not present

## 2015-12-06 DIAGNOSIS — Z7189 Other specified counseling: Secondary | ICD-10-CM | POA: Diagnosis not present

## 2015-12-13 DIAGNOSIS — Z79899 Other long term (current) drug therapy: Secondary | ICD-10-CM | POA: Diagnosis not present

## 2015-12-18 DIAGNOSIS — J449 Chronic obstructive pulmonary disease, unspecified: Secondary | ICD-10-CM | POA: Diagnosis not present

## 2015-12-18 DIAGNOSIS — R1312 Dysphagia, oropharyngeal phase: Secondary | ICD-10-CM | POA: Diagnosis not present

## 2015-12-20 DIAGNOSIS — E118 Type 2 diabetes mellitus with unspecified complications: Secondary | ICD-10-CM | POA: Diagnosis not present

## 2015-12-20 DIAGNOSIS — K5900 Constipation, unspecified: Secondary | ICD-10-CM | POA: Diagnosis not present

## 2015-12-20 DIAGNOSIS — M84459S Pathological fracture, hip, unspecified, sequela: Secondary | ICD-10-CM | POA: Diagnosis not present

## 2015-12-20 DIAGNOSIS — E039 Hypothyroidism, unspecified: Secondary | ICD-10-CM | POA: Diagnosis not present

## 2015-12-20 DIAGNOSIS — H6121 Impacted cerumen, right ear: Secondary | ICD-10-CM | POA: Diagnosis not present

## 2015-12-23 DIAGNOSIS — Z0001 Encounter for general adult medical examination with abnormal findings: Secondary | ICD-10-CM | POA: Diagnosis not present

## 2015-12-27 DIAGNOSIS — H6121 Impacted cerumen, right ear: Secondary | ICD-10-CM | POA: Diagnosis not present

## 2015-12-29 DIAGNOSIS — G301 Alzheimer's disease with late onset: Secondary | ICD-10-CM | POA: Diagnosis not present

## 2016-01-04 DIAGNOSIS — Z79899 Other long term (current) drug therapy: Secondary | ICD-10-CM | POA: Diagnosis not present

## 2016-01-17 DIAGNOSIS — J449 Chronic obstructive pulmonary disease, unspecified: Secondary | ICD-10-CM | POA: Diagnosis not present

## 2016-01-17 DIAGNOSIS — R1312 Dysphagia, oropharyngeal phase: Secondary | ICD-10-CM | POA: Diagnosis not present

## 2016-01-24 DIAGNOSIS — I10 Essential (primary) hypertension: Secondary | ICD-10-CM | POA: Diagnosis not present

## 2016-01-24 DIAGNOSIS — E784 Other hyperlipidemia: Secondary | ICD-10-CM | POA: Diagnosis not present

## 2016-01-24 DIAGNOSIS — E039 Hypothyroidism, unspecified: Secondary | ICD-10-CM | POA: Diagnosis not present

## 2016-01-24 DIAGNOSIS — K5900 Constipation, unspecified: Secondary | ICD-10-CM | POA: Diagnosis not present

## 2016-01-24 DIAGNOSIS — E118 Type 2 diabetes mellitus with unspecified complications: Secondary | ICD-10-CM | POA: Diagnosis not present

## 2016-01-25 DIAGNOSIS — H35033 Hypertensive retinopathy, bilateral: Secondary | ICD-10-CM | POA: Diagnosis not present

## 2016-01-25 DIAGNOSIS — E1100 Type 2 diabetes mellitus with hyperosmolarity without nonketotic hyperglycemic-hyperosmolar coma (NKHHC): Secondary | ICD-10-CM | POA: Diagnosis not present

## 2016-01-25 DIAGNOSIS — E11319 Type 2 diabetes mellitus with unspecified diabetic retinopathy without macular edema: Secondary | ICD-10-CM | POA: Diagnosis not present

## 2016-01-25 DIAGNOSIS — H353131 Nonexudative age-related macular degeneration, bilateral, early dry stage: Secondary | ICD-10-CM | POA: Diagnosis not present

## 2016-01-25 DIAGNOSIS — H18413 Arcus senilis, bilateral: Secondary | ICD-10-CM | POA: Diagnosis not present

## 2016-01-28 DIAGNOSIS — G301 Alzheimer's disease with late onset: Secondary | ICD-10-CM | POA: Diagnosis not present

## 2016-02-03 DIAGNOSIS — K5900 Constipation, unspecified: Secondary | ICD-10-CM | POA: Diagnosis not present

## 2016-02-03 DIAGNOSIS — E784 Other hyperlipidemia: Secondary | ICD-10-CM | POA: Diagnosis not present

## 2016-02-03 DIAGNOSIS — I10 Essential (primary) hypertension: Secondary | ICD-10-CM | POA: Diagnosis not present

## 2016-02-03 DIAGNOSIS — E118 Type 2 diabetes mellitus with unspecified complications: Secondary | ICD-10-CM | POA: Diagnosis not present

## 2016-02-03 DIAGNOSIS — E039 Hypothyroidism, unspecified: Secondary | ICD-10-CM | POA: Diagnosis not present

## 2016-02-09 ENCOUNTER — Emergency Department (HOSPITAL_COMMUNITY)
Admission: EM | Admit: 2016-02-09 | Discharge: 2016-02-09 | Disposition: A | Discharge status: Home / Self Care | Payer: Medicare Other | Attending: Emergency Medicine | Admitting: Emergency Medicine

## 2016-02-09 ENCOUNTER — Encounter (HOSPITAL_COMMUNITY): Payer: Self-pay

## 2016-02-09 ENCOUNTER — Emergency Department (HOSPITAL_COMMUNITY): Payer: Medicare Other

## 2016-02-09 DIAGNOSIS — E039 Hypothyroidism, unspecified: Secondary | ICD-10-CM | POA: Diagnosis not present

## 2016-02-09 DIAGNOSIS — I129 Hypertensive chronic kidney disease with stage 1 through stage 4 chronic kidney disease, or unspecified chronic kidney disease: Secondary | ICD-10-CM | POA: Diagnosis not present

## 2016-02-09 DIAGNOSIS — Y999 Unspecified external cause status: Secondary | ICD-10-CM | POA: Diagnosis not present

## 2016-02-09 DIAGNOSIS — N183 Chronic kidney disease, stage 3 (moderate): Secondary | ICD-10-CM | POA: Diagnosis not present

## 2016-02-09 DIAGNOSIS — S80212A Abrasion, left knee, initial encounter: Secondary | ICD-10-CM | POA: Insufficient documentation

## 2016-02-09 DIAGNOSIS — W19XXXA Unspecified fall, initial encounter: Secondary | ICD-10-CM

## 2016-02-09 DIAGNOSIS — S0990XA Unspecified injury of head, initial encounter: Secondary | ICD-10-CM | POA: Diagnosis not present

## 2016-02-09 DIAGNOSIS — S0083XA Contusion of other part of head, initial encounter: Secondary | ICD-10-CM | POA: Insufficient documentation

## 2016-02-09 DIAGNOSIS — W050XXA Fall from non-moving wheelchair, initial encounter: Secondary | ICD-10-CM | POA: Insufficient documentation

## 2016-02-09 DIAGNOSIS — E1122 Type 2 diabetes mellitus with diabetic chronic kidney disease: Secondary | ICD-10-CM | POA: Insufficient documentation

## 2016-02-09 DIAGNOSIS — J449 Chronic obstructive pulmonary disease, unspecified: Secondary | ICD-10-CM | POA: Insufficient documentation

## 2016-02-09 DIAGNOSIS — Y939 Activity, unspecified: Secondary | ICD-10-CM | POA: Insufficient documentation

## 2016-02-09 DIAGNOSIS — Z96642 Presence of left artificial hip joint: Secondary | ICD-10-CM | POA: Insufficient documentation

## 2016-02-09 DIAGNOSIS — Y929 Unspecified place or not applicable: Secondary | ICD-10-CM | POA: Insufficient documentation

## 2016-02-09 DIAGNOSIS — S064X0A Epidural hemorrhage without loss of consciousness, initial encounter: Secondary | ICD-10-CM | POA: Diagnosis not present

## 2016-02-09 DIAGNOSIS — S0003XA Contusion of scalp, initial encounter: Secondary | ICD-10-CM | POA: Diagnosis not present

## 2016-02-09 NOTE — ED Triage Notes (Signed)
Pt. Coming from skilled memory care unit for fall out of wheelchair today. EMS reports no LOC, but patient did hit her head. EMS notes a hematoma to left back of head. Facility reports pt. At baseline. EMS reports no blood thinners. Pt. In c-collar just as a precaution. Pt. Has no complaints.

## 2016-02-09 NOTE — ED Provider Notes (Signed)
[facility] DEPT Provider Note   CSN: [id number] Arrival date & time: 02/09/16  1617     History   Chief Complaint Chief Complaint  Patient presents with  . Fall    HPI [name] [name] is a 83 y.o. female.  Level V Caveat: Dementia.   [name] [name] is a 83 y.o. Female who presents to the ED from memory care unit after she fell out of her wheelchair today. EMS reports she had no LOC, but that nursing facility reports that she did hit her head. She is not on anticoagulants. Facility reports the patient is acting at her baseline. I spoke with her nurse at the facility reports that they witnessed the fall. She stood out of her wheelchair and attempt to look out the window and stripped on the foot rest. Patient has been acting normally and at her baseline. No fevers recently. No recent illness.  She has dementia. Patient was placed in c-collar by EMS. Patient has had no complaints. With me, patient is pleasantly demented and has no complaints. Tetanus shot is up-to-date. Patient has DNR.    The history is provided by the nursing home and the EMS personnel. No language interpreter was used.  Fall     Past Medical History:  Diagnosis Date  . Allergy   . Asthma   . B12 deficiency   . Chronic headaches   . CKD (chronic kidney disease) stage 3, GFR 30-59 ml/min   . COPD (chronic obstructive pulmonary disease) ([place])   . Dementia   . Depression   . Hyperlipidemia   . Hypertension   . Hypothyroidism   . Pulmonary fibrosis ([facility])    Chronic granulomatous changes on CT chest, minimal mediastinal LAN 3/09  . Vitamin D deficiency     Patient Active Problem List   Diagnosis Date Noted  . Generalized anxiety disorder 08/16/2015  . Insomnia 08/16/2015  . Delirium   . Closed left hip fracture ([place]) 07/26/2015  . T2_NIDDM w/Stage 3 CKD (GFR 54 ml/min) 11/27/2013  . COPD with chronic bronchitis ([place]) 11/27/2013  . Asthma with exacerbation 11/27/2013  . Medication management  06/25/2013  . OBS 05/22/2013  . Vitamin D deficiency   . Dementia   . Depression   . 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 Hypothyroidism 01/24/2007  . Mixed hyperlipidemia 01/24/2007  . Essential hypertension 01/24/2007  . Asthma 01/24/2007  . GERD 01/24/2007  . Irritable bowel syndrome 01/24/2007    Past Surgical History:  Procedure Laterality Date  . ANTERIOR APPROACH HEMI HIP ARTHROPLASTY Left 07/28/2015   Procedure: ANTERIOR APPROACH HEMI HIP ARTHROPLASTY;  Surgeon: Renette Butters, MD;  Location: [facility];  Service: Orthopedics;  Laterality: Left;  . APPENDECTOMY    . BREAST BIOPSY     right breast/benign  . ENDOMETRIAL BIOPSY  1998   negative  . ESOPHAGOGASTRODUODENOSCOPY  2012   Gastritis  . THYROIDECTOMY    . TUBAL LIGATION      OB History    No data available       Home Medications    Prior to Admission medications   Medication Sig Start Date End Date Taking? Authorizing Provider  acetaminophen (TYLENOL) 325 MG tablet Take 2 tablets (650 mg total) by mouth every 6 (six) hours as needed for mild pain, moderate pain or fever (or Fever >/= 101). Patient taking differently: Take 325 mg by mouth daily. For DVT prophylaxis 07/31/15   Ainsley Spinner, PA-C  clonazePAM (KLONOPIN) 0.5 MG tablet Take  0.5 tablets (0.25 mg total) by mouth daily as needed for anxiety. 08/01/15   Costin Karlyne Greenspan, MD  clonazePAM (KLONOPIN) 1 MG tablet Take 1 tablet (1 mg total) by mouth at bedtime. 08/01/15   Costin Karlyne Greenspan, MD  Cranberry 425 MG CAPS Take 1 capsule by mouth daily.    Historical Provider, MD  levothyroxine (SYNTHROID, LEVOTHROID) 25 MCG tablet Take 25 mcg by mouth daily before breakfast.    Historical Provider, MD  loratadine (CLARITIN) 10 MG tablet Take 10 mg by mouth every morning.     Historical Provider, MD  Melatonin 10 MG CAPS Take 1 capsule by mouth daily.    Historical Provider, MD  nystatin cream (MYCOSTATIN) Apply 1 application topically 2 (two) times daily.  To inguinal creases, groin, labia and peri-anal for fungal rash.    Historical Provider, MD  omeprazole (PRILOSEC) 20 MG capsule Take 1 capsule (20 mg total) by mouth daily. 07/28/15   Costin Karlyne Greenspan, MD  sertraline (ZOLOFT) 100 MG tablet Take 100 mg by mouth daily. Take along with the 25mg  tablet to make a total dose of 125mg  daily    Historical Provider, MD  sertraline (ZOLOFT) 25 MG tablet Take 25 mg by mouth daily. Take along with the 100mg  tablet to make a total of 125mg  daily    Historical Provider, MD  traZODone (DESYREL) 100 MG tablet Take 100 mg by mouth at bedtime. For insomnia    Historical Provider, MD  verapamil (CALAN-SR) 240 MG CR tablet take 1 tablet by mouth once daily for blood pressure 01/06/15   Unk Pinto, MD    Family History Family History  Problem Relation Age of Onset  . Asthma Sister   . Cancer Sister     breast, colon  . Diabetes Mother   . Cancer Mother     breast  . Diabetes Sister   . Cancer Sister 7    breast  . Heart disease Father   . Hypertension Father   . Cancer Brother     leukemia  . Cancer Brother     lung  . Hypertension Brother   . Hyperlipidemia Brother     Social History Social History  Substance Use Topics  . Smoking status: Never Smoker  . Smokeless tobacco: Never Used  . Alcohol use No     Allergies   Iron; Meloxicam; Meperidine hcl; Pravastatin; Zolpidem tartrate; and Codeine   Review of Systems Review of Systems  Unable to perform ROS: Dementia     Physical Exam Updated Vital Signs BP 131/75   Pulse 68   Temp 98.7 F (37.1 C) (Oral)   Resp 17   Wt 67.6 kg   SpO2 100%   BMI 24.05 kg/m   Physical Exam  Constitutional: She appears well-developed and well-nourished. No distress.  Nontoxic appearing.   HENT:  Head: Normocephalic.  Right Ear: External ear normal.  Left Ear: External ear normal.  Mouth/Throat: Oropharynx is clear and moist.  Small hematoma noted to her left occipital area. No other  visible or palpated signs of trauma.   Eyes: Conjunctivae and EOM are normal. Pupils are equal, round, and reactive to light. Right eye exhibits no discharge. Left eye exhibits no discharge.  Neck: Neck supple.  No midline neck TTP.   Cardiovascular: Normal rate, regular rhythm, normal heart sounds and intact distal pulses.   Pulmonary/Chest: Effort normal and breath sounds normal. No respiratory distress. She has no wheezes. She has no rales. She exhibits no  tenderness.  Lungs CTA bilaterally. No chest wall TTP. Symmetric chest expansion bilaterally.   Abdominal: Soft. There is no tenderness. There is no guarding.  Musculoskeletal: Normal range of motion. She exhibits no edema, tenderness or deformity.  Small 2 mm abrasion noted to her left anterior knee. Good ROM of her left knee without pain. No midline neck or back TTP. Joints are supple and non-tender to palpation.   Lymphadenopathy:    She has no cervical adenopathy.  Neurological: She is alert. She exhibits normal muscle tone. Coordination normal.  Patient is alert and oriented to person only. Pleasantly demented. Patient is spontaneously moving all extremities in a coordinated fashion exhibiting good strength. Tongue protrusion normal. Speech is clear and coherent.   Skin: Skin is warm and dry. Capillary refill takes less than 2 seconds. No rash noted. She is not diaphoretic. No erythema. No pallor.  Psychiatric: She has a normal mood and affect. Her behavior is normal.  Nursing note and vitals reviewed.    ED Treatments / Results  Labs (all labs ordered are listed, but only abnormal results are displayed) Labs Reviewed - No data to display  EKG  EKG Interpretation None       Radiology Ct Head Wo Contrast  Result Date: 02/09/2016 CLINICAL DATA:  Follow out of a wheelchair. Left posterior scalp hematoma. EXAM: CT HEAD WITHOUT CONTRAST CT CERVICAL SPINE WITHOUT CONTRAST TECHNIQUE: Multidetector CT imaging of the head and  cervical spine was performed following the standard protocol without intravenous contrast. Multiplanar CT image reconstructions of the cervical spine were also generated. COMPARISON:  07/26/2015 FINDINGS: CT HEAD FINDINGS Brain: No evidence of acute infarction, hemorrhage, hydrocephalus, extra-axial collection or mass lesion/mass effect. Mild brain parenchymal volume loss and moderate deep white matter microangiopathy noted. Vascular: Calcific atherosclerotic disease at the skullbase. Skull: Normal. Negative for fracture or focal lesion. Sinuses/Orbits: No acute finding. Other: None. CT CERVICAL SPINE FINDINGS Alignment: Normal. Skull base and vertebrae: No acute fracture. No primary bone lesion or focal pathologic process. Soft tissues and spinal canal: No prevertebral fluid or swelling. No visible canal hematoma. Small gas bubble and soft tissue thickening within the subdural space of the anterior spinal canal, just posterior to C3-C4 disc likely represents posterior extrusion of disc osteophyte complex. Disc levels: Multilevel osteoarthritic changes with disc space narrowing, endplate sclerosis, remodeling of vertebral bodies and osteophyte formation. Upper chest: Subpleural linear and nodular thickening of bilateral lung apices. More masslike posterolateral subpleural right apical thickening. Other: None. IMPRESSION: No evidence of acute traumatic injury to the head. Mild brain parenchymal volume loss and moderate deep white matter microangiopathy. Multilevel osteoarthritic changes of the cervical spine. Small gas bubble and soft tissue thickening within the subdural space of the anterior spinal canal, just posterior to C3-C4 disc likely represents posterior extrusion of disc osteophyte complex. It is difficult to determine whether this is due to acute trauma or chronic osteoarthritic changes. No associated fractures seen. Subpleural linear and nodular thickening of bilateral lung apices with somewhat masslike  right posterolateral subpleural thickening. This may represent subpleural scarring, however lung malignancy cannot be excluded. Correlation with chest CT may be considered if found clinically warranted. Electronically Signed   By: Fidela Salisbury M.D.   On: 02/09/2016 18:09   Ct Cervical Spine Wo Contrast  Result Date: 02/09/2016 CLINICAL DATA:  Follow out of a wheelchair. Left posterior scalp hematoma. EXAM: CT HEAD WITHOUT CONTRAST CT CERVICAL SPINE WITHOUT CONTRAST TECHNIQUE: Multidetector CT imaging of the head and cervical spine  was performed following the standard protocol without intravenous contrast. Multiplanar CT image reconstructions of the cervical spine were also generated. COMPARISON:  07/26/2015 FINDINGS: CT HEAD FINDINGS Brain: No evidence of acute infarction, hemorrhage, hydrocephalus, extra-axial collection or mass lesion/mass effect. Mild brain parenchymal volume loss and moderate deep white matter microangiopathy noted. Vascular: Calcific atherosclerotic disease at the skullbase. Skull: Normal. Negative for fracture or focal lesion. Sinuses/Orbits: No acute finding. Other: None. CT CERVICAL SPINE FINDINGS Alignment: Normal. Skull base and vertebrae: No acute fracture. No primary bone lesion or focal pathologic process. Soft tissues and spinal canal: No prevertebral fluid or swelling. No visible canal hematoma. Small gas bubble and soft tissue thickening within the subdural space of the anterior spinal canal, just posterior to C3-C4 disc likely represents posterior extrusion of disc osteophyte complex. Disc levels: Multilevel osteoarthritic changes with disc space narrowing, endplate sclerosis, remodeling of vertebral bodies and osteophyte formation. Upper chest: Subpleural linear and nodular thickening of bilateral lung apices. More masslike posterolateral subpleural right apical thickening. Other: None. IMPRESSION: No evidence of acute traumatic injury to the head. Mild brain parenchymal  volume loss and moderate deep white matter microangiopathy. Multilevel osteoarthritic changes of the cervical spine. Small gas bubble and soft tissue thickening within the subdural space of the anterior spinal canal, just posterior to C3-C4 disc likely represents posterior extrusion of disc osteophyte complex. It is difficult to determine whether this is due to acute trauma or chronic osteoarthritic changes. No associated fractures seen. Subpleural linear and nodular thickening of bilateral lung apices with somewhat masslike right posterolateral subpleural thickening. This may represent subpleural scarring, however lung malignancy cannot be excluded. Correlation with chest CT may be considered if found clinically warranted. Electronically Signed   By: Fidela Salisbury M.D.   On: 02/09/2016 18:09    Procedures Procedures (including critical care time)  Medications Ordered in ED Medications - No data to display   Initial Impression / Assessment and Plan / ED Course  I have reviewed the triage vital signs and the nursing notes.  Pertinent labs & imaging results that were available during my care of the patient were reviewed by me and considered in my medical decision making (see chart for details).  Clinical Course    This is a 83 y.o. Female who presents to the ED from memory care unit after she fell out of her wheelchair today. EMS reports she had no LOC, but that nursing facility reports that she did hit her head. She is not on anticoagulants. Facility reports the patient is acting at her baseline. I spoke with her nurse at the facility reports that they witnessed the fall. She stood out of her wheelchair and attempt to look out the window and stripped on the foot rest. Patient has been acting normally and at her baseline. No fevers recently. No recent illness.  She has dementia. Patient was placed in c-collar by EMS. Patient has had no complaints. With me, patient is pleasantly demented and has  no complaints. Tetanus shot is up-to-date. Patient has DNR.  On exam the patient is afebrile nontoxic appearing. She is pleasantly demented. No complaints of pain. Joints are supple and nontender to palpation. She has a small abrasion to her left anterior knee, but has good ROM of her knee without pain. No focal neurological deficits noted.  CT head and neck were obtained.  CT head showed no evidence of acute traumatic injury. Cervical spine imaging shows a small gas bubble and soft tissue thickening within the  subdural space of the anterior spinal canal, just posterior to C3-C4 disc. It likely represents posterior extrusion of disc osteophyte complex. No outward signs of trauma to her neck and no TTP on exam. No associated fractures seen on scan. Cervical CT also shows some nodular thickening of bilateral lung apices with somewhat masslike right posterior lateral subpleural thickening. This may represent subpleural scarring however lung malignancy cannot be excluded. I discussed these findings with son who is now at bedside. He does not wish for further workup at this time for her malignancy. Patient is a DNR and demented.  I think this is a reasonable plan. Will discharge back to skilled nursing facility. Head injury precautions given.    Final Clinical Impressions(s) / ED Diagnoses   Final diagnoses:  Fall, initial encounter  Injury of head, initial encounter    New Prescriptions New Prescriptions   No medications on file     Waynetta Pean, PA-C 02/09/16 1938    Merrily Pew, MD 02/09/16 2145

## 2016-02-14 DIAGNOSIS — Z9181 History of falling: Secondary | ICD-10-CM | POA: Diagnosis not present

## 2016-02-14 DIAGNOSIS — Z20828 Contact with and (suspected) exposure to other viral communicable diseases: Secondary | ICD-10-CM | POA: Diagnosis not present

## 2016-02-17 DIAGNOSIS — R1312 Dysphagia, oropharyngeal phase: Secondary | ICD-10-CM | POA: Diagnosis not present

## 2016-02-17 DIAGNOSIS — J449 Chronic obstructive pulmonary disease, unspecified: Secondary | ICD-10-CM | POA: Diagnosis not present

## 2016-02-21 DIAGNOSIS — R451 Restlessness and agitation: Secondary | ICD-10-CM | POA: Diagnosis not present

## 2016-02-21 DIAGNOSIS — Z0000 Encounter for general adult medical examination without abnormal findings: Secondary | ICD-10-CM | POA: Diagnosis not present

## 2016-02-27 DIAGNOSIS — G301 Alzheimer's disease with late onset: Secondary | ICD-10-CM | POA: Diagnosis not present

## 2016-03-06 DIAGNOSIS — L570 Actinic keratosis: Secondary | ICD-10-CM | POA: Diagnosis not present

## 2016-03-19 DIAGNOSIS — R1312 Dysphagia, oropharyngeal phase: Secondary | ICD-10-CM | POA: Diagnosis not present

## 2016-03-19 DIAGNOSIS — J449 Chronic obstructive pulmonary disease, unspecified: Secondary | ICD-10-CM | POA: Diagnosis not present

## 2016-03-22 DIAGNOSIS — Z79899 Other long term (current) drug therapy: Secondary | ICD-10-CM | POA: Diagnosis not present

## 2016-03-23 DIAGNOSIS — E785 Hyperlipidemia, unspecified: Secondary | ICD-10-CM | POA: Diagnosis not present

## 2016-03-23 DIAGNOSIS — G301 Alzheimer's disease with late onset: Secondary | ICD-10-CM | POA: Diagnosis not present

## 2016-03-26 DIAGNOSIS — G301 Alzheimer's disease with late onset: Secondary | ICD-10-CM | POA: Diagnosis not present

## 2016-03-29 DIAGNOSIS — L853 Xerosis cutis: Secondary | ICD-10-CM | POA: Diagnosis not present

## 2016-03-29 DIAGNOSIS — E118 Type 2 diabetes mellitus with unspecified complications: Secondary | ICD-10-CM | POA: Diagnosis not present

## 2016-03-29 DIAGNOSIS — M2010 Hallux valgus (acquired), unspecified foot: Secondary | ICD-10-CM | POA: Diagnosis not present

## 2016-03-31 DIAGNOSIS — E784 Other hyperlipidemia: Secondary | ICD-10-CM | POA: Diagnosis not present

## 2016-03-31 DIAGNOSIS — L570 Actinic keratosis: Secondary | ICD-10-CM | POA: Diagnosis not present

## 2016-03-31 DIAGNOSIS — E118 Type 2 diabetes mellitus with unspecified complications: Secondary | ICD-10-CM | POA: Diagnosis not present

## 2016-03-31 DIAGNOSIS — E039 Hypothyroidism, unspecified: Secondary | ICD-10-CM | POA: Diagnosis not present

## 2016-03-31 DIAGNOSIS — I10 Essential (primary) hypertension: Secondary | ICD-10-CM | POA: Diagnosis not present

## 2016-04-16 DIAGNOSIS — R1312 Dysphagia, oropharyngeal phase: Secondary | ICD-10-CM | POA: Diagnosis not present

## 2016-04-16 DIAGNOSIS — J449 Chronic obstructive pulmonary disease, unspecified: Secondary | ICD-10-CM | POA: Diagnosis not present

## 2016-04-17 DIAGNOSIS — W1839XA Other fall on same level, initial encounter: Secondary | ICD-10-CM | POA: Diagnosis not present

## 2016-04-17 DIAGNOSIS — L570 Actinic keratosis: Secondary | ICD-10-CM | POA: Diagnosis not present

## 2016-04-24 DIAGNOSIS — G301 Alzheimer's disease with late onset: Secondary | ICD-10-CM | POA: Diagnosis not present

## 2016-05-01 DIAGNOSIS — E784 Other hyperlipidemia: Secondary | ICD-10-CM | POA: Diagnosis not present

## 2016-05-01 DIAGNOSIS — E039 Hypothyroidism, unspecified: Secondary | ICD-10-CM | POA: Diagnosis not present

## 2016-05-01 DIAGNOSIS — L570 Actinic keratosis: Secondary | ICD-10-CM | POA: Diagnosis not present

## 2016-05-01 DIAGNOSIS — E118 Type 2 diabetes mellitus with unspecified complications: Secondary | ICD-10-CM | POA: Diagnosis not present

## 2016-05-01 DIAGNOSIS — I10 Essential (primary) hypertension: Secondary | ICD-10-CM | POA: Diagnosis not present

## 2016-05-16 DIAGNOSIS — Z79899 Other long term (current) drug therapy: Secondary | ICD-10-CM | POA: Diagnosis not present

## 2016-05-17 DIAGNOSIS — J449 Chronic obstructive pulmonary disease, unspecified: Secondary | ICD-10-CM | POA: Diagnosis not present

## 2016-05-17 DIAGNOSIS — R1312 Dysphagia, oropharyngeal phase: Secondary | ICD-10-CM | POA: Diagnosis not present

## 2016-05-23 DIAGNOSIS — G301 Alzheimer's disease with late onset: Secondary | ICD-10-CM | POA: Diagnosis not present

## 2016-05-29 DIAGNOSIS — E118 Type 2 diabetes mellitus with unspecified complications: Secondary | ICD-10-CM | POA: Diagnosis not present

## 2016-05-29 DIAGNOSIS — I10 Essential (primary) hypertension: Secondary | ICD-10-CM | POA: Diagnosis not present

## 2016-05-29 DIAGNOSIS — E039 Hypothyroidism, unspecified: Secondary | ICD-10-CM | POA: Diagnosis not present

## 2016-05-29 DIAGNOSIS — K5900 Constipation, unspecified: Secondary | ICD-10-CM | POA: Diagnosis not present

## 2016-05-29 DIAGNOSIS — L570 Actinic keratosis: Secondary | ICD-10-CM | POA: Diagnosis not present

## 2016-05-31 DIAGNOSIS — E785 Hyperlipidemia, unspecified: Secondary | ICD-10-CM | POA: Diagnosis not present

## 2016-05-31 DIAGNOSIS — Z86711 Personal history of pulmonary embolism: Secondary | ICD-10-CM | POA: Diagnosis not present

## 2016-05-31 DIAGNOSIS — G309 Alzheimer's disease, unspecified: Secondary | ICD-10-CM | POA: Diagnosis not present

## 2016-05-31 DIAGNOSIS — J449 Chronic obstructive pulmonary disease, unspecified: Secondary | ICD-10-CM | POA: Diagnosis not present

## 2016-05-31 DIAGNOSIS — S81802D Unspecified open wound, left lower leg, subsequent encounter: Secondary | ICD-10-CM | POA: Diagnosis not present

## 2016-05-31 DIAGNOSIS — E119 Type 2 diabetes mellitus without complications: Secondary | ICD-10-CM | POA: Diagnosis not present

## 2016-06-04 DIAGNOSIS — Z86711 Personal history of pulmonary embolism: Secondary | ICD-10-CM | POA: Diagnosis not present

## 2016-06-04 DIAGNOSIS — S81802D Unspecified open wound, left lower leg, subsequent encounter: Secondary | ICD-10-CM | POA: Diagnosis not present

## 2016-06-04 DIAGNOSIS — J449 Chronic obstructive pulmonary disease, unspecified: Secondary | ICD-10-CM | POA: Diagnosis not present

## 2016-06-04 DIAGNOSIS — G309 Alzheimer's disease, unspecified: Secondary | ICD-10-CM | POA: Diagnosis not present

## 2016-06-04 DIAGNOSIS — E119 Type 2 diabetes mellitus without complications: Secondary | ICD-10-CM | POA: Diagnosis not present

## 2016-06-04 DIAGNOSIS — E785 Hyperlipidemia, unspecified: Secondary | ICD-10-CM | POA: Diagnosis not present

## 2016-06-07 DIAGNOSIS — J449 Chronic obstructive pulmonary disease, unspecified: Secondary | ICD-10-CM | POA: Diagnosis not present

## 2016-06-07 DIAGNOSIS — E785 Hyperlipidemia, unspecified: Secondary | ICD-10-CM | POA: Diagnosis not present

## 2016-06-07 DIAGNOSIS — Z86711 Personal history of pulmonary embolism: Secondary | ICD-10-CM | POA: Diagnosis not present

## 2016-06-07 DIAGNOSIS — S81802D Unspecified open wound, left lower leg, subsequent encounter: Secondary | ICD-10-CM | POA: Diagnosis not present

## 2016-06-07 DIAGNOSIS — G309 Alzheimer's disease, unspecified: Secondary | ICD-10-CM | POA: Diagnosis not present

## 2016-06-07 DIAGNOSIS — E119 Type 2 diabetes mellitus without complications: Secondary | ICD-10-CM | POA: Diagnosis not present

## 2016-06-08 DIAGNOSIS — E039 Hypothyroidism, unspecified: Secondary | ICD-10-CM | POA: Diagnosis not present

## 2016-06-08 DIAGNOSIS — I10 Essential (primary) hypertension: Secondary | ICD-10-CM | POA: Diagnosis not present

## 2016-06-11 DIAGNOSIS — J449 Chronic obstructive pulmonary disease, unspecified: Secondary | ICD-10-CM | POA: Diagnosis not present

## 2016-06-11 DIAGNOSIS — G309 Alzheimer's disease, unspecified: Secondary | ICD-10-CM | POA: Diagnosis not present

## 2016-06-11 DIAGNOSIS — Z86711 Personal history of pulmonary embolism: Secondary | ICD-10-CM | POA: Diagnosis not present

## 2016-06-11 DIAGNOSIS — E785 Hyperlipidemia, unspecified: Secondary | ICD-10-CM | POA: Diagnosis not present

## 2016-06-11 DIAGNOSIS — E119 Type 2 diabetes mellitus without complications: Secondary | ICD-10-CM | POA: Diagnosis not present

## 2016-06-11 DIAGNOSIS — S81802D Unspecified open wound, left lower leg, subsequent encounter: Secondary | ICD-10-CM | POA: Diagnosis not present

## 2016-06-12 DIAGNOSIS — L570 Actinic keratosis: Secondary | ICD-10-CM | POA: Diagnosis not present

## 2016-06-14 DIAGNOSIS — Z86711 Personal history of pulmonary embolism: Secondary | ICD-10-CM | POA: Diagnosis not present

## 2016-06-14 DIAGNOSIS — S81802D Unspecified open wound, left lower leg, subsequent encounter: Secondary | ICD-10-CM | POA: Diagnosis not present

## 2016-06-14 DIAGNOSIS — E785 Hyperlipidemia, unspecified: Secondary | ICD-10-CM | POA: Diagnosis not present

## 2016-06-14 DIAGNOSIS — J449 Chronic obstructive pulmonary disease, unspecified: Secondary | ICD-10-CM | POA: Diagnosis not present

## 2016-06-14 DIAGNOSIS — E119 Type 2 diabetes mellitus without complications: Secondary | ICD-10-CM | POA: Diagnosis not present

## 2016-06-14 DIAGNOSIS — G309 Alzheimer's disease, unspecified: Secondary | ICD-10-CM | POA: Diagnosis not present

## 2016-06-16 DIAGNOSIS — J449 Chronic obstructive pulmonary disease, unspecified: Secondary | ICD-10-CM | POA: Diagnosis not present

## 2016-06-16 DIAGNOSIS — R1312 Dysphagia, oropharyngeal phase: Secondary | ICD-10-CM | POA: Diagnosis not present

## 2016-06-18 DIAGNOSIS — J449 Chronic obstructive pulmonary disease, unspecified: Secondary | ICD-10-CM | POA: Diagnosis not present

## 2016-06-18 DIAGNOSIS — G309 Alzheimer's disease, unspecified: Secondary | ICD-10-CM | POA: Diagnosis not present

## 2016-06-18 DIAGNOSIS — E119 Type 2 diabetes mellitus without complications: Secondary | ICD-10-CM | POA: Diagnosis not present

## 2016-06-18 DIAGNOSIS — Z86711 Personal history of pulmonary embolism: Secondary | ICD-10-CM | POA: Diagnosis not present

## 2016-06-18 DIAGNOSIS — S81802D Unspecified open wound, left lower leg, subsequent encounter: Secondary | ICD-10-CM | POA: Diagnosis not present

## 2016-06-18 DIAGNOSIS — E785 Hyperlipidemia, unspecified: Secondary | ICD-10-CM | POA: Diagnosis not present

## 2016-06-21 DIAGNOSIS — G309 Alzheimer's disease, unspecified: Secondary | ICD-10-CM | POA: Diagnosis not present

## 2016-06-21 DIAGNOSIS — E119 Type 2 diabetes mellitus without complications: Secondary | ICD-10-CM | POA: Diagnosis not present

## 2016-06-21 DIAGNOSIS — E785 Hyperlipidemia, unspecified: Secondary | ICD-10-CM | POA: Diagnosis not present

## 2016-06-21 DIAGNOSIS — S81802D Unspecified open wound, left lower leg, subsequent encounter: Secondary | ICD-10-CM | POA: Diagnosis not present

## 2016-06-21 DIAGNOSIS — Z86711 Personal history of pulmonary embolism: Secondary | ICD-10-CM | POA: Diagnosis not present

## 2016-06-21 DIAGNOSIS — J449 Chronic obstructive pulmonary disease, unspecified: Secondary | ICD-10-CM | POA: Diagnosis not present

## 2016-06-25 DIAGNOSIS — Z86711 Personal history of pulmonary embolism: Secondary | ICD-10-CM | POA: Diagnosis not present

## 2016-06-25 DIAGNOSIS — J449 Chronic obstructive pulmonary disease, unspecified: Secondary | ICD-10-CM | POA: Diagnosis not present

## 2016-06-25 DIAGNOSIS — G309 Alzheimer's disease, unspecified: Secondary | ICD-10-CM | POA: Diagnosis not present

## 2016-06-25 DIAGNOSIS — E785 Hyperlipidemia, unspecified: Secondary | ICD-10-CM | POA: Diagnosis not present

## 2016-06-25 DIAGNOSIS — G301 Alzheimer's disease with late onset: Secondary | ICD-10-CM | POA: Diagnosis not present

## 2016-06-25 DIAGNOSIS — E119 Type 2 diabetes mellitus without complications: Secondary | ICD-10-CM | POA: Diagnosis not present

## 2016-06-25 DIAGNOSIS — S81802D Unspecified open wound, left lower leg, subsequent encounter: Secondary | ICD-10-CM | POA: Diagnosis not present

## 2016-06-26 DIAGNOSIS — L570 Actinic keratosis: Secondary | ICD-10-CM | POA: Diagnosis not present

## 2016-06-26 DIAGNOSIS — E118 Type 2 diabetes mellitus with unspecified complications: Secondary | ICD-10-CM | POA: Diagnosis not present

## 2016-06-26 DIAGNOSIS — Z79899 Other long term (current) drug therapy: Secondary | ICD-10-CM | POA: Diagnosis not present

## 2016-06-26 DIAGNOSIS — I10 Essential (primary) hypertension: Secondary | ICD-10-CM | POA: Diagnosis not present

## 2016-06-26 DIAGNOSIS — J449 Chronic obstructive pulmonary disease, unspecified: Secondary | ICD-10-CM | POA: Diagnosis not present

## 2016-06-26 DIAGNOSIS — E039 Hypothyroidism, unspecified: Secondary | ICD-10-CM | POA: Diagnosis not present

## 2016-06-28 DIAGNOSIS — E119 Type 2 diabetes mellitus without complications: Secondary | ICD-10-CM | POA: Diagnosis not present

## 2016-06-28 DIAGNOSIS — E785 Hyperlipidemia, unspecified: Secondary | ICD-10-CM | POA: Diagnosis not present

## 2016-06-28 DIAGNOSIS — Z86711 Personal history of pulmonary embolism: Secondary | ICD-10-CM | POA: Diagnosis not present

## 2016-06-28 DIAGNOSIS — G309 Alzheimer's disease, unspecified: Secondary | ICD-10-CM | POA: Diagnosis not present

## 2016-06-28 DIAGNOSIS — S81802D Unspecified open wound, left lower leg, subsequent encounter: Secondary | ICD-10-CM | POA: Diagnosis not present

## 2016-06-28 DIAGNOSIS — J449 Chronic obstructive pulmonary disease, unspecified: Secondary | ICD-10-CM | POA: Diagnosis not present

## 2016-07-02 DIAGNOSIS — Z86711 Personal history of pulmonary embolism: Secondary | ICD-10-CM | POA: Diagnosis not present

## 2016-07-02 DIAGNOSIS — E119 Type 2 diabetes mellitus without complications: Secondary | ICD-10-CM | POA: Diagnosis not present

## 2016-07-02 DIAGNOSIS — E785 Hyperlipidemia, unspecified: Secondary | ICD-10-CM | POA: Diagnosis not present

## 2016-07-02 DIAGNOSIS — S81802D Unspecified open wound, left lower leg, subsequent encounter: Secondary | ICD-10-CM | POA: Diagnosis not present

## 2016-07-02 DIAGNOSIS — J449 Chronic obstructive pulmonary disease, unspecified: Secondary | ICD-10-CM | POA: Diagnosis not present

## 2016-07-02 DIAGNOSIS — G309 Alzheimer's disease, unspecified: Secondary | ICD-10-CM | POA: Diagnosis not present

## 2016-07-05 DIAGNOSIS — J449 Chronic obstructive pulmonary disease, unspecified: Secondary | ICD-10-CM | POA: Diagnosis not present

## 2016-07-05 DIAGNOSIS — Z86711 Personal history of pulmonary embolism: Secondary | ICD-10-CM | POA: Diagnosis not present

## 2016-07-05 DIAGNOSIS — E119 Type 2 diabetes mellitus without complications: Secondary | ICD-10-CM | POA: Diagnosis not present

## 2016-07-05 DIAGNOSIS — S81802D Unspecified open wound, left lower leg, subsequent encounter: Secondary | ICD-10-CM | POA: Diagnosis not present

## 2016-07-05 DIAGNOSIS — G309 Alzheimer's disease, unspecified: Secondary | ICD-10-CM | POA: Diagnosis not present

## 2016-07-05 DIAGNOSIS — E785 Hyperlipidemia, unspecified: Secondary | ICD-10-CM | POA: Diagnosis not present

## 2016-07-09 DIAGNOSIS — S81802D Unspecified open wound, left lower leg, subsequent encounter: Secondary | ICD-10-CM | POA: Diagnosis not present

## 2016-07-09 DIAGNOSIS — G309 Alzheimer's disease, unspecified: Secondary | ICD-10-CM | POA: Diagnosis not present

## 2016-07-09 DIAGNOSIS — J449 Chronic obstructive pulmonary disease, unspecified: Secondary | ICD-10-CM | POA: Diagnosis not present

## 2016-07-09 DIAGNOSIS — E119 Type 2 diabetes mellitus without complications: Secondary | ICD-10-CM | POA: Diagnosis not present

## 2016-07-09 DIAGNOSIS — Z86711 Personal history of pulmonary embolism: Secondary | ICD-10-CM | POA: Diagnosis not present

## 2016-07-09 DIAGNOSIS — E785 Hyperlipidemia, unspecified: Secondary | ICD-10-CM | POA: Diagnosis not present

## 2016-07-12 DIAGNOSIS — J449 Chronic obstructive pulmonary disease, unspecified: Secondary | ICD-10-CM | POA: Diagnosis not present

## 2016-07-12 DIAGNOSIS — S81802D Unspecified open wound, left lower leg, subsequent encounter: Secondary | ICD-10-CM | POA: Diagnosis not present

## 2016-07-12 DIAGNOSIS — Z86711 Personal history of pulmonary embolism: Secondary | ICD-10-CM | POA: Diagnosis not present

## 2016-07-12 DIAGNOSIS — E119 Type 2 diabetes mellitus without complications: Secondary | ICD-10-CM | POA: Diagnosis not present

## 2016-07-12 DIAGNOSIS — G309 Alzheimer's disease, unspecified: Secondary | ICD-10-CM | POA: Diagnosis not present

## 2016-07-12 DIAGNOSIS — E785 Hyperlipidemia, unspecified: Secondary | ICD-10-CM | POA: Diagnosis not present

## 2016-07-16 DIAGNOSIS — G309 Alzheimer's disease, unspecified: Secondary | ICD-10-CM | POA: Diagnosis not present

## 2016-07-16 DIAGNOSIS — E785 Hyperlipidemia, unspecified: Secondary | ICD-10-CM | POA: Diagnosis not present

## 2016-07-16 DIAGNOSIS — Z86711 Personal history of pulmonary embolism: Secondary | ICD-10-CM | POA: Diagnosis not present

## 2016-07-16 DIAGNOSIS — E119 Type 2 diabetes mellitus without complications: Secondary | ICD-10-CM | POA: Diagnosis not present

## 2016-07-16 DIAGNOSIS — S81802D Unspecified open wound, left lower leg, subsequent encounter: Secondary | ICD-10-CM | POA: Diagnosis not present

## 2016-07-16 DIAGNOSIS — J449 Chronic obstructive pulmonary disease, unspecified: Secondary | ICD-10-CM | POA: Diagnosis not present

## 2016-07-17 DIAGNOSIS — E784 Other hyperlipidemia: Secondary | ICD-10-CM | POA: Diagnosis not present

## 2016-07-17 DIAGNOSIS — K5900 Constipation, unspecified: Secondary | ICD-10-CM | POA: Diagnosis not present

## 2016-07-17 DIAGNOSIS — J449 Chronic obstructive pulmonary disease, unspecified: Secondary | ICD-10-CM | POA: Diagnosis not present

## 2016-07-17 DIAGNOSIS — E039 Hypothyroidism, unspecified: Secondary | ICD-10-CM | POA: Diagnosis not present

## 2016-07-17 DIAGNOSIS — J309 Allergic rhinitis, unspecified: Secondary | ICD-10-CM | POA: Diagnosis not present

## 2016-07-19 DIAGNOSIS — E785 Hyperlipidemia, unspecified: Secondary | ICD-10-CM | POA: Diagnosis not present

## 2016-07-19 DIAGNOSIS — E119 Type 2 diabetes mellitus without complications: Secondary | ICD-10-CM | POA: Diagnosis not present

## 2016-07-19 DIAGNOSIS — S81802D Unspecified open wound, left lower leg, subsequent encounter: Secondary | ICD-10-CM | POA: Diagnosis not present

## 2016-07-19 DIAGNOSIS — G309 Alzheimer's disease, unspecified: Secondary | ICD-10-CM | POA: Diagnosis not present

## 2016-07-19 DIAGNOSIS — J449 Chronic obstructive pulmonary disease, unspecified: Secondary | ICD-10-CM | POA: Diagnosis not present

## 2016-07-19 DIAGNOSIS — Z86711 Personal history of pulmonary embolism: Secondary | ICD-10-CM | POA: Diagnosis not present

## 2016-07-23 DIAGNOSIS — S81802D Unspecified open wound, left lower leg, subsequent encounter: Secondary | ICD-10-CM | POA: Diagnosis not present

## 2016-07-23 DIAGNOSIS — J449 Chronic obstructive pulmonary disease, unspecified: Secondary | ICD-10-CM | POA: Diagnosis not present

## 2016-07-23 DIAGNOSIS — Z86711 Personal history of pulmonary embolism: Secondary | ICD-10-CM | POA: Diagnosis not present

## 2016-07-23 DIAGNOSIS — E785 Hyperlipidemia, unspecified: Secondary | ICD-10-CM | POA: Diagnosis not present

## 2016-07-23 DIAGNOSIS — G309 Alzheimer's disease, unspecified: Secondary | ICD-10-CM | POA: Diagnosis not present

## 2016-07-23 DIAGNOSIS — E119 Type 2 diabetes mellitus without complications: Secondary | ICD-10-CM | POA: Diagnosis not present

## 2016-07-25 DIAGNOSIS — G301 Alzheimer's disease with late onset: Secondary | ICD-10-CM | POA: Diagnosis not present

## 2016-07-26 DIAGNOSIS — G309 Alzheimer's disease, unspecified: Secondary | ICD-10-CM | POA: Diagnosis not present

## 2016-07-26 DIAGNOSIS — E119 Type 2 diabetes mellitus without complications: Secondary | ICD-10-CM | POA: Diagnosis not present

## 2016-07-26 DIAGNOSIS — E785 Hyperlipidemia, unspecified: Secondary | ICD-10-CM | POA: Diagnosis not present

## 2016-07-26 DIAGNOSIS — S81802D Unspecified open wound, left lower leg, subsequent encounter: Secondary | ICD-10-CM | POA: Diagnosis not present

## 2016-07-26 DIAGNOSIS — J449 Chronic obstructive pulmonary disease, unspecified: Secondary | ICD-10-CM | POA: Diagnosis not present

## 2016-07-26 DIAGNOSIS — Z86711 Personal history of pulmonary embolism: Secondary | ICD-10-CM | POA: Diagnosis not present

## 2016-07-30 DIAGNOSIS — E785 Hyperlipidemia, unspecified: Secondary | ICD-10-CM | POA: Diagnosis not present

## 2016-07-30 DIAGNOSIS — S81802D Unspecified open wound, left lower leg, subsequent encounter: Secondary | ICD-10-CM | POA: Diagnosis not present

## 2016-07-30 DIAGNOSIS — Z86711 Personal history of pulmonary embolism: Secondary | ICD-10-CM | POA: Diagnosis not present

## 2016-07-30 DIAGNOSIS — G309 Alzheimer's disease, unspecified: Secondary | ICD-10-CM | POA: Diagnosis not present

## 2016-07-30 DIAGNOSIS — E119 Type 2 diabetes mellitus without complications: Secondary | ICD-10-CM | POA: Diagnosis not present

## 2016-07-30 DIAGNOSIS — J449 Chronic obstructive pulmonary disease, unspecified: Secondary | ICD-10-CM | POA: Diagnosis not present

## 2016-07-31 DIAGNOSIS — E119 Type 2 diabetes mellitus without complications: Secondary | ICD-10-CM | POA: Diagnosis not present

## 2016-07-31 DIAGNOSIS — G309 Alzheimer's disease, unspecified: Secondary | ICD-10-CM | POA: Diagnosis not present

## 2016-07-31 DIAGNOSIS — S81802D Unspecified open wound, left lower leg, subsequent encounter: Secondary | ICD-10-CM | POA: Diagnosis not present

## 2016-07-31 DIAGNOSIS — Z86711 Personal history of pulmonary embolism: Secondary | ICD-10-CM | POA: Diagnosis not present

## 2016-07-31 DIAGNOSIS — W1839XA Other fall on same level, initial encounter: Secondary | ICD-10-CM | POA: Diagnosis not present

## 2016-07-31 DIAGNOSIS — E785 Hyperlipidemia, unspecified: Secondary | ICD-10-CM | POA: Diagnosis not present

## 2016-07-31 DIAGNOSIS — Z9181 History of falling: Secondary | ICD-10-CM | POA: Diagnosis not present

## 2016-07-31 DIAGNOSIS — J449 Chronic obstructive pulmonary disease, unspecified: Secondary | ICD-10-CM | POA: Diagnosis not present

## 2016-08-02 DIAGNOSIS — S81802D Unspecified open wound, left lower leg, subsequent encounter: Secondary | ICD-10-CM | POA: Diagnosis not present

## 2016-08-02 DIAGNOSIS — E119 Type 2 diabetes mellitus without complications: Secondary | ICD-10-CM | POA: Diagnosis not present

## 2016-08-02 DIAGNOSIS — Z86711 Personal history of pulmonary embolism: Secondary | ICD-10-CM | POA: Diagnosis not present

## 2016-08-02 DIAGNOSIS — J449 Chronic obstructive pulmonary disease, unspecified: Secondary | ICD-10-CM | POA: Diagnosis not present

## 2016-08-02 DIAGNOSIS — G309 Alzheimer's disease, unspecified: Secondary | ICD-10-CM | POA: Diagnosis not present

## 2016-08-02 DIAGNOSIS — E785 Hyperlipidemia, unspecified: Secondary | ICD-10-CM | POA: Diagnosis not present

## 2016-08-06 DIAGNOSIS — S81802D Unspecified open wound, left lower leg, subsequent encounter: Secondary | ICD-10-CM | POA: Diagnosis not present

## 2016-08-06 DIAGNOSIS — Z86711 Personal history of pulmonary embolism: Secondary | ICD-10-CM | POA: Diagnosis not present

## 2016-08-06 DIAGNOSIS — J449 Chronic obstructive pulmonary disease, unspecified: Secondary | ICD-10-CM | POA: Diagnosis not present

## 2016-08-06 DIAGNOSIS — G309 Alzheimer's disease, unspecified: Secondary | ICD-10-CM | POA: Diagnosis not present

## 2016-08-06 DIAGNOSIS — E119 Type 2 diabetes mellitus without complications: Secondary | ICD-10-CM | POA: Diagnosis not present

## 2016-08-06 DIAGNOSIS — E785 Hyperlipidemia, unspecified: Secondary | ICD-10-CM | POA: Diagnosis not present

## 2016-08-07 DIAGNOSIS — E785 Hyperlipidemia, unspecified: Secondary | ICD-10-CM | POA: Diagnosis not present

## 2016-08-07 DIAGNOSIS — E119 Type 2 diabetes mellitus without complications: Secondary | ICD-10-CM | POA: Diagnosis not present

## 2016-08-07 DIAGNOSIS — J449 Chronic obstructive pulmonary disease, unspecified: Secondary | ICD-10-CM | POA: Diagnosis not present

## 2016-08-07 DIAGNOSIS — G309 Alzheimer's disease, unspecified: Secondary | ICD-10-CM | POA: Diagnosis not present

## 2016-08-07 DIAGNOSIS — Z86711 Personal history of pulmonary embolism: Secondary | ICD-10-CM | POA: Diagnosis not present

## 2016-08-07 DIAGNOSIS — S81802D Unspecified open wound, left lower leg, subsequent encounter: Secondary | ICD-10-CM | POA: Diagnosis not present

## 2016-08-09 DIAGNOSIS — E119 Type 2 diabetes mellitus without complications: Secondary | ICD-10-CM | POA: Diagnosis not present

## 2016-08-09 DIAGNOSIS — J449 Chronic obstructive pulmonary disease, unspecified: Secondary | ICD-10-CM | POA: Diagnosis not present

## 2016-08-09 DIAGNOSIS — S81802D Unspecified open wound, left lower leg, subsequent encounter: Secondary | ICD-10-CM | POA: Diagnosis not present

## 2016-08-09 DIAGNOSIS — G309 Alzheimer's disease, unspecified: Secondary | ICD-10-CM | POA: Diagnosis not present

## 2016-08-09 DIAGNOSIS — E785 Hyperlipidemia, unspecified: Secondary | ICD-10-CM | POA: Diagnosis not present

## 2016-08-09 DIAGNOSIS — Z86711 Personal history of pulmonary embolism: Secondary | ICD-10-CM | POA: Diagnosis not present

## 2016-08-13 DIAGNOSIS — L570 Actinic keratosis: Secondary | ICD-10-CM | POA: Diagnosis not present

## 2016-08-13 DIAGNOSIS — Z86711 Personal history of pulmonary embolism: Secondary | ICD-10-CM | POA: Diagnosis not present

## 2016-08-13 DIAGNOSIS — J449 Chronic obstructive pulmonary disease, unspecified: Secondary | ICD-10-CM | POA: Diagnosis not present

## 2016-08-13 DIAGNOSIS — E785 Hyperlipidemia, unspecified: Secondary | ICD-10-CM | POA: Diagnosis not present

## 2016-08-13 DIAGNOSIS — E119 Type 2 diabetes mellitus without complications: Secondary | ICD-10-CM | POA: Diagnosis not present

## 2016-08-13 DIAGNOSIS — S81802D Unspecified open wound, left lower leg, subsequent encounter: Secondary | ICD-10-CM | POA: Diagnosis not present

## 2016-08-13 DIAGNOSIS — L821 Other seborrheic keratosis: Secondary | ICD-10-CM | POA: Diagnosis not present

## 2016-08-13 DIAGNOSIS — D0472 Carcinoma in situ of skin of left lower limb, including hip: Secondary | ICD-10-CM | POA: Diagnosis not present

## 2016-08-13 DIAGNOSIS — G309 Alzheimer's disease, unspecified: Secondary | ICD-10-CM | POA: Diagnosis not present

## 2016-08-15 DIAGNOSIS — G309 Alzheimer's disease, unspecified: Secondary | ICD-10-CM | POA: Diagnosis not present

## 2016-08-15 DIAGNOSIS — J449 Chronic obstructive pulmonary disease, unspecified: Secondary | ICD-10-CM | POA: Diagnosis not present

## 2016-08-15 DIAGNOSIS — E785 Hyperlipidemia, unspecified: Secondary | ICD-10-CM | POA: Diagnosis not present

## 2016-08-15 DIAGNOSIS — Z86711 Personal history of pulmonary embolism: Secondary | ICD-10-CM | POA: Diagnosis not present

## 2016-08-15 DIAGNOSIS — S81802D Unspecified open wound, left lower leg, subsequent encounter: Secondary | ICD-10-CM | POA: Diagnosis not present

## 2016-08-15 DIAGNOSIS — E119 Type 2 diabetes mellitus without complications: Secondary | ICD-10-CM | POA: Diagnosis not present

## 2016-08-16 DIAGNOSIS — S81802D Unspecified open wound, left lower leg, subsequent encounter: Secondary | ICD-10-CM | POA: Diagnosis not present

## 2016-08-16 DIAGNOSIS — E119 Type 2 diabetes mellitus without complications: Secondary | ICD-10-CM | POA: Diagnosis not present

## 2016-08-16 DIAGNOSIS — G309 Alzheimer's disease, unspecified: Secondary | ICD-10-CM | POA: Diagnosis not present

## 2016-08-16 DIAGNOSIS — J449 Chronic obstructive pulmonary disease, unspecified: Secondary | ICD-10-CM | POA: Diagnosis not present

## 2016-08-16 DIAGNOSIS — E785 Hyperlipidemia, unspecified: Secondary | ICD-10-CM | POA: Diagnosis not present

## 2016-08-16 DIAGNOSIS — Z86711 Personal history of pulmonary embolism: Secondary | ICD-10-CM | POA: Diagnosis not present

## 2016-08-17 DIAGNOSIS — S81802D Unspecified open wound, left lower leg, subsequent encounter: Secondary | ICD-10-CM | POA: Diagnosis not present

## 2016-08-17 DIAGNOSIS — G309 Alzheimer's disease, unspecified: Secondary | ICD-10-CM | POA: Diagnosis not present

## 2016-08-17 DIAGNOSIS — E119 Type 2 diabetes mellitus without complications: Secondary | ICD-10-CM | POA: Diagnosis not present

## 2016-08-17 DIAGNOSIS — E785 Hyperlipidemia, unspecified: Secondary | ICD-10-CM | POA: Diagnosis not present

## 2016-08-17 DIAGNOSIS — Z86711 Personal history of pulmonary embolism: Secondary | ICD-10-CM | POA: Diagnosis not present

## 2016-08-17 DIAGNOSIS — J449 Chronic obstructive pulmonary disease, unspecified: Secondary | ICD-10-CM | POA: Diagnosis not present

## 2016-08-20 ENCOUNTER — Encounter: Payer: Self-pay | Admitting: Internal Medicine

## 2016-08-20 DIAGNOSIS — Z86711 Personal history of pulmonary embolism: Secondary | ICD-10-CM | POA: Diagnosis not present

## 2016-08-20 DIAGNOSIS — E119 Type 2 diabetes mellitus without complications: Secondary | ICD-10-CM | POA: Diagnosis not present

## 2016-08-20 DIAGNOSIS — G309 Alzheimer's disease, unspecified: Secondary | ICD-10-CM | POA: Diagnosis not present

## 2016-08-20 DIAGNOSIS — S81802D Unspecified open wound, left lower leg, subsequent encounter: Secondary | ICD-10-CM | POA: Diagnosis not present

## 2016-08-20 DIAGNOSIS — J449 Chronic obstructive pulmonary disease, unspecified: Secondary | ICD-10-CM | POA: Diagnosis not present

## 2016-08-20 DIAGNOSIS — E785 Hyperlipidemia, unspecified: Secondary | ICD-10-CM | POA: Diagnosis not present

## 2016-08-21 DIAGNOSIS — I10 Essential (primary) hypertension: Secondary | ICD-10-CM | POA: Diagnosis not present

## 2016-08-21 DIAGNOSIS — W1839XA Other fall on same level, initial encounter: Secondary | ICD-10-CM | POA: Diagnosis not present

## 2016-08-21 DIAGNOSIS — E118 Type 2 diabetes mellitus with unspecified complications: Secondary | ICD-10-CM | POA: Diagnosis not present

## 2016-08-21 DIAGNOSIS — E039 Hypothyroidism, unspecified: Secondary | ICD-10-CM | POA: Diagnosis not present

## 2016-08-21 DIAGNOSIS — J309 Allergic rhinitis, unspecified: Secondary | ICD-10-CM | POA: Diagnosis not present

## 2016-08-22 DIAGNOSIS — G309 Alzheimer's disease, unspecified: Secondary | ICD-10-CM | POA: Diagnosis not present

## 2016-08-22 DIAGNOSIS — S81802D Unspecified open wound, left lower leg, subsequent encounter: Secondary | ICD-10-CM | POA: Diagnosis not present

## 2016-08-22 DIAGNOSIS — Z86711 Personal history of pulmonary embolism: Secondary | ICD-10-CM | POA: Diagnosis not present

## 2016-08-22 DIAGNOSIS — E785 Hyperlipidemia, unspecified: Secondary | ICD-10-CM | POA: Diagnosis not present

## 2016-08-22 DIAGNOSIS — J449 Chronic obstructive pulmonary disease, unspecified: Secondary | ICD-10-CM | POA: Diagnosis not present

## 2016-08-22 DIAGNOSIS — E119 Type 2 diabetes mellitus without complications: Secondary | ICD-10-CM | POA: Diagnosis not present

## 2016-08-23 DIAGNOSIS — E119 Type 2 diabetes mellitus without complications: Secondary | ICD-10-CM | POA: Diagnosis not present

## 2016-08-23 DIAGNOSIS — Z86711 Personal history of pulmonary embolism: Secondary | ICD-10-CM | POA: Diagnosis not present

## 2016-08-23 DIAGNOSIS — E785 Hyperlipidemia, unspecified: Secondary | ICD-10-CM | POA: Diagnosis not present

## 2016-08-23 DIAGNOSIS — J449 Chronic obstructive pulmonary disease, unspecified: Secondary | ICD-10-CM | POA: Diagnosis not present

## 2016-08-23 DIAGNOSIS — S81802D Unspecified open wound, left lower leg, subsequent encounter: Secondary | ICD-10-CM | POA: Diagnosis not present

## 2016-08-23 DIAGNOSIS — G309 Alzheimer's disease, unspecified: Secondary | ICD-10-CM | POA: Diagnosis not present

## 2016-08-26 DIAGNOSIS — G301 Alzheimer's disease with late onset: Secondary | ICD-10-CM | POA: Diagnosis not present

## 2016-08-27 DIAGNOSIS — Z86711 Personal history of pulmonary embolism: Secondary | ICD-10-CM | POA: Diagnosis not present

## 2016-08-27 DIAGNOSIS — G309 Alzheimer's disease, unspecified: Secondary | ICD-10-CM | POA: Diagnosis not present

## 2016-08-27 DIAGNOSIS — S81802D Unspecified open wound, left lower leg, subsequent encounter: Secondary | ICD-10-CM | POA: Diagnosis not present

## 2016-08-27 DIAGNOSIS — E785 Hyperlipidemia, unspecified: Secondary | ICD-10-CM | POA: Diagnosis not present

## 2016-08-27 DIAGNOSIS — E119 Type 2 diabetes mellitus without complications: Secondary | ICD-10-CM | POA: Diagnosis not present

## 2016-08-27 DIAGNOSIS — J449 Chronic obstructive pulmonary disease, unspecified: Secondary | ICD-10-CM | POA: Diagnosis not present

## 2016-08-29 DIAGNOSIS — E119 Type 2 diabetes mellitus without complications: Secondary | ICD-10-CM | POA: Diagnosis not present

## 2016-08-29 DIAGNOSIS — D0472 Carcinoma in situ of skin of left lower limb, including hip: Secondary | ICD-10-CM | POA: Diagnosis not present

## 2016-08-29 DIAGNOSIS — Z86711 Personal history of pulmonary embolism: Secondary | ICD-10-CM | POA: Diagnosis not present

## 2016-08-29 DIAGNOSIS — E785 Hyperlipidemia, unspecified: Secondary | ICD-10-CM | POA: Diagnosis not present

## 2016-08-29 DIAGNOSIS — J449 Chronic obstructive pulmonary disease, unspecified: Secondary | ICD-10-CM | POA: Diagnosis not present

## 2016-08-29 DIAGNOSIS — G309 Alzheimer's disease, unspecified: Secondary | ICD-10-CM | POA: Diagnosis not present

## 2016-08-29 DIAGNOSIS — S81802D Unspecified open wound, left lower leg, subsequent encounter: Secondary | ICD-10-CM | POA: Diagnosis not present

## 2016-08-30 DIAGNOSIS — E119 Type 2 diabetes mellitus without complications: Secondary | ICD-10-CM | POA: Diagnosis not present

## 2016-08-30 DIAGNOSIS — J449 Chronic obstructive pulmonary disease, unspecified: Secondary | ICD-10-CM | POA: Diagnosis not present

## 2016-08-30 DIAGNOSIS — G309 Alzheimer's disease, unspecified: Secondary | ICD-10-CM | POA: Diagnosis not present

## 2016-08-30 DIAGNOSIS — Z86711 Personal history of pulmonary embolism: Secondary | ICD-10-CM | POA: Diagnosis not present

## 2016-08-30 DIAGNOSIS — E785 Hyperlipidemia, unspecified: Secondary | ICD-10-CM | POA: Diagnosis not present

## 2016-08-30 DIAGNOSIS — S81802D Unspecified open wound, left lower leg, subsequent encounter: Secondary | ICD-10-CM | POA: Diagnosis not present

## 2016-09-03 DIAGNOSIS — J449 Chronic obstructive pulmonary disease, unspecified: Secondary | ICD-10-CM | POA: Diagnosis not present

## 2016-09-03 DIAGNOSIS — E118 Type 2 diabetes mellitus with unspecified complications: Secondary | ICD-10-CM | POA: Diagnosis not present

## 2016-09-03 DIAGNOSIS — G309 Alzheimer's disease, unspecified: Secondary | ICD-10-CM | POA: Diagnosis not present

## 2016-09-03 DIAGNOSIS — E119 Type 2 diabetes mellitus without complications: Secondary | ICD-10-CM | POA: Diagnosis not present

## 2016-09-03 DIAGNOSIS — W1839XA Other fall on same level, initial encounter: Secondary | ICD-10-CM | POA: Diagnosis not present

## 2016-09-03 DIAGNOSIS — I10 Essential (primary) hypertension: Secondary | ICD-10-CM | POA: Diagnosis not present

## 2016-09-03 DIAGNOSIS — E785 Hyperlipidemia, unspecified: Secondary | ICD-10-CM | POA: Diagnosis not present

## 2016-09-03 DIAGNOSIS — J309 Allergic rhinitis, unspecified: Secondary | ICD-10-CM | POA: Diagnosis not present

## 2016-09-03 DIAGNOSIS — E039 Hypothyroidism, unspecified: Secondary | ICD-10-CM | POA: Diagnosis not present

## 2016-09-03 DIAGNOSIS — Z86711 Personal history of pulmonary embolism: Secondary | ICD-10-CM | POA: Diagnosis not present

## 2016-09-03 DIAGNOSIS — S81802D Unspecified open wound, left lower leg, subsequent encounter: Secondary | ICD-10-CM | POA: Diagnosis not present

## 2016-09-06 DIAGNOSIS — J449 Chronic obstructive pulmonary disease, unspecified: Secondary | ICD-10-CM | POA: Diagnosis not present

## 2016-09-06 DIAGNOSIS — Z86711 Personal history of pulmonary embolism: Secondary | ICD-10-CM | POA: Diagnosis not present

## 2016-09-06 DIAGNOSIS — E785 Hyperlipidemia, unspecified: Secondary | ICD-10-CM | POA: Diagnosis not present

## 2016-09-06 DIAGNOSIS — G309 Alzheimer's disease, unspecified: Secondary | ICD-10-CM | POA: Diagnosis not present

## 2016-09-06 DIAGNOSIS — E119 Type 2 diabetes mellitus without complications: Secondary | ICD-10-CM | POA: Diagnosis not present

## 2016-09-06 DIAGNOSIS — S81802D Unspecified open wound, left lower leg, subsequent encounter: Secondary | ICD-10-CM | POA: Diagnosis not present

## 2016-09-10 DIAGNOSIS — J449 Chronic obstructive pulmonary disease, unspecified: Secondary | ICD-10-CM | POA: Diagnosis not present

## 2016-09-10 DIAGNOSIS — E119 Type 2 diabetes mellitus without complications: Secondary | ICD-10-CM | POA: Diagnosis not present

## 2016-09-10 DIAGNOSIS — S81802D Unspecified open wound, left lower leg, subsequent encounter: Secondary | ICD-10-CM | POA: Diagnosis not present

## 2016-09-10 DIAGNOSIS — E785 Hyperlipidemia, unspecified: Secondary | ICD-10-CM | POA: Diagnosis not present

## 2016-09-10 DIAGNOSIS — Z86711 Personal history of pulmonary embolism: Secondary | ICD-10-CM | POA: Diagnosis not present

## 2016-09-10 DIAGNOSIS — G309 Alzheimer's disease, unspecified: Secondary | ICD-10-CM | POA: Diagnosis not present

## 2016-09-13 DIAGNOSIS — Z86711 Personal history of pulmonary embolism: Secondary | ICD-10-CM | POA: Diagnosis not present

## 2016-09-13 DIAGNOSIS — E119 Type 2 diabetes mellitus without complications: Secondary | ICD-10-CM | POA: Diagnosis not present

## 2016-09-13 DIAGNOSIS — G309 Alzheimer's disease, unspecified: Secondary | ICD-10-CM | POA: Diagnosis not present

## 2016-09-13 DIAGNOSIS — E785 Hyperlipidemia, unspecified: Secondary | ICD-10-CM | POA: Diagnosis not present

## 2016-09-13 DIAGNOSIS — J449 Chronic obstructive pulmonary disease, unspecified: Secondary | ICD-10-CM | POA: Diagnosis not present

## 2016-09-13 DIAGNOSIS — S81802D Unspecified open wound, left lower leg, subsequent encounter: Secondary | ICD-10-CM | POA: Diagnosis not present

## 2016-09-17 DIAGNOSIS — G309 Alzheimer's disease, unspecified: Secondary | ICD-10-CM | POA: Diagnosis not present

## 2016-09-17 DIAGNOSIS — E785 Hyperlipidemia, unspecified: Secondary | ICD-10-CM | POA: Diagnosis not present

## 2016-09-17 DIAGNOSIS — Z86711 Personal history of pulmonary embolism: Secondary | ICD-10-CM | POA: Diagnosis not present

## 2016-09-17 DIAGNOSIS — J449 Chronic obstructive pulmonary disease, unspecified: Secondary | ICD-10-CM | POA: Diagnosis not present

## 2016-09-17 DIAGNOSIS — E119 Type 2 diabetes mellitus without complications: Secondary | ICD-10-CM | POA: Diagnosis not present

## 2016-09-17 DIAGNOSIS — S81802D Unspecified open wound, left lower leg, subsequent encounter: Secondary | ICD-10-CM | POA: Diagnosis not present

## 2016-09-18 DIAGNOSIS — L570 Actinic keratosis: Secondary | ICD-10-CM | POA: Diagnosis not present

## 2016-09-18 DIAGNOSIS — I10 Essential (primary) hypertension: Secondary | ICD-10-CM | POA: Diagnosis not present

## 2016-09-18 DIAGNOSIS — R600 Localized edema: Secondary | ICD-10-CM | POA: Diagnosis not present

## 2016-09-18 DIAGNOSIS — E118 Type 2 diabetes mellitus with unspecified complications: Secondary | ICD-10-CM | POA: Diagnosis not present

## 2016-09-18 DIAGNOSIS — E039 Hypothyroidism, unspecified: Secondary | ICD-10-CM | POA: Diagnosis not present

## 2016-09-19 DIAGNOSIS — M2010 Hallux valgus (acquired), unspecified foot: Secondary | ICD-10-CM | POA: Diagnosis not present

## 2016-09-19 DIAGNOSIS — L853 Xerosis cutis: Secondary | ICD-10-CM | POA: Diagnosis not present

## 2016-09-19 DIAGNOSIS — I739 Peripheral vascular disease, unspecified: Secondary | ICD-10-CM | POA: Diagnosis not present

## 2016-09-19 DIAGNOSIS — R600 Localized edema: Secondary | ICD-10-CM | POA: Diagnosis not present

## 2016-09-19 DIAGNOSIS — E118 Type 2 diabetes mellitus with unspecified complications: Secondary | ICD-10-CM | POA: Diagnosis not present

## 2016-09-20 DIAGNOSIS — G309 Alzheimer's disease, unspecified: Secondary | ICD-10-CM | POA: Diagnosis not present

## 2016-09-20 DIAGNOSIS — Z86711 Personal history of pulmonary embolism: Secondary | ICD-10-CM | POA: Diagnosis not present

## 2016-09-20 DIAGNOSIS — J449 Chronic obstructive pulmonary disease, unspecified: Secondary | ICD-10-CM | POA: Diagnosis not present

## 2016-09-20 DIAGNOSIS — S81802D Unspecified open wound, left lower leg, subsequent encounter: Secondary | ICD-10-CM | POA: Diagnosis not present

## 2016-09-20 DIAGNOSIS — E785 Hyperlipidemia, unspecified: Secondary | ICD-10-CM | POA: Diagnosis not present

## 2016-09-20 DIAGNOSIS — E119 Type 2 diabetes mellitus without complications: Secondary | ICD-10-CM | POA: Diagnosis not present

## 2016-09-24 DIAGNOSIS — S81802D Unspecified open wound, left lower leg, subsequent encounter: Secondary | ICD-10-CM | POA: Diagnosis not present

## 2016-09-24 DIAGNOSIS — J449 Chronic obstructive pulmonary disease, unspecified: Secondary | ICD-10-CM | POA: Diagnosis not present

## 2016-09-24 DIAGNOSIS — E119 Type 2 diabetes mellitus without complications: Secondary | ICD-10-CM | POA: Diagnosis not present

## 2016-09-24 DIAGNOSIS — Z86711 Personal history of pulmonary embolism: Secondary | ICD-10-CM | POA: Diagnosis not present

## 2016-09-24 DIAGNOSIS — E785 Hyperlipidemia, unspecified: Secondary | ICD-10-CM | POA: Diagnosis not present

## 2016-09-24 DIAGNOSIS — G309 Alzheimer's disease, unspecified: Secondary | ICD-10-CM | POA: Diagnosis not present

## 2016-09-26 DIAGNOSIS — G301 Alzheimer's disease with late onset: Secondary | ICD-10-CM | POA: Diagnosis not present

## 2016-09-28 DIAGNOSIS — E785 Hyperlipidemia, unspecified: Secondary | ICD-10-CM | POA: Diagnosis not present

## 2016-09-28 DIAGNOSIS — E782 Mixed hyperlipidemia: Secondary | ICD-10-CM | POA: Diagnosis not present

## 2016-09-28 DIAGNOSIS — G309 Alzheimer's disease, unspecified: Secondary | ICD-10-CM | POA: Diagnosis not present

## 2016-09-28 DIAGNOSIS — J449 Chronic obstructive pulmonary disease, unspecified: Secondary | ICD-10-CM | POA: Diagnosis not present

## 2016-09-28 DIAGNOSIS — S81802D Unspecified open wound, left lower leg, subsequent encounter: Secondary | ICD-10-CM | POA: Diagnosis not present

## 2016-09-28 DIAGNOSIS — E039 Hypothyroidism, unspecified: Secondary | ICD-10-CM | POA: Diagnosis not present

## 2016-09-28 DIAGNOSIS — Z86711 Personal history of pulmonary embolism: Secondary | ICD-10-CM | POA: Diagnosis not present

## 2016-09-28 DIAGNOSIS — I10 Essential (primary) hypertension: Secondary | ICD-10-CM | POA: Diagnosis not present

## 2016-09-28 DIAGNOSIS — E119 Type 2 diabetes mellitus without complications: Secondary | ICD-10-CM | POA: Diagnosis not present

## 2016-10-01 DIAGNOSIS — G309 Alzheimer's disease, unspecified: Secondary | ICD-10-CM | POA: Diagnosis not present

## 2016-10-01 DIAGNOSIS — J449 Chronic obstructive pulmonary disease, unspecified: Secondary | ICD-10-CM | POA: Diagnosis not present

## 2016-10-01 DIAGNOSIS — E119 Type 2 diabetes mellitus without complications: Secondary | ICD-10-CM | POA: Diagnosis not present

## 2016-10-01 DIAGNOSIS — E785 Hyperlipidemia, unspecified: Secondary | ICD-10-CM | POA: Diagnosis not present

## 2016-10-01 DIAGNOSIS — S81802D Unspecified open wound, left lower leg, subsequent encounter: Secondary | ICD-10-CM | POA: Diagnosis not present

## 2016-10-01 DIAGNOSIS — Z86711 Personal history of pulmonary embolism: Secondary | ICD-10-CM | POA: Diagnosis not present

## 2016-10-04 DIAGNOSIS — S81802D Unspecified open wound, left lower leg, subsequent encounter: Secondary | ICD-10-CM | POA: Diagnosis not present

## 2016-10-04 DIAGNOSIS — G309 Alzheimer's disease, unspecified: Secondary | ICD-10-CM | POA: Diagnosis not present

## 2016-10-04 DIAGNOSIS — Z86711 Personal history of pulmonary embolism: Secondary | ICD-10-CM | POA: Diagnosis not present

## 2016-10-04 DIAGNOSIS — E119 Type 2 diabetes mellitus without complications: Secondary | ICD-10-CM | POA: Diagnosis not present

## 2016-10-04 DIAGNOSIS — E785 Hyperlipidemia, unspecified: Secondary | ICD-10-CM | POA: Diagnosis not present

## 2016-10-04 DIAGNOSIS — J449 Chronic obstructive pulmonary disease, unspecified: Secondary | ICD-10-CM | POA: Diagnosis not present

## 2016-10-08 DIAGNOSIS — E119 Type 2 diabetes mellitus without complications: Secondary | ICD-10-CM | POA: Diagnosis not present

## 2016-10-08 DIAGNOSIS — E785 Hyperlipidemia, unspecified: Secondary | ICD-10-CM | POA: Diagnosis not present

## 2016-10-08 DIAGNOSIS — S81802D Unspecified open wound, left lower leg, subsequent encounter: Secondary | ICD-10-CM | POA: Diagnosis not present

## 2016-10-08 DIAGNOSIS — J449 Chronic obstructive pulmonary disease, unspecified: Secondary | ICD-10-CM | POA: Diagnosis not present

## 2016-10-08 DIAGNOSIS — G309 Alzheimer's disease, unspecified: Secondary | ICD-10-CM | POA: Diagnosis not present

## 2016-10-08 DIAGNOSIS — Z86711 Personal history of pulmonary embolism: Secondary | ICD-10-CM | POA: Diagnosis not present

## 2016-10-09 DIAGNOSIS — L309 Dermatitis, unspecified: Secondary | ICD-10-CM | POA: Diagnosis not present

## 2016-10-11 DIAGNOSIS — E119 Type 2 diabetes mellitus without complications: Secondary | ICD-10-CM | POA: Diagnosis not present

## 2016-10-11 DIAGNOSIS — S81802D Unspecified open wound, left lower leg, subsequent encounter: Secondary | ICD-10-CM | POA: Diagnosis not present

## 2016-10-11 DIAGNOSIS — E785 Hyperlipidemia, unspecified: Secondary | ICD-10-CM | POA: Diagnosis not present

## 2016-10-11 DIAGNOSIS — J449 Chronic obstructive pulmonary disease, unspecified: Secondary | ICD-10-CM | POA: Diagnosis not present

## 2016-10-11 DIAGNOSIS — G309 Alzheimer's disease, unspecified: Secondary | ICD-10-CM | POA: Diagnosis not present

## 2016-10-11 DIAGNOSIS — Z86711 Personal history of pulmonary embolism: Secondary | ICD-10-CM | POA: Diagnosis not present

## 2016-10-15 DIAGNOSIS — G309 Alzheimer's disease, unspecified: Secondary | ICD-10-CM | POA: Diagnosis not present

## 2016-10-15 DIAGNOSIS — S81802D Unspecified open wound, left lower leg, subsequent encounter: Secondary | ICD-10-CM | POA: Diagnosis not present

## 2016-10-15 DIAGNOSIS — E785 Hyperlipidemia, unspecified: Secondary | ICD-10-CM | POA: Diagnosis not present

## 2016-10-15 DIAGNOSIS — Z86711 Personal history of pulmonary embolism: Secondary | ICD-10-CM | POA: Diagnosis not present

## 2016-10-15 DIAGNOSIS — E119 Type 2 diabetes mellitus without complications: Secondary | ICD-10-CM | POA: Diagnosis not present

## 2016-10-15 DIAGNOSIS — J449 Chronic obstructive pulmonary disease, unspecified: Secondary | ICD-10-CM | POA: Diagnosis not present

## 2016-10-16 DIAGNOSIS — I10 Essential (primary) hypertension: Secondary | ICD-10-CM | POA: Diagnosis not present

## 2016-10-16 DIAGNOSIS — R509 Fever, unspecified: Secondary | ICD-10-CM | POA: Diagnosis not present

## 2016-10-16 DIAGNOSIS — R600 Localized edema: Secondary | ICD-10-CM | POA: Diagnosis not present

## 2016-10-16 DIAGNOSIS — R531 Weakness: Secondary | ICD-10-CM | POA: Diagnosis not present

## 2016-10-16 DIAGNOSIS — R05 Cough: Secondary | ICD-10-CM | POA: Diagnosis not present

## 2016-10-16 DIAGNOSIS — M84459S Pathological fracture, hip, unspecified, sequela: Secondary | ICD-10-CM | POA: Diagnosis not present

## 2016-10-16 DIAGNOSIS — Z8673 Personal history of transient ischemic attack (TIA), and cerebral infarction without residual deficits: Secondary | ICD-10-CM | POA: Diagnosis not present

## 2016-10-17 DIAGNOSIS — Z86711 Personal history of pulmonary embolism: Secondary | ICD-10-CM | POA: Diagnosis not present

## 2016-10-17 DIAGNOSIS — N390 Urinary tract infection, site not specified: Secondary | ICD-10-CM | POA: Diagnosis not present

## 2016-10-17 DIAGNOSIS — E785 Hyperlipidemia, unspecified: Secondary | ICD-10-CM | POA: Diagnosis not present

## 2016-10-17 DIAGNOSIS — G309 Alzheimer's disease, unspecified: Secondary | ICD-10-CM | POA: Diagnosis not present

## 2016-10-17 DIAGNOSIS — S81802D Unspecified open wound, left lower leg, subsequent encounter: Secondary | ICD-10-CM | POA: Diagnosis not present

## 2016-10-17 DIAGNOSIS — E119 Type 2 diabetes mellitus without complications: Secondary | ICD-10-CM | POA: Diagnosis not present

## 2016-10-17 DIAGNOSIS — J449 Chronic obstructive pulmonary disease, unspecified: Secondary | ICD-10-CM | POA: Diagnosis not present

## 2016-10-18 DIAGNOSIS — R531 Weakness: Secondary | ICD-10-CM | POA: Diagnosis not present

## 2016-10-19 DIAGNOSIS — E119 Type 2 diabetes mellitus without complications: Secondary | ICD-10-CM | POA: Diagnosis not present

## 2016-10-19 DIAGNOSIS — S81802D Unspecified open wound, left lower leg, subsequent encounter: Secondary | ICD-10-CM | POA: Diagnosis not present

## 2016-10-19 DIAGNOSIS — G309 Alzheimer's disease, unspecified: Secondary | ICD-10-CM | POA: Diagnosis not present

## 2016-10-19 DIAGNOSIS — J449 Chronic obstructive pulmonary disease, unspecified: Secondary | ICD-10-CM | POA: Diagnosis not present

## 2016-10-19 DIAGNOSIS — Z86711 Personal history of pulmonary embolism: Secondary | ICD-10-CM | POA: Diagnosis not present

## 2016-10-19 DIAGNOSIS — E785 Hyperlipidemia, unspecified: Secondary | ICD-10-CM | POA: Diagnosis not present

## 2016-10-22 DIAGNOSIS — Z86711 Personal history of pulmonary embolism: Secondary | ICD-10-CM | POA: Diagnosis not present

## 2016-10-22 DIAGNOSIS — E119 Type 2 diabetes mellitus without complications: Secondary | ICD-10-CM | POA: Diagnosis not present

## 2016-10-22 DIAGNOSIS — J449 Chronic obstructive pulmonary disease, unspecified: Secondary | ICD-10-CM | POA: Diagnosis not present

## 2016-10-22 DIAGNOSIS — S81802D Unspecified open wound, left lower leg, subsequent encounter: Secondary | ICD-10-CM | POA: Diagnosis not present

## 2016-10-22 DIAGNOSIS — E785 Hyperlipidemia, unspecified: Secondary | ICD-10-CM | POA: Diagnosis not present

## 2016-10-22 DIAGNOSIS — G309 Alzheimer's disease, unspecified: Secondary | ICD-10-CM | POA: Diagnosis not present

## 2016-10-23 DIAGNOSIS — E119 Type 2 diabetes mellitus without complications: Secondary | ICD-10-CM | POA: Diagnosis not present

## 2016-10-23 DIAGNOSIS — S81802D Unspecified open wound, left lower leg, subsequent encounter: Secondary | ICD-10-CM | POA: Diagnosis not present

## 2016-10-23 DIAGNOSIS — J449 Chronic obstructive pulmonary disease, unspecified: Secondary | ICD-10-CM | POA: Diagnosis not present

## 2016-10-23 DIAGNOSIS — Z86711 Personal history of pulmonary embolism: Secondary | ICD-10-CM | POA: Diagnosis not present

## 2016-10-23 DIAGNOSIS — J159 Unspecified bacterial pneumonia: Secondary | ICD-10-CM | POA: Diagnosis not present

## 2016-10-23 DIAGNOSIS — E785 Hyperlipidemia, unspecified: Secondary | ICD-10-CM | POA: Diagnosis not present

## 2016-10-23 DIAGNOSIS — G309 Alzheimer's disease, unspecified: Secondary | ICD-10-CM | POA: Diagnosis not present

## 2016-10-23 DIAGNOSIS — M150 Primary generalized (osteo)arthritis: Secondary | ICD-10-CM | POA: Diagnosis not present

## 2016-10-23 DIAGNOSIS — G301 Alzheimer's disease with late onset: Secondary | ICD-10-CM | POA: Diagnosis not present

## 2016-10-24 DIAGNOSIS — M25551 Pain in right hip: Secondary | ICD-10-CM | POA: Diagnosis not present

## 2016-10-25 DIAGNOSIS — G309 Alzheimer's disease, unspecified: Secondary | ICD-10-CM | POA: Diagnosis not present

## 2016-10-25 DIAGNOSIS — E785 Hyperlipidemia, unspecified: Secondary | ICD-10-CM | POA: Diagnosis not present

## 2016-10-25 DIAGNOSIS — E119 Type 2 diabetes mellitus without complications: Secondary | ICD-10-CM | POA: Diagnosis not present

## 2016-10-25 DIAGNOSIS — S81802D Unspecified open wound, left lower leg, subsequent encounter: Secondary | ICD-10-CM | POA: Diagnosis not present

## 2016-10-25 DIAGNOSIS — Z86711 Personal history of pulmonary embolism: Secondary | ICD-10-CM | POA: Diagnosis not present

## 2016-10-25 DIAGNOSIS — J449 Chronic obstructive pulmonary disease, unspecified: Secondary | ICD-10-CM | POA: Diagnosis not present

## 2016-10-25 DIAGNOSIS — R531 Weakness: Secondary | ICD-10-CM | POA: Diagnosis not present

## 2016-11-13 DIAGNOSIS — R21 Rash and other nonspecific skin eruption: Secondary | ICD-10-CM | POA: Diagnosis not present

## 2016-11-13 DIAGNOSIS — Z8673 Personal history of transient ischemic attack (TIA), and cerebral infarction without residual deficits: Secondary | ICD-10-CM | POA: Diagnosis not present

## 2016-11-13 DIAGNOSIS — J309 Allergic rhinitis, unspecified: Secondary | ICD-10-CM | POA: Diagnosis not present

## 2016-11-13 DIAGNOSIS — R05 Cough: Secondary | ICD-10-CM | POA: Diagnosis not present

## 2016-11-13 DIAGNOSIS — E118 Type 2 diabetes mellitus with unspecified complications: Secondary | ICD-10-CM | POA: Diagnosis not present

## 2016-11-26 DIAGNOSIS — G301 Alzheimer's disease with late onset: Secondary | ICD-10-CM | POA: Diagnosis not present

## 2016-12-11 DIAGNOSIS — E118 Type 2 diabetes mellitus with unspecified complications: Secondary | ICD-10-CM | POA: Diagnosis not present

## 2016-12-11 DIAGNOSIS — J309 Allergic rhinitis, unspecified: Secondary | ICD-10-CM | POA: Diagnosis not present

## 2016-12-11 DIAGNOSIS — R21 Rash and other nonspecific skin eruption: Secondary | ICD-10-CM | POA: Diagnosis not present

## 2016-12-11 DIAGNOSIS — E039 Hypothyroidism, unspecified: Secondary | ICD-10-CM | POA: Diagnosis not present

## 2016-12-11 DIAGNOSIS — G894 Chronic pain syndrome: Secondary | ICD-10-CM | POA: Diagnosis not present

## 2016-12-19 DIAGNOSIS — E118 Type 2 diabetes mellitus with unspecified complications: Secondary | ICD-10-CM | POA: Diagnosis not present

## 2016-12-19 DIAGNOSIS — L853 Xerosis cutis: Secondary | ICD-10-CM | POA: Diagnosis not present

## 2016-12-19 DIAGNOSIS — R600 Localized edema: Secondary | ICD-10-CM | POA: Diagnosis not present

## 2016-12-19 DIAGNOSIS — M2010 Hallux valgus (acquired), unspecified foot: Secondary | ICD-10-CM | POA: Diagnosis not present

## 2016-12-19 DIAGNOSIS — I739 Peripheral vascular disease, unspecified: Secondary | ICD-10-CM | POA: Diagnosis not present

## 2016-12-27 DIAGNOSIS — G301 Alzheimer's disease with late onset: Secondary | ICD-10-CM | POA: Diagnosis not present

## 2017-01-08 DIAGNOSIS — E039 Hypothyroidism, unspecified: Secondary | ICD-10-CM | POA: Diagnosis not present

## 2017-01-08 DIAGNOSIS — I10 Essential (primary) hypertension: Secondary | ICD-10-CM | POA: Diagnosis not present

## 2017-01-08 DIAGNOSIS — Z8673 Personal history of transient ischemic attack (TIA), and cerebral infarction without residual deficits: Secondary | ICD-10-CM | POA: Diagnosis not present

## 2017-01-08 DIAGNOSIS — J309 Allergic rhinitis, unspecified: Secondary | ICD-10-CM | POA: Diagnosis not present

## 2017-01-08 DIAGNOSIS — E118 Type 2 diabetes mellitus with unspecified complications: Secondary | ICD-10-CM | POA: Diagnosis not present

## 2017-02-28 DIAGNOSIS — G301 Alzheimer's disease with late onset: Secondary | ICD-10-CM | POA: Diagnosis not present

## 2017-03-05 DIAGNOSIS — J449 Chronic obstructive pulmonary disease, unspecified: Secondary | ICD-10-CM | POA: Diagnosis not present

## 2017-03-05 DIAGNOSIS — J309 Allergic rhinitis, unspecified: Secondary | ICD-10-CM | POA: Diagnosis not present

## 2017-03-05 DIAGNOSIS — K5900 Constipation, unspecified: Secondary | ICD-10-CM | POA: Diagnosis not present

## 2017-03-05 DIAGNOSIS — Z8673 Personal history of transient ischemic attack (TIA), and cerebral infarction without residual deficits: Secondary | ICD-10-CM | POA: Diagnosis not present

## 2017-03-05 DIAGNOSIS — I10 Essential (primary) hypertension: Secondary | ICD-10-CM | POA: Diagnosis not present

## 2017-05-28 DIAGNOSIS — G894 Chronic pain syndrome: Secondary | ICD-10-CM | POA: Diagnosis not present

## 2017-05-28 DIAGNOSIS — Z8673 Personal history of transient ischemic attack (TIA), and cerebral infarction without residual deficits: Secondary | ICD-10-CM | POA: Diagnosis not present

## 2017-05-28 DIAGNOSIS — K5900 Constipation, unspecified: Secondary | ICD-10-CM | POA: Diagnosis not present

## 2017-05-28 DIAGNOSIS — I10 Essential (primary) hypertension: Secondary | ICD-10-CM | POA: Diagnosis not present

## 2017-05-28 DIAGNOSIS — J309 Allergic rhinitis, unspecified: Secondary | ICD-10-CM | POA: Diagnosis not present

## 2017-06-11 DIAGNOSIS — J309 Allergic rhinitis, unspecified: Secondary | ICD-10-CM | POA: Diagnosis not present

## 2017-06-11 DIAGNOSIS — M84459S Pathological fracture, hip, unspecified, sequela: Secondary | ICD-10-CM | POA: Diagnosis not present

## 2017-06-11 DIAGNOSIS — K5900 Constipation, unspecified: Secondary | ICD-10-CM | POA: Diagnosis not present

## 2017-06-11 DIAGNOSIS — G894 Chronic pain syndrome: Secondary | ICD-10-CM | POA: Diagnosis not present

## 2017-06-11 DIAGNOSIS — E118 Type 2 diabetes mellitus with unspecified complications: Secondary | ICD-10-CM | POA: Diagnosis not present

## 2017-11-26 DIAGNOSIS — J019 Acute sinusitis, unspecified: Secondary | ICD-10-CM | POA: Diagnosis not present

## 2017-11-26 DIAGNOSIS — J449 Chronic obstructive pulmonary disease, unspecified: Secondary | ICD-10-CM | POA: Diagnosis not present

## 2017-11-26 DIAGNOSIS — E118 Type 2 diabetes mellitus with unspecified complications: Secondary | ICD-10-CM | POA: Diagnosis not present

## 2017-11-26 DIAGNOSIS — J309 Allergic rhinitis, unspecified: Secondary | ICD-10-CM | POA: Diagnosis not present

## 2017-11-26 DIAGNOSIS — I10 Essential (primary) hypertension: Secondary | ICD-10-CM | POA: Diagnosis not present

## 2017-12-11 IMAGING — CT CT CERVICAL SPINE W/O CM
3 of 9 series · 10 of 33 positions shown, 11 images · non-contrast
Comparison: None.

CLINICAL DATA: Pain after fall.

EXAM:
CT CERVICAL SPINE WITHOUT CONTRAST
TECHNIQUE: Multidetector CT imaging of the cervical spine was performed without
intravenous contrast. Multiplanar CT image reconstructions were also
generated.

[Series 305: coronal mprs · coronal · 0.34mm/px · 3 of 57 slices shown]
[im 15/57  bone]
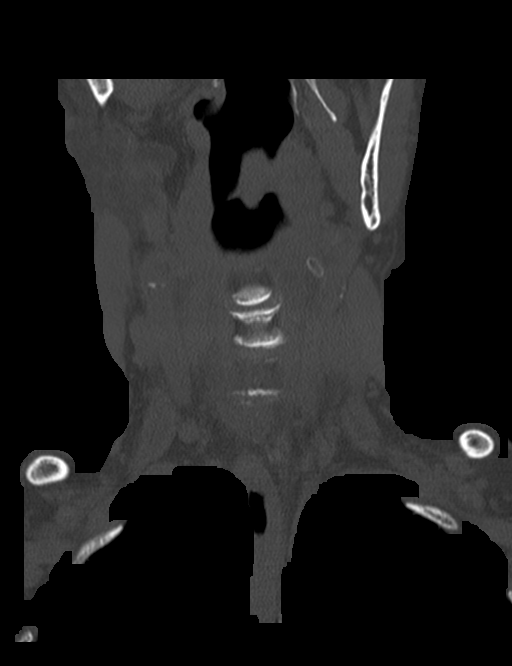
[im 29/57  bone]
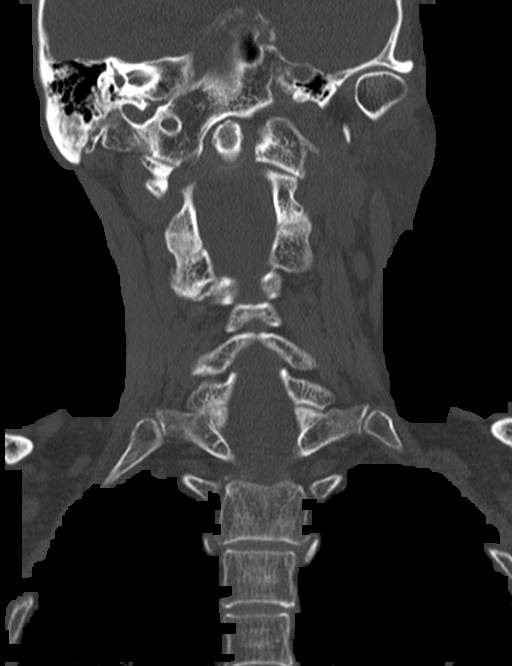
[im 43/57  bone]
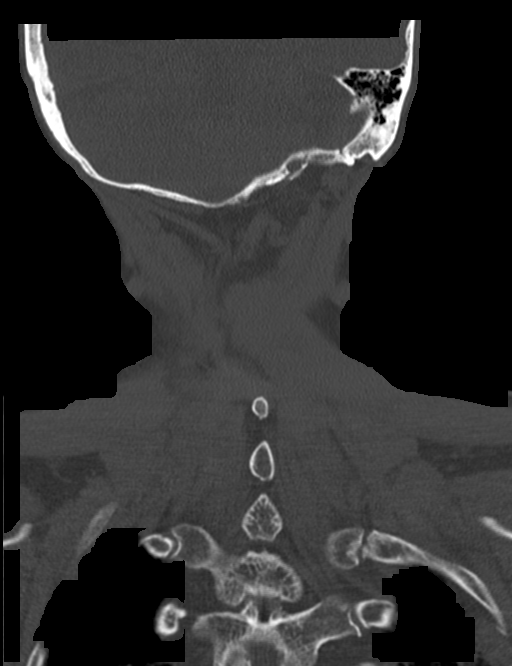

[Series 306: sagittal · sagittal · 0.34mm/px · 4 of 48 slices shown]
[im 10/48  bone]
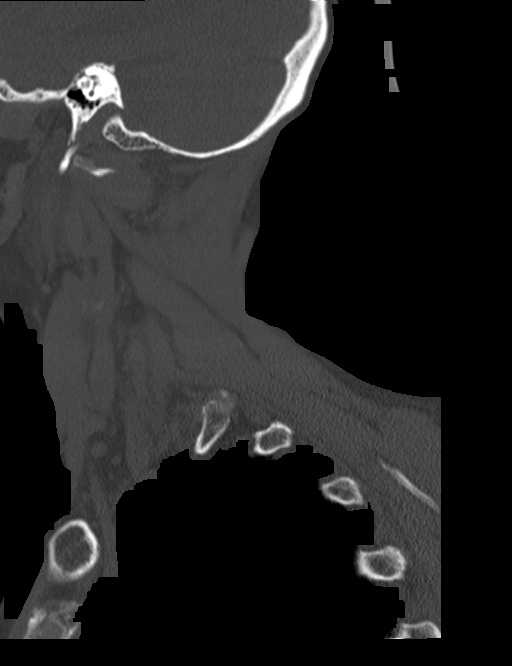
[im 19/48  bone]
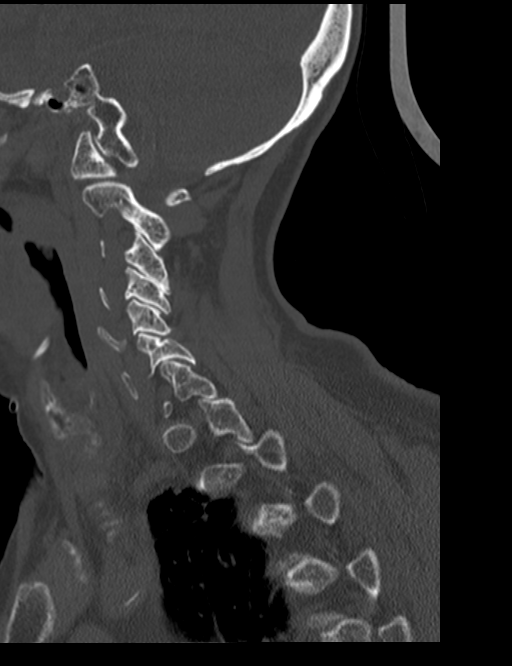
[im 29/48  bone]
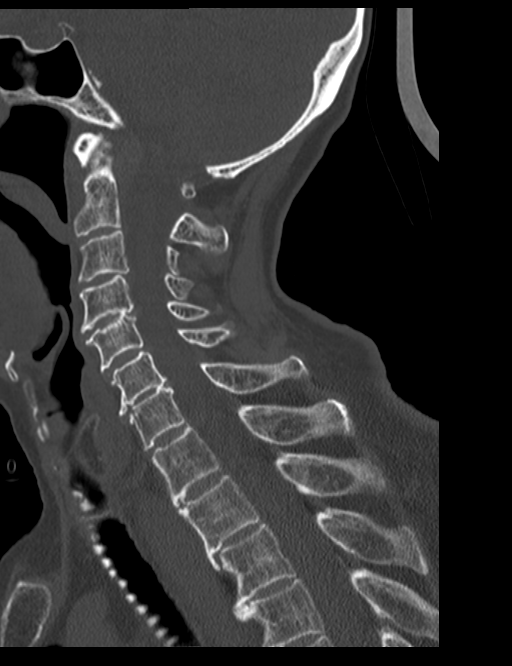
[im 38/48  bone]
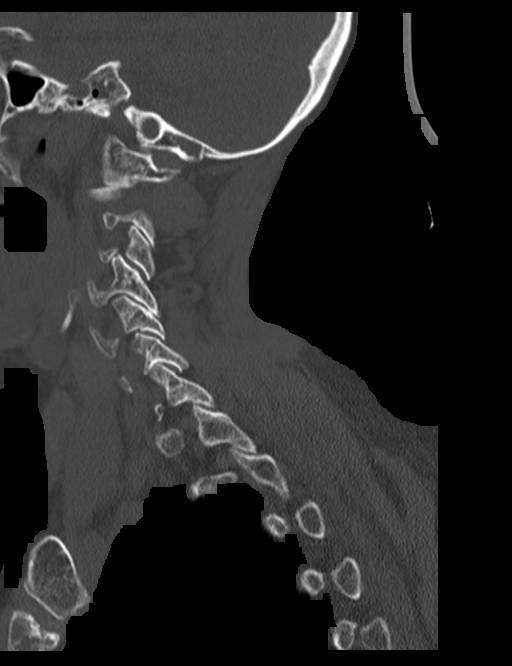

[Series 307: orthogonals · axial · 0.34mm/px · z∈[+679,+772]mm · 3 of 95 slices shown, 4 images]
[im 24/95  soft-tissue]
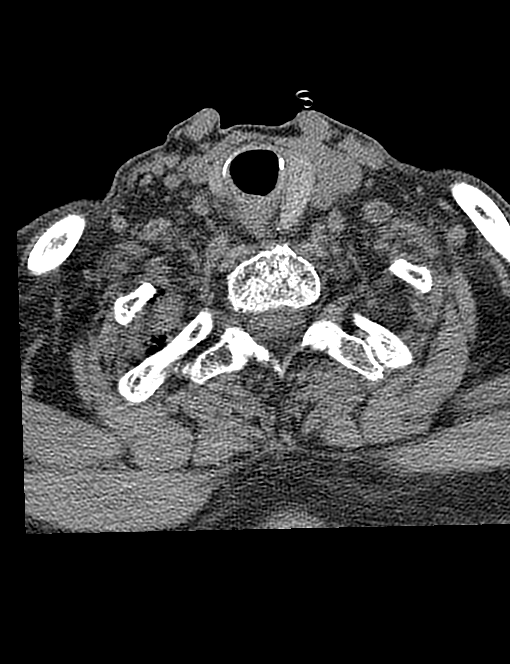
[im 24/95  bone]
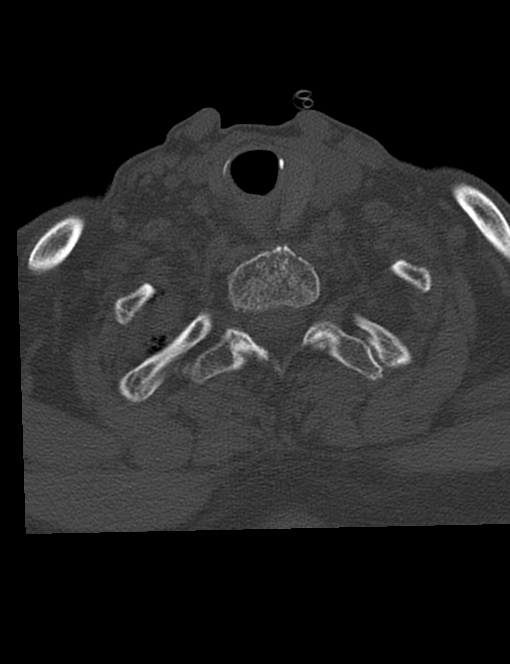
[im 48/95  bone]
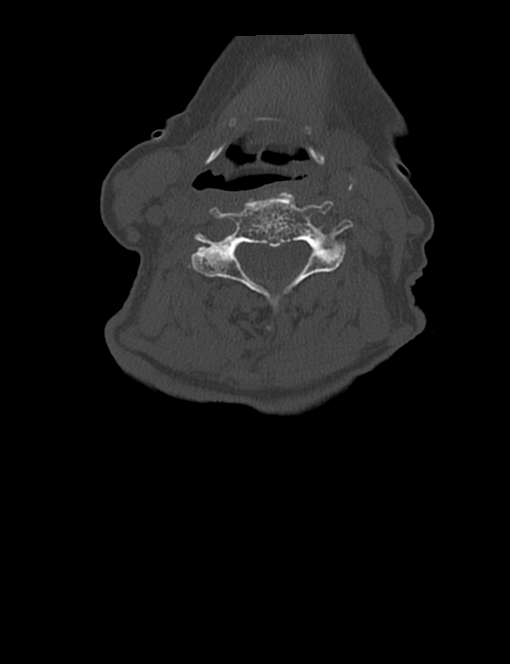
[im 71/95  bone]
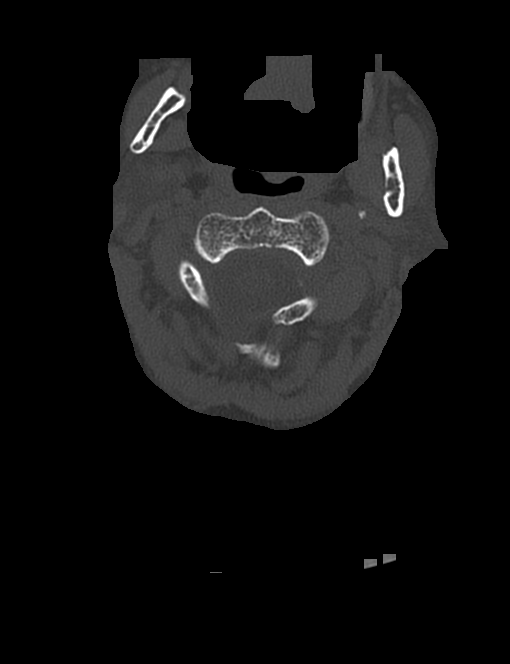

[10 of 33 positions shown; findings below may reference images not displayed]

FINDINGS: No fracture or traumatic malalignment. The posterior arch of C1 is
congenitally unfused. Multilevel degenerative changes are noted.
IMPRESSION: No fracture or traumatic malalignment.  Degenerative change.

## 2017-12-11 IMAGING — DX DG HIP (WITH OR WITHOUT PELVIS) 2-3V*L*
3 series · 3 of 3 positions shown · non-contrast
Comparison: None. The mobile x-ray is not available for comparison.

CLINICAL DATA: 86-year-old [HOSPITAL] patient who is unresponsive
but was found to have a left hip fracture on a mobile x-ray
examination earlier today.

EXAM:
DG HIP (WITH OR WITHOUT PELVIS) 2-3V LEFT

[t pelvis ap]
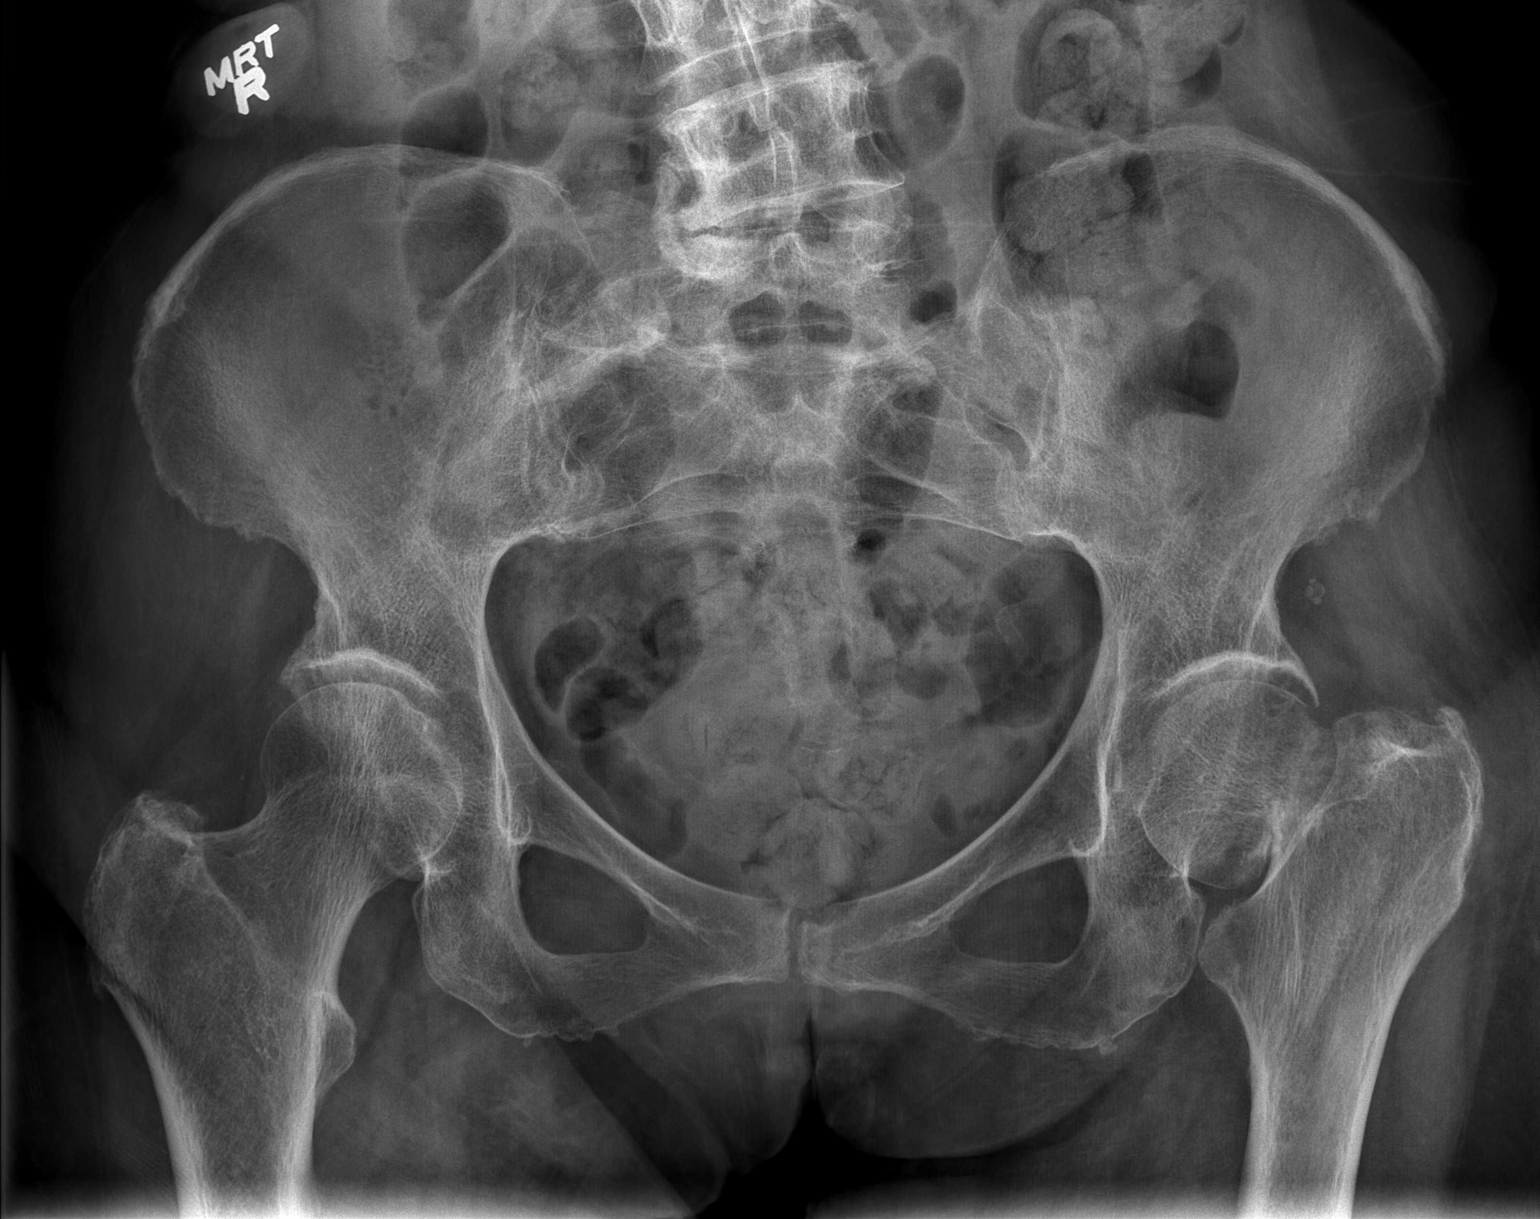

[t hip ap left]
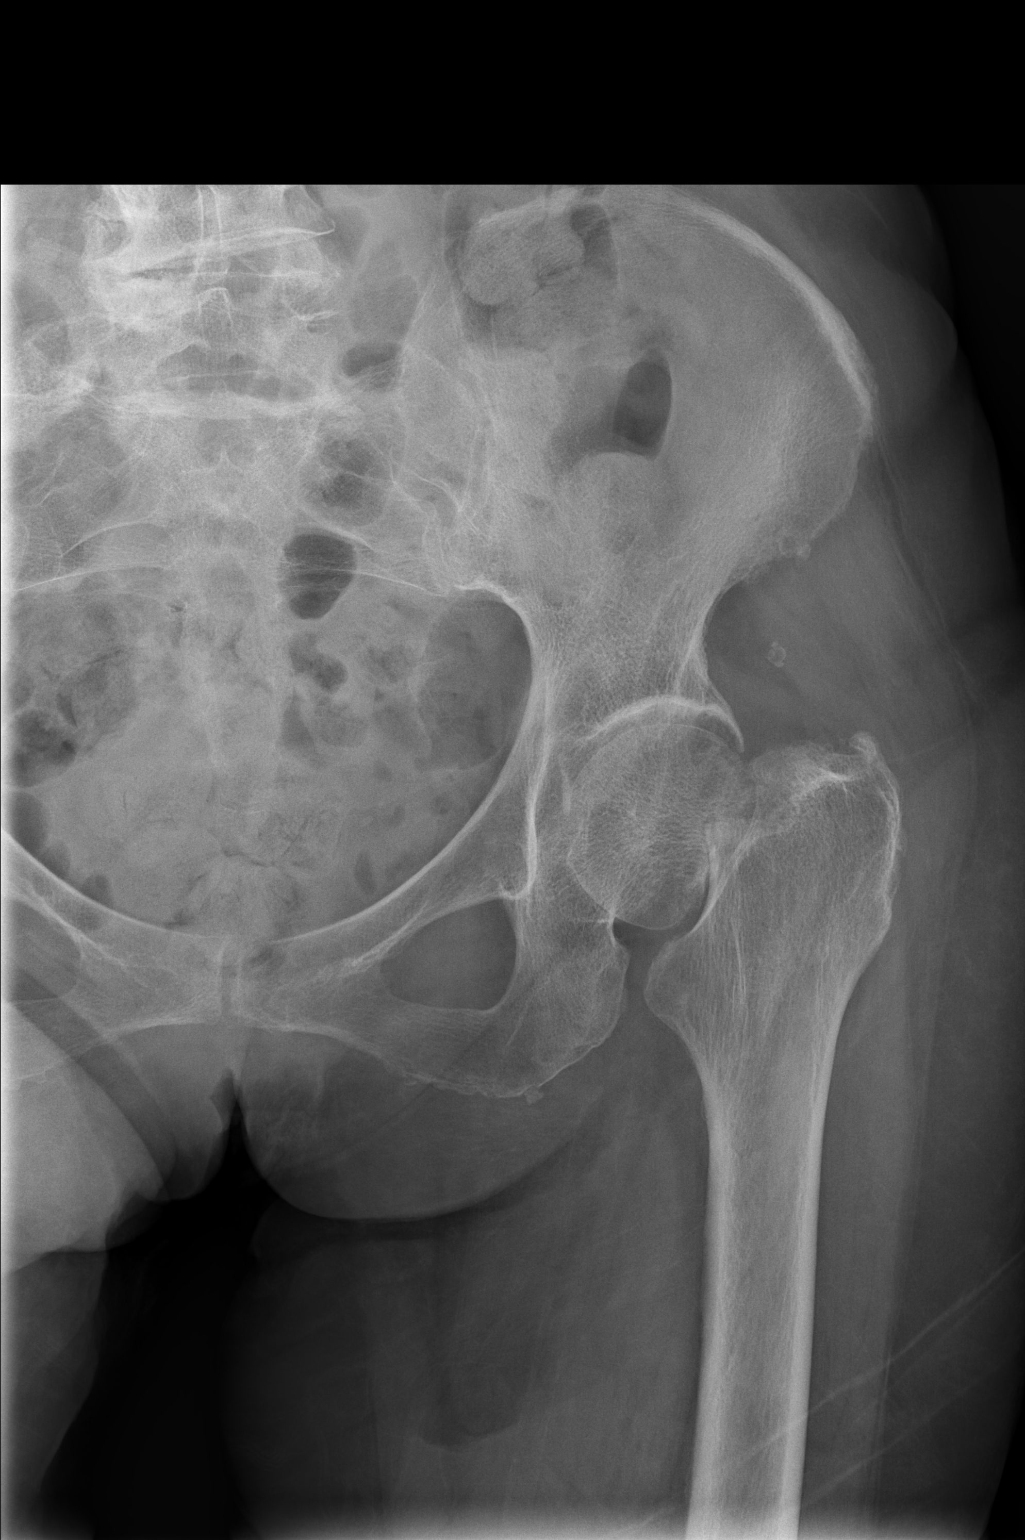

[w hip lat left]
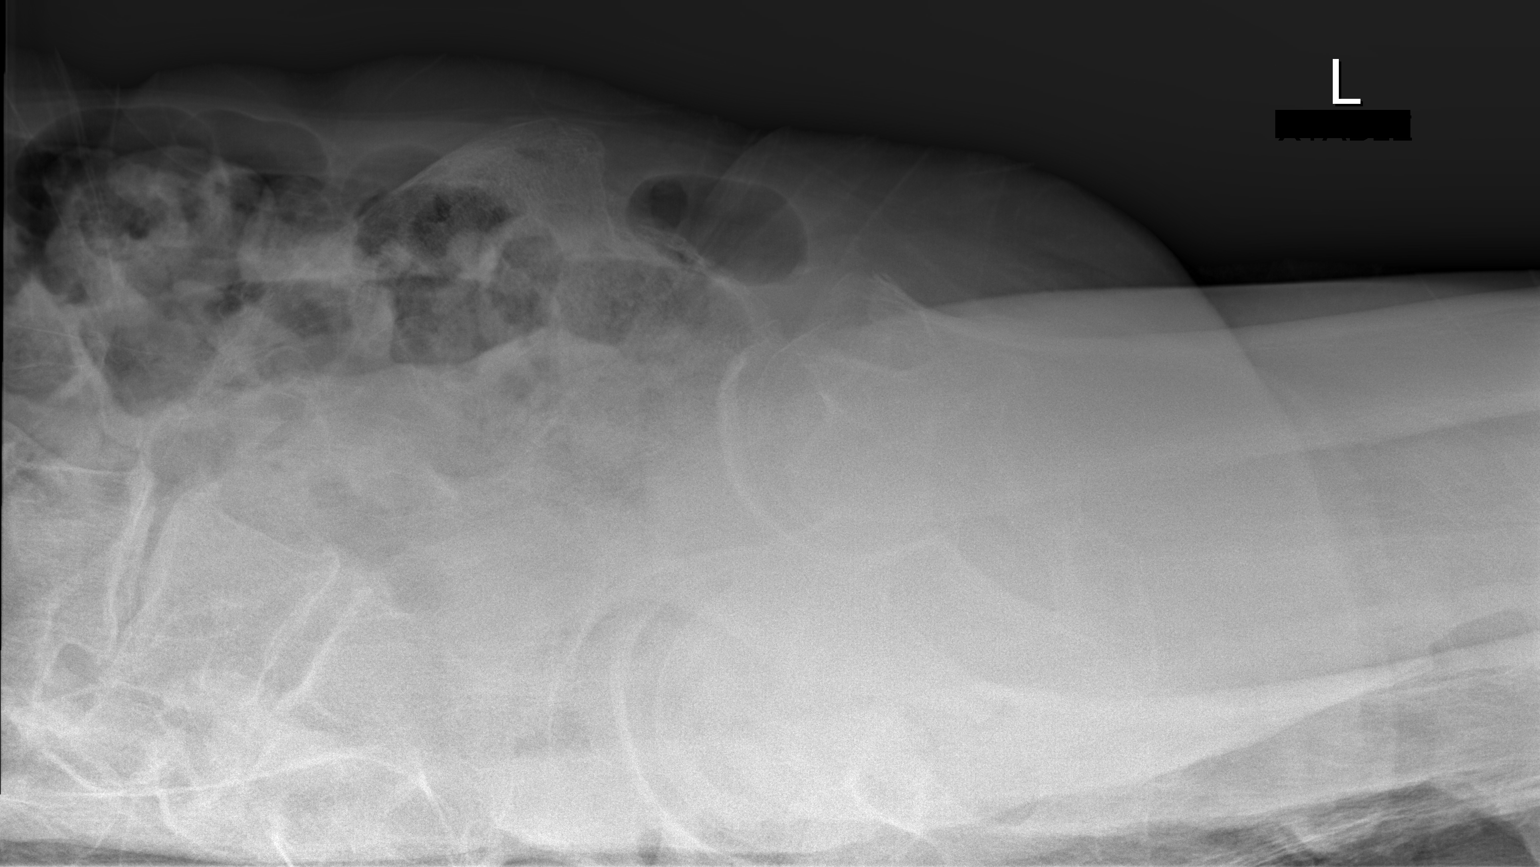

[3 of 3 positions shown; findings below may reference images not displayed]

FINDINGS: Acute comminuted subcapital left femoral neck fracture. Hip joint
intact with mild joint space narrowing.

Included AP pelvis demonstrates no fractures elsewhere. Symmetric
mild joint space narrowing in the contralateral right hip.
Sacroiliac joints symphysis pubis intact. Degenerative changes
involving the visualized lower lumbar spine.
IMPRESSION: Acute comminuted subcapital left femoral neck fracture.

## 2017-12-13 IMAGING — RF DG HIP (WITH PELVIS) OPERATIVE*L*
1 series · 2 of 2 positions shown · non-contrast
Comparison: 07/26/2015

CLINICAL DATA: IAS.6 ([id number]) - Surgery, elective Fluoro 24
seconds

EXAM:
OPERATIVE LEFT HIP (WITH PELVIS IF PERFORMED) 2 VIEWS
TECHNIQUE: Fluoroscopic spot image(s) were submitted for interpretation
post-operatively.

[Series 1: run · 2 of 2 slices shown]
[im 1/2]
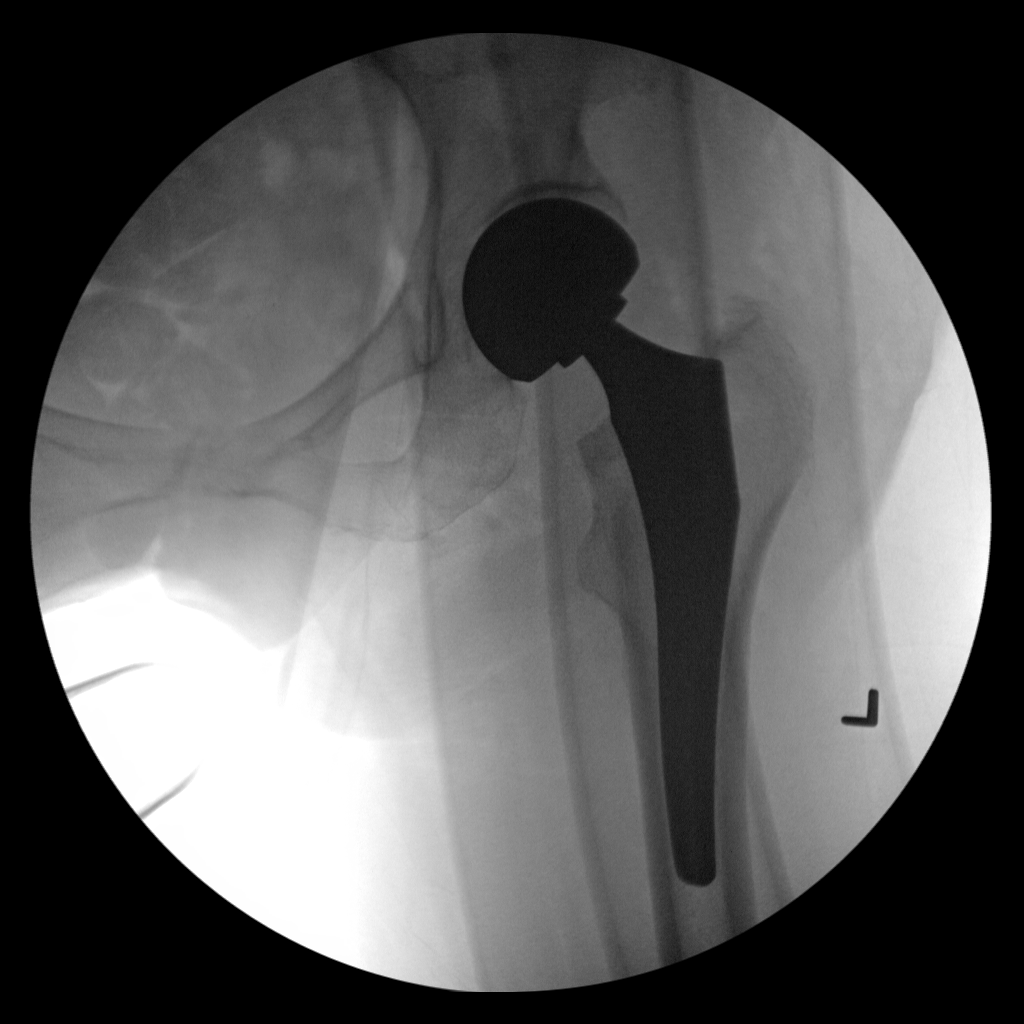
[im 2/2]
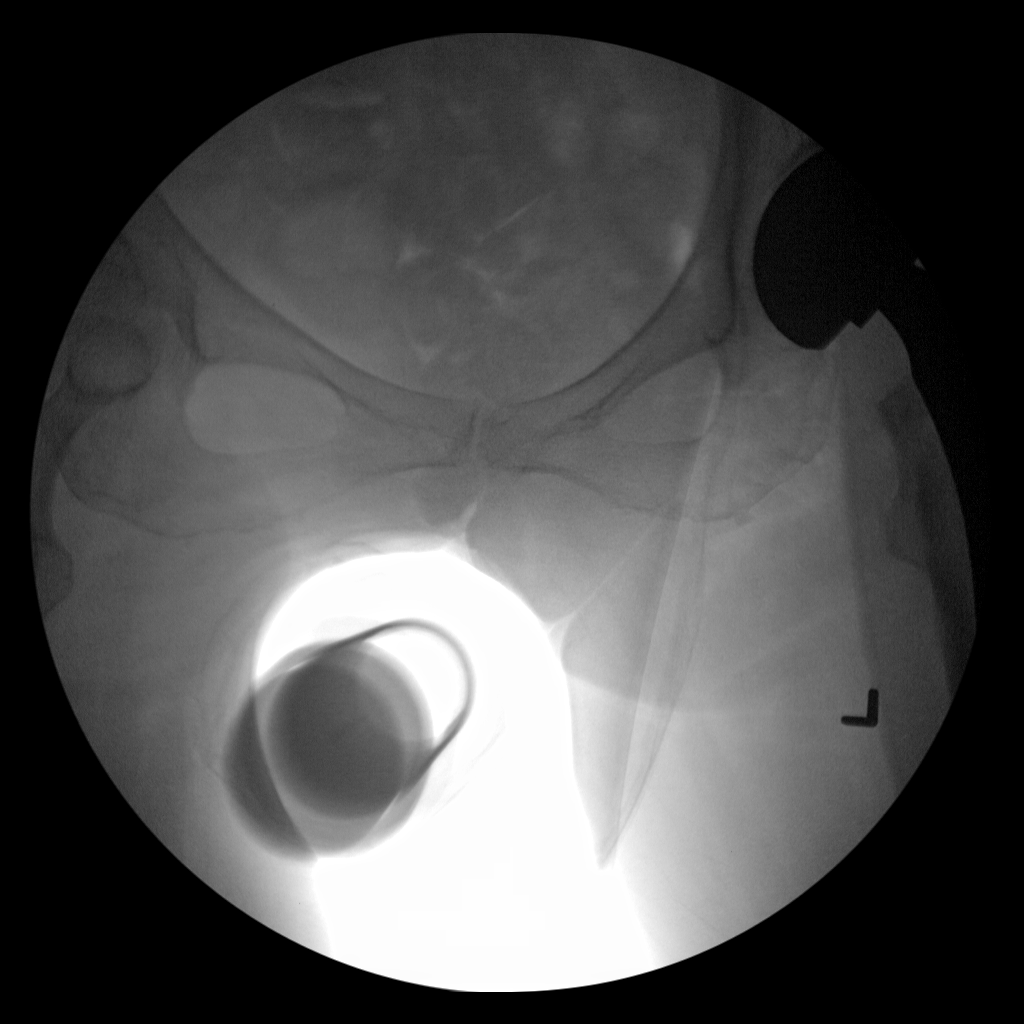

[2 of 2 positions shown; findings below may reference images not displayed]

FINDINGS: The patient has undergone left hip hemi arthroplasty. The femoral
head component appears well seated in the acetabulum. No interval
fracture.
IMPRESSION: Status post left hip arthroplasty.

## 2018-06-17 DIAGNOSIS — J449 Chronic obstructive pulmonary disease, unspecified: Secondary | ICD-10-CM | POA: Diagnosis not present

## 2018-06-17 DIAGNOSIS — K5900 Constipation, unspecified: Secondary | ICD-10-CM | POA: Diagnosis not present

## 2018-06-17 DIAGNOSIS — Z8673 Personal history of transient ischemic attack (TIA), and cerebral infarction without residual deficits: Secondary | ICD-10-CM | POA: Diagnosis not present

## 2018-06-17 DIAGNOSIS — R600 Localized edema: Secondary | ICD-10-CM | POA: Diagnosis not present

## 2018-06-17 DIAGNOSIS — E039 Hypothyroidism, unspecified: Secondary | ICD-10-CM | POA: Diagnosis not present

## 2018-07-22 DIAGNOSIS — F5109 Other insomnia not due to a substance or known physiological condition: Secondary | ICD-10-CM | POA: Diagnosis not present

## 2018-07-22 DIAGNOSIS — G301 Alzheimer's disease with late onset: Secondary | ICD-10-CM | POA: Diagnosis not present

## 2018-09-09 DIAGNOSIS — L603 Nail dystrophy: Secondary | ICD-10-CM | POA: Diagnosis not present

## 2018-09-09 DIAGNOSIS — Q845 Enlarged and hypertrophic nails: Secondary | ICD-10-CM | POA: Diagnosis not present

## 2018-09-09 DIAGNOSIS — E119 Type 2 diabetes mellitus without complications: Secondary | ICD-10-CM | POA: Diagnosis not present

## 2018-09-09 DIAGNOSIS — I739 Peripheral vascular disease, unspecified: Secondary | ICD-10-CM | POA: Diagnosis not present

## 2018-09-16 DIAGNOSIS — G4701 Insomnia due to medical condition: Secondary | ICD-10-CM | POA: Diagnosis not present

## 2018-09-16 DIAGNOSIS — G301 Alzheimer's disease with late onset: Secondary | ICD-10-CM | POA: Diagnosis not present

## 2020-03-01 DEATH — deceased
